# Patient Record
Sex: Female | Born: 1945
Health system: Southern US, Community
[De-identification: ages and names within clinical notes are randomized; demographics above are authoritative.]

## PROBLEM LIST (undated history)

## (undated) DIAGNOSIS — I10 Essential (primary) hypertension: Secondary | ICD-10-CM

## (undated) DIAGNOSIS — R0602 Shortness of breath: Secondary | ICD-10-CM

## (undated) DIAGNOSIS — K760 Fatty (change of) liver, not elsewhere classified: Secondary | ICD-10-CM

## (undated) DIAGNOSIS — F32A Depression, unspecified: Secondary | ICD-10-CM

## (undated) DIAGNOSIS — IMO0002 Reserved for concepts with insufficient information to code with codable children: Secondary | ICD-10-CM

## (undated) DIAGNOSIS — M797 Fibromyalgia: Secondary | ICD-10-CM

## (undated) DIAGNOSIS — N6092 Unspecified benign mammary dysplasia of left breast: Secondary | ICD-10-CM

## (undated) DIAGNOSIS — I499 Cardiac arrhythmia, unspecified: Secondary | ICD-10-CM

## (undated) DIAGNOSIS — H0520 Unspecified exophthalmos: Secondary | ICD-10-CM

## (undated) DIAGNOSIS — F419 Anxiety disorder, unspecified: Secondary | ICD-10-CM

## (undated) DIAGNOSIS — C801 Malignant (primary) neoplasm, unspecified: Secondary | ICD-10-CM

## (undated) DIAGNOSIS — L719 Rosacea, unspecified: Secondary | ICD-10-CM

## (undated) DIAGNOSIS — G629 Polyneuropathy, unspecified: Secondary | ICD-10-CM

## (undated) DIAGNOSIS — R0789 Other chest pain: Secondary | ICD-10-CM

## (undated) DIAGNOSIS — J439 Emphysema, unspecified: Secondary | ICD-10-CM

## (undated) DIAGNOSIS — T8859XA Other complications of anesthesia, initial encounter: Secondary | ICD-10-CM

## (undated) DIAGNOSIS — F329 Major depressive disorder, single episode, unspecified: Secondary | ICD-10-CM

## (undated) DIAGNOSIS — T7840XA Allergy, unspecified, initial encounter: Secondary | ICD-10-CM

## (undated) DIAGNOSIS — K589 Irritable bowel syndrome without diarrhea: Secondary | ICD-10-CM

## (undated) DIAGNOSIS — J449 Chronic obstructive pulmonary disease, unspecified: Secondary | ICD-10-CM

## (undated) DIAGNOSIS — G8929 Other chronic pain: Secondary | ICD-10-CM

## (undated) DIAGNOSIS — E785 Hyperlipidemia, unspecified: Secondary | ICD-10-CM

## (undated) DIAGNOSIS — R002 Palpitations: Secondary | ICD-10-CM

## (undated) DIAGNOSIS — C541 Malignant neoplasm of endometrium: Secondary | ICD-10-CM

## (undated) DIAGNOSIS — D0590 Unspecified type of carcinoma in situ of unspecified breast: Secondary | ICD-10-CM

## (undated) DIAGNOSIS — R0781 Pleurodynia: Secondary | ICD-10-CM

## (undated) DIAGNOSIS — M549 Dorsalgia, unspecified: Secondary | ICD-10-CM

## (undated) DIAGNOSIS — K219 Gastro-esophageal reflux disease without esophagitis: Secondary | ICD-10-CM

## (undated) DIAGNOSIS — Z1239 Encounter for other screening for malignant neoplasm of breast: Principal | ICD-10-CM

## (undated) DIAGNOSIS — E0500 Thyrotoxicosis with diffuse goiter without thyrotoxic crisis or storm: Secondary | ICD-10-CM

## (undated) DIAGNOSIS — M1990 Unspecified osteoarthritis, unspecified site: Secondary | ICD-10-CM

## (undated) DIAGNOSIS — E039 Hypothyroidism, unspecified: Secondary | ICD-10-CM

## (undated) HISTORY — DX: Rosacea, unspecified: L71.9

## (undated) HISTORY — DX: Allergy, unspecified, initial encounter: T78.40XA

## (undated) HISTORY — PX: COLECTOMY: SHX59

## (undated) HISTORY — DX: Encounter for other screening for malignant neoplasm of breast: Z12.39

## (undated) HISTORY — PX: CHOLECYSTECTOMY: SHX55

## (undated) HISTORY — PX: APPENDECTOMY: SHX54

## (undated) HISTORY — DX: Reserved for concepts with insufficient information to code with codable children: IMO0002

## (undated) HISTORY — PX: WISDOM TOOTH EXTRACTION: SHX21

## (undated) HISTORY — PX: COLON SURGERY: SHX602

## (undated) HISTORY — DX: Unspecified exophthalmos: H05.20

## (undated) HISTORY — PX: BACK SURGERY: SHX140

## (undated) HISTORY — PX: EXCISIONAL HEMORRHOIDECTOMY: SHX1541

## (undated) HISTORY — PX: JOINT REPLACEMENT: SHX530

## (undated) HISTORY — PX: HYSTEROSCOPY: SHX211

## (undated) HISTORY — DX: Emphysema, unspecified: J43.9

## (undated) HISTORY — DX: Unspecified benign mammary dysplasia of left breast: N60.92

## (undated) HISTORY — PX: POLYPECTOMY: SHX149

## (undated) HISTORY — PX: ABDOMINAL HYSTERECTOMY: SHX81

## (undated) HISTORY — DX: Fibromyalgia: M79.7

## (undated) HISTORY — PX: TONSILLECTOMY: SUR1361

## (undated) HISTORY — PX: BREAST SURGERY: SHX581

## (undated) HISTORY — PX: SPINE SURGERY: SHX786

## (undated) HISTORY — PX: DILATION AND CURETTAGE OF UTERUS: SHX78

## (undated) HISTORY — DX: Irritable bowel syndrome, unspecified: K58.9

## (undated) HISTORY — PX: EYE SURGERY: SHX253

## (undated) HISTORY — PX: LAPAROSCOPIC CHOLECYSTECTOMY: SUR755

## (undated) HISTORY — DX: Thyrotoxicosis with diffuse goiter without thyrotoxic crisis or storm: E05.00

---

## 1968-08-21 HISTORY — PX: EXCISIONAL HEMORRHOIDECTOMY: SHX1541

## 1982-08-21 HISTORY — PX: URETHROTOMY: SHX1083

## 1998-01-01 ENCOUNTER — Other Ambulatory Visit: Admission: RE | Admit: 1998-01-01 | Discharge: 1998-01-01 | Payer: Self-pay | Admitting: Obstetrics and Gynecology

## 1998-02-25 ENCOUNTER — Other Ambulatory Visit: Admission: RE | Admit: 1998-02-25 | Discharge: 1998-02-25 | Payer: Self-pay | Admitting: Obstetrics and Gynecology

## 1998-06-18 ENCOUNTER — Other Ambulatory Visit: Admission: RE | Admit: 1998-06-18 | Discharge: 1998-06-18 | Payer: Self-pay | Admitting: Obstetrics and Gynecology

## 1998-09-28 ENCOUNTER — Other Ambulatory Visit: Admission: RE | Admit: 1998-09-28 | Discharge: 1998-09-28 | Payer: Self-pay | Admitting: Obstetrics and Gynecology

## 1998-12-30 ENCOUNTER — Other Ambulatory Visit: Admission: RE | Admit: 1998-12-30 | Discharge: 1998-12-30 | Payer: Self-pay | Admitting: Obstetrics and Gynecology

## 1999-02-25 ENCOUNTER — Other Ambulatory Visit: Admission: RE | Admit: 1999-02-25 | Discharge: 1999-02-25 | Payer: Self-pay | Admitting: Obstetrics and Gynecology

## 1999-04-08 ENCOUNTER — Other Ambulatory Visit: Admission: RE | Admit: 1999-04-08 | Discharge: 1999-04-08 | Payer: Self-pay | Admitting: Obstetrics and Gynecology

## 1999-04-20 ENCOUNTER — Inpatient Hospital Stay (HOSPITAL_COMMUNITY): Admission: EM | Admit: 1999-04-20 | Discharge: 1999-04-22 | Payer: Self-pay | Admitting: Emergency Medicine

## 1999-04-21 ENCOUNTER — Encounter: Payer: Self-pay | Admitting: Cardiology

## 1999-12-30 ENCOUNTER — Ambulatory Visit (HOSPITAL_COMMUNITY): Admission: RE | Admit: 1999-12-30 | Discharge: 1999-12-30 | Payer: Self-pay | Admitting: Obstetrics and Gynecology

## 1999-12-30 ENCOUNTER — Encounter: Payer: Self-pay | Admitting: Obstetrics and Gynecology

## 1999-12-30 ENCOUNTER — Encounter (INDEPENDENT_AMBULATORY_CARE_PROVIDER_SITE_OTHER): Payer: Self-pay | Admitting: Specialist

## 2000-01-29 ENCOUNTER — Ambulatory Visit (HOSPITAL_COMMUNITY): Admission: RE | Admit: 2000-01-29 | Discharge: 2000-01-29 | Payer: Self-pay | Admitting: Endocrinology

## 2000-01-29 ENCOUNTER — Encounter: Payer: Self-pay | Admitting: Endocrinology

## 2000-03-08 ENCOUNTER — Other Ambulatory Visit: Admission: RE | Admit: 2000-03-08 | Discharge: 2000-03-08 | Payer: Self-pay | Admitting: Obstetrics and Gynecology

## 2000-03-08 ENCOUNTER — Ambulatory Visit (HOSPITAL_COMMUNITY): Admission: RE | Admit: 2000-03-08 | Discharge: 2000-03-08 | Payer: Self-pay | Admitting: Endocrinology

## 2000-03-08 ENCOUNTER — Encounter: Payer: Self-pay | Admitting: Endocrinology

## 2000-06-08 ENCOUNTER — Ambulatory Visit (HOSPITAL_COMMUNITY): Admission: RE | Admit: 2000-06-08 | Discharge: 2000-06-08 | Payer: Self-pay | Admitting: General Surgery

## 2000-06-08 ENCOUNTER — Encounter: Payer: Self-pay | Admitting: General Surgery

## 2000-06-11 ENCOUNTER — Encounter: Payer: Self-pay | Admitting: General Surgery

## 2000-06-11 ENCOUNTER — Observation Stay (HOSPITAL_COMMUNITY): Admission: RE | Admit: 2000-06-11 | Discharge: 2000-06-12 | Payer: Self-pay | Admitting: General Surgery

## 2000-06-11 ENCOUNTER — Encounter (INDEPENDENT_AMBULATORY_CARE_PROVIDER_SITE_OTHER): Payer: Self-pay

## 2001-06-18 ENCOUNTER — Other Ambulatory Visit: Admission: RE | Admit: 2001-06-18 | Discharge: 2001-06-18 | Payer: Self-pay | Admitting: Obstetrics and Gynecology

## 2001-10-25 ENCOUNTER — Encounter: Admission: RE | Admit: 2001-10-25 | Discharge: 2002-01-23 | Payer: Self-pay | Admitting: Endocrinology

## 2002-04-25 ENCOUNTER — Other Ambulatory Visit: Admission: RE | Admit: 2002-04-25 | Discharge: 2002-04-25 | Payer: Self-pay | Admitting: Obstetrics and Gynecology

## 2002-11-07 ENCOUNTER — Encounter: Payer: Self-pay | Admitting: Specialist

## 2002-11-07 ENCOUNTER — Encounter: Admission: RE | Admit: 2002-11-07 | Discharge: 2002-11-07 | Payer: Self-pay | Admitting: Specialist

## 2003-04-20 ENCOUNTER — Encounter: Payer: Self-pay | Admitting: Specialist

## 2003-04-20 ENCOUNTER — Inpatient Hospital Stay (HOSPITAL_COMMUNITY): Admission: RE | Admit: 2003-04-20 | Discharge: 2003-04-21 | Payer: Self-pay | Admitting: Specialist

## 2003-06-25 ENCOUNTER — Other Ambulatory Visit: Admission: RE | Admit: 2003-06-25 | Discharge: 2003-06-25 | Payer: Self-pay | Admitting: Obstetrics and Gynecology

## 2003-08-06 ENCOUNTER — Encounter: Admission: RE | Admit: 2003-08-06 | Discharge: 2003-08-06 | Payer: Self-pay | Admitting: Specialist

## 2004-06-27 ENCOUNTER — Other Ambulatory Visit: Admission: RE | Admit: 2004-06-27 | Discharge: 2004-06-27 | Payer: Self-pay | Admitting: Obstetrics and Gynecology

## 2005-07-03 ENCOUNTER — Other Ambulatory Visit: Admission: RE | Admit: 2005-07-03 | Discharge: 2005-07-03 | Payer: Self-pay | Admitting: Obstetrics and Gynecology

## 2005-12-20 ENCOUNTER — Encounter: Payer: Self-pay | Admitting: Specialist

## 2006-08-21 HISTORY — PX: RIGHT COLECTOMY: SHX853

## 2006-11-05 ENCOUNTER — Other Ambulatory Visit: Admission: RE | Admit: 2006-11-05 | Discharge: 2006-11-05 | Payer: Self-pay | Admitting: Obstetrics and Gynecology

## 2007-04-14 ENCOUNTER — Emergency Department (HOSPITAL_COMMUNITY): Admission: EM | Admit: 2007-04-14 | Discharge: 2007-04-14 | Payer: Self-pay | Admitting: Emergency Medicine

## 2007-11-11 ENCOUNTER — Other Ambulatory Visit: Admission: RE | Admit: 2007-11-11 | Discharge: 2007-11-11 | Payer: Self-pay | Admitting: Obstetrics and Gynecology

## 2008-02-20 ENCOUNTER — Encounter: Admission: RE | Admit: 2008-02-20 | Discharge: 2008-02-20 | Payer: Self-pay | Admitting: Radiology

## 2008-12-08 ENCOUNTER — Ambulatory Visit: Payer: Self-pay | Admitting: Occupational Medicine

## 2008-12-08 DIAGNOSIS — E039 Hypothyroidism, unspecified: Secondary | ICD-10-CM | POA: Insufficient documentation

## 2008-12-08 DIAGNOSIS — E785 Hyperlipidemia, unspecified: Secondary | ICD-10-CM | POA: Insufficient documentation

## 2008-12-08 DIAGNOSIS — I10 Essential (primary) hypertension: Secondary | ICD-10-CM | POA: Insufficient documentation

## 2010-03-03 ENCOUNTER — Encounter: Admission: RE | Admit: 2010-03-03 | Discharge: 2010-03-03 | Payer: Self-pay | Admitting: Radiology

## 2010-04-08 ENCOUNTER — Ambulatory Visit: Payer: Self-pay | Admitting: Oncology

## 2010-05-17 ENCOUNTER — Encounter: Payer: Self-pay | Admitting: Emergency Medicine

## 2010-05-17 ENCOUNTER — Ambulatory Visit: Payer: Self-pay | Admitting: Family Medicine

## 2010-05-17 DIAGNOSIS — J209 Acute bronchitis, unspecified: Secondary | ICD-10-CM | POA: Insufficient documentation

## 2010-05-17 DIAGNOSIS — J069 Acute upper respiratory infection, unspecified: Secondary | ICD-10-CM | POA: Insufficient documentation

## 2010-05-17 LAB — CONVERTED CEMR LAB: Rapid Strep: NEGATIVE

## 2010-05-19 ENCOUNTER — Ambulatory Visit: Payer: Self-pay | Admitting: Oncology

## 2010-05-20 ENCOUNTER — Telehealth (INDEPENDENT_AMBULATORY_CARE_PROVIDER_SITE_OTHER): Payer: Self-pay

## 2010-08-05 ENCOUNTER — Ambulatory Visit: Payer: Self-pay | Admitting: Oncology

## 2010-08-08 LAB — CBC WITH DIFFERENTIAL/PLATELET
BASO%: 0.2 % (ref 0.0–2.0)
EOS%: 0.1 % (ref 0.0–7.0)
HGB: 13.9 g/dL (ref 11.6–15.9)
LYMPH%: 22.9 % (ref 14.0–49.7)
MCH: 33.1 pg (ref 25.1–34.0)
MCHC: 34 g/dL (ref 31.5–36.0)
MCV: 97.6 fL (ref 79.5–101.0)
MONO#: 0.2 10*3/uL (ref 0.1–0.9)
MONO%: 3 % (ref 0.0–14.0)
NEUT#: 6 10*3/uL (ref 1.5–6.5)
Platelets: 214 10*3/uL (ref 145–400)
RBC: 4.19 10*6/uL (ref 3.70–5.45)
WBC: 8.1 10*3/uL (ref 3.9–10.3)

## 2010-08-08 LAB — COMPREHENSIVE METABOLIC PANEL
ALT: 29 U/L (ref 0–35)
Albumin: 4.6 g/dL (ref 3.5–5.2)
Alkaline Phosphatase: 135 U/L — ABNORMAL HIGH (ref 39–117)
CO2: 26 mEq/L (ref 19–32)
Creatinine, Ser: 0.97 mg/dL (ref 0.40–1.20)
Potassium: 4.5 mEq/L (ref 3.5–5.3)
Total Bilirubin: 0.4 mg/dL (ref 0.3–1.2)

## 2010-09-22 NOTE — Letter (Signed)
Summary: CONTROLLED MED PRESCRIPTION POLICY  CONTROLLED MED PRESCRIPTION POLICY   Imported By: Dannette Barbara 05/17/2010 14:40:44  _____________________________________________________________________  External Attachment:    Type:   Image     Comment:   External Document

## 2010-09-22 NOTE — Progress Notes (Signed)
  Phone Note Outgoing Call   Call placed by: Areta Haber CMA,  May 20, 2010 5:01 PM Call placed to: Patient Summary of Call: Courtesy call made to pt - Pt states she's starting to feel a little better. Pt was advise RSS was negative and no culture was requested by Physician. Pt was advised to F/U with her PCP if Sx persist per her discharge instructions. Initial call taken by: Areta Haber CMA,  May 20, 2010 5:06 PM

## 2010-09-22 NOTE — Assessment & Plan Note (Signed)
Summary: COUGH,EAR PAIN SORE THROAT/TJ 2   Vital Signs:  Patient Profile:   65 Years Old Female CC:      sore throat, wheezing w/ tightness in chest, H/A x 1 week. Height:     66 inches Weight:      196.50 pounds O2 Sat:      92 % O2 treatment:    Room Air Temp:     98.6 degrees F oral Pulse rate:   70 / minute Resp:     20 per minute BP sitting:   100 / 66  (right arm)  Pt. in pain?   yes    Location:   chest    Intensity:   4    Type:       tightness  Vitals Entered By: Lajean Saver RN (May 17, 2010 1:36 PM)                   Updated Prior Medication List: EFFEXOR 37.5 MG TABS (VENLAFAXINE HCL)  LYRICA 100 MG CAPS (PREGABALIN)  NIASPAN 1000 MG CR-TABS (NIACIN (ANTIHYPERLIPIDEMIC))  TESSALON PERLES 100 MG CAPS (BENZONATATE) one tabe three times a day as needed for cough LOVAZA 1 GM CAPS (OMEGA-3-ACID ETHYL ESTERS) 2 once daily * ESTROGEN AND PROGESTERONE PATCH 1000MG   * ARMOR THYROID (NATURAL) 120 MG. Once daily.  Current Allergies: ! CODEINE ! SULFA ! AUGMENTIN ! NSAIDSHistory of Present Illness Chief Complaint: sore throat, wheezing w/ tightness in chest, H/A x 1 week. History of Present Illness:  Subjective: Patient developed URI symptoms one week ago and generally improved until this morning when she developed wheezing and anterior chest discomfort.  She quit smoking about 4 months ago. + cough worse at night No pleuritic pain + nasal congestion + post-nasal drainage No sinus pain/pressure No itchy/red eyes No earache No hemoptysis No SOB No fever; + chills last night No nausea No vomiting No abdominal pain No diarrhea No skin rashes No fatigue No myalgias No headache Used OTC meds without relief   REVIEW OF SYSTEMS Constitutional Symptoms      Denies fever, chills, night sweats, weight loss, weight gain, and fatigue.  Eyes       Denies change in vision, eye pain, eye discharge, glasses, contact lenses, and eye  surgery. Ear/Nose/Throat/Mouth       Complains of ear pain, sore throat, and hoarseness.      Denies hearing loss/aids, change in hearing, ear discharge, dizziness, frequent runny nose, frequent nose bleeds, sinus problems, and tooth pain or bleeding.  Respiratory       Complains of productive cough and wheezing.      Denies dry cough, shortness of breath, asthma, bronchitis, and emphysema/COPD.  Cardiovascular       Denies murmurs, chest pain, and tires easily with exhertion.    Gastrointestinal       Denies stomach pain, nausea/vomiting, diarrhea, constipation, blood in bowel movements, and indigestion. Genitourniary       Denies painful urination, kidney stones, and loss of urinary control. Neurological       Denies paralysis, seizures, and fainting/blackouts. Musculoskeletal       Denies muscle pain, joint pain, joint stiffness, decreased range of motion, redness, swelling, muscle weakness, and gout.  Skin       Denies bruising, unusual mles/lumps or sores, and hair/skin or nail changes.  Psych       Denies mood changes, temper/anger issues, anxiety/stress, speech problems, depression, and sleep problems.  Past History:  Past Medical History:  Reviewed history from 12/08/2008 and no changes required. Hyperlipidemia Hypertension Hypothyroidism Chronic Fatigue Syndrome Fibromyalgia  Past Surgical History: Reviewed history from 12/08/2008 and no changes required. Cholecystectomy Colectomy Lumbar laminectomy  Family History: Reviewed history from 12/08/2008 and no changes required. Mother ARDS Father MI Brother HTN DM Sister HTN  Social History: Current Smoker Regular exercise-no lives with sister   Objective:  No acute distress  Eyes:  Pupils are equal, round, and reactive to light and accomdation.  Extraocular movement is intact.  Conjunctivae are not inflamed.  Ears:  Canals normal.  Tympanic membranes normal.   Nose:  Normal septum.  Normal turbinates, mildly  congested.   No sinus tenderness present.  Pharynx:  Normal  Neck:  Supple.  No adenopathy is present.  No thyromegaly is present  Lungs:  Diffuse bibasilar wheezes/rhonchi.   Breath sounds are equal.  Heart:  Regular rate and rhythm without murmurs, rubs, or gallops.  Abdomen:  Nontender without masses or hepatosplenomegaly.  Bowel sounds are present.  No CVA or flank tenderness.  Extremities:  No edema.  Pedal pulses are full and equal.  Chest X-ray: Comparison: Two-view chest x-ray 04/14/2007 and CT chest of 08/06/2003.   Findings: Cardiac silhouette normal in size, unchanged.  Thoracic aorta mildly tortuous, unchanged.  Hilar and mediastinal contours otherwise unremarkable.  Mildly prominent bronchovascular markings and central peribronchial thickening, more so than on the prior examination.  No localized airspace consolidation.  No pleural effusions.  Mild degenerative changes involving the thoracic spine.   IMPRESSION: Mild changes of acute bronchitis and/or asthma without localized airspace pneumonia.  Assessment New Problems: ACUTE BRONCHITIS (ICD-466.0) UPPER RESPIRATORY INFECTION (ICD-465.9)   Plan New Medications/Changes: BENZONATATE 200 MG CAPS (BENZONATATE) One by mouth hs as needed cough  #12 x 0, 05/17/2010, Donna Christen MD CEFDINIR 300 MG CAPS (CEFDINIR) 1 by mouth q12hr  #20 x 0, 05/17/2010, Donna Christen MD  New Orders: T-Chest x-ray, 2 views [71020] Est. Patient Level III [99213] Planning Comments:   Begin Omnicef, expectorant, cough suppressant at bedtime. Follow-up with PCP if not improving.   The patient and/or caregiver has been counseled thoroughly with regard to medications prescribed including dosage, schedule, interactions, rationale for use, and possible side effects and they verbalize understanding.  Diagnoses and expected course of recovery discussed and will return if not improved as expected or if the condition worsens. Patient and/or  caregiver verbalized understanding.  Prescriptions: BENZONATATE 200 MG CAPS (BENZONATATE) One by mouth hs as needed cough  #12 x 0   Entered and Authorized by:   Donna Christen MD   Signed by:   Donna Christen MD on 05/17/2010   Method used:   Print then Give to Patient   RxID:   [id number] CEFDINIR 300 MG CAPS (CEFDINIR) 1 by mouth q12hr  #20 x 0   Entered and Authorized by:   Donna Christen MD   Signed by:   Donna Christen MD on 05/17/2010   Method used:   Print then Give to Patient   RxID:   [id number]   Patient Instructions: 1)  Take plain Mucinex (guaifenesin) twice daily with plenty of fluids for cough. 2)  Follow-up with family doctor if not improving 7 to 10 days.  Orders Added: 1)  T-Chest x-ray, 2 views [71020] 2)  Est. Patient Level III [99213]  Laboratory Results  Date/Time Received: May 17, 2010 2:06 PM  Date/Time Reported: May 17, 2010 2:06 PM   Other Tests  Rapid Strep: negative  Appended Document: COUGH,EAR PAIN SORE THROAT/TJ 2 Rapid strep test negative

## 2011-01-06 NOTE — Op Note (Signed)
NAMEAALEIGHA, BOZZA NO.:  [id number]   MEDICAL RECORD NO.:  [id number]                   PATIENT TYPE:  INP   LOCATION:  NA                                   FACILITY:  [facility]   PHYSICIAN:  Kerrin Champagne, M.D.                DATE OF BIRTH:  May 07, 1946   DATE OF PROCEDURE:  04/20/2003  DATE OF DISCHARGE:                                 OPERATIVE REPORT   PREOPERATIVE DIAGNOSIS:  Bilateral lateral recess stenosis, L4-5.   POSTOPERATIVE DIAGNOSIS:  Bilateral lateral recess stenosis, L4-5.   OPERATION PERFORMED:  Bilateral lateral recess decompression, L4-5.   SURGEON:  Kerrin Champagne, M.D.   ANESTHESIA:  General orotracheal anesthesia.   ANESTHESIOLOGIST:  Maren Beach, M.D.   ESTIMATED BLOOD LOSS:  25mL.   DRAINS:  None.   INDICATIONS FOR PROCEDURE:  The patient is a 65 year old female who has had  a long history of back pain.  She has developed progressive worsening of leg  pain, worse with standing and walking.  She underwent attempts at  conservative management including steroid injections without relief of pain.  Her studies involving MRI primarily have demonstrated bilateral subarticular  lateral recess stenosis in the L4-5 level.  Her pain pattern is that of L5.  After failing conservative management, she was brought to the operating room  to undergo bilateral lateral recess decompression preserving posterior  elements.   INTRAOPERATIVE FINDINGS:  Bilateral hypertrophic changes involving the  ligamentum flavum and the medial aspects of the L4-5 facet.  No sign of  herniated nucleus pulposus or significant instability.   DESCRIPTION OF PROCEDURE:  After adequate general anesthesia, the patient in  knee chest position, Andrews frame, standard preop antibiotics, standard  prep with Hibiclens solution, all pressure points well padded.  Note that  DuraPrep was not used, Hibiclens was used due to the patient's iodine  allergy.   Standard drape was used with a clear Vi-drape.  Incision  approximately 2-1/2 inches in length extending from the L5 level to L3.  Through the skin and subcutaneous layers using a 10 blade scalpel after  infiltration with Marcaine 0.5% with 1:200,000 epinephrine, incision carried  sharply down to the lumbodorsal fascia.  Bleeders controlled using  electrocautery.  Lumbodorsal fascia incised on both sides of the expected L4-  L5 spinous process.  Clamp placed on the L4 spinous process.  An  intraoperative radiograph demonstrated this to be on the fourth spinous  process.   Cobbs then used to elevate the paralumbar muscles bilaterally exposing the  posterior aspect of the interlaminar space at the L4-5 level.  McCullough  retractor was inserted.  A small portion of the inferior aspect of the  lamina at L4 was resected bilaterally and ligamentum flavum then carefully  freed off the undersurface of the lamina of L4 bilaterally and off the  medial aspect of  the facet at the L4-5 level.  An FS2 microcuret used to  carefully free the attachment of the ligamentum flavum off the superior  aspect of the L5 lamina.  A 3 and 4 mm Kerrison then used to debride the  ligamentous attachment at the superior aspect of L5 bilaterally and then  resecting the ligamentum flavum off the medial aspect of the facet at the L4-  5 level at both sides.  Intraoperative draping of the microscope performed  sterilely.  It was brought into the field under direct observation, then  continued lateral recess decompression was carried out with resection.  Approximately 10% of the medial aspect of the facet bilaterally and  debriding the ligamentum flavum in its entirety.  Foraminotomy performed  over both L5 nerve roots.  There was indentation of the L5 nerve root  secondary to the hypertrophic joint changes and ligamentum flavum changes  noted.  With this then, a hockey stick nerve probe was passed out the L4, L5  neural  foramina bilaterally demonstrating wide patency here.  However, the  posterior aspect of the disk at the L4-5 level demonstrated no sign of disk  protrusion or significant mass effect.  Irrigation performed, bleeding  controlled using bipolar electrocautery.  Thrombin-soaked Gelfoam.  Bone wax  applied to the bleeding cancellous bone surfaces.  Excess bone wax removed.  Excess Thrombin soaked Gelfoam was removed.  A small portion of Gelfoam  placed over the posterior aspect of the laminotomy site at the L4-5 level  bilaterally.  Soft tissue was allowed to back into space.  There was no  evidence of active bleeding evident.  Lumbodorsal fascia approximated with  interrupted #1 Vicryl sutures, deep subcu layers with interrupted 0 Vicryl  sutures, more superficial layers with interrupted 2-0 Vicryl sutures and the  skin closed with a running subcu stitch of 4-0 Vicryl.  Steri-Strips were  applied, 4 x 4s were fixed to the skin with paper tape.  The patient was  then reactivated and returned to the recovery room in satisfactory  condition.  Sponge and instrument counts were correct.                                                Kerrin Champagne, M.D.    Myra Rude  D:  04/20/2003  T:  04/20/2003  Job:  [id number]

## 2011-03-30 ENCOUNTER — Other Ambulatory Visit: Payer: Self-pay | Admitting: Specialist

## 2011-03-30 ENCOUNTER — Ambulatory Visit
Admission: RE | Admit: 2011-03-30 | Discharge: 2011-03-30 | Disposition: A | Discharge status: Home / Self Care | Payer: Medicare Other | Source: Ambulatory Visit | Attending: Specialist | Admitting: Specialist

## 2011-03-30 DIAGNOSIS — R05 Cough: Secondary | ICD-10-CM

## 2011-03-30 DIAGNOSIS — R059 Cough, unspecified: Secondary | ICD-10-CM

## 2011-03-30 DIAGNOSIS — R52 Pain, unspecified: Secondary | ICD-10-CM

## 2011-04-27 NOTE — Progress Notes (Signed)
Pt states she is going to have to reschedule surgery due to insurance changes in carrier.  Will need pre-op appt when surgery is rescheduled.

## 2011-05-10 MED ORDER — FENTANYL CITRATE 0.05 MG/ML IJ SOLN
INTRAMUSCULAR | Status: AC
  Filled 2011-05-10: qty 2

## 2011-05-24 NOTE — Patient Instructions (Addendum)
   Your procedure is scheduled on: 05/29/2011 Monday  Enter through the Main Entrance of [facility] at: 8am Pick up the phone at the desk and dial [phone] and inform us of your arrival  Please call this number if you have any problems the morning of surgery: [phone]  Remember: Do not eat food after midnight  Sunday, Oct. 7th Do not drink clear liquids after:midnight  Oct. 7th Take these medicines the morning of surgery with a SIP OF WATER:  Per anesthesia instructions  Do not wear jewelry, make-up, or FINGER nail polish Do not wear lotions, powders, or perfumes.  Do not shave for 48 hours prior to surgery Do not bring valuables to the hospital. Patients discharged on the day of surgery will not be allowed to drive home.   Name and phone number of your driver:  sister in law  Johnny Bridge  [phone]   Remember to use your hibiclens as instructed.Please shower with 1/2 bottle the evening before your surgery and the other 1/2 bottle the morning of surgery.

## 2011-05-26 ENCOUNTER — Encounter (HOSPITAL_COMMUNITY)
Admission: RE | Admit: 2011-05-26 | Discharge: 2011-05-26 | Disposition: A | Discharge status: Home / Self Care | Payer: Medicare Other | Source: Ambulatory Visit | Attending: Obstetrics and Gynecology | Admitting: Obstetrics and Gynecology

## 2011-05-26 ENCOUNTER — Encounter (HOSPITAL_COMMUNITY): Payer: Self-pay

## 2011-05-26 ENCOUNTER — Other Ambulatory Visit: Payer: Self-pay

## 2011-05-26 HISTORY — DX: Malignant (primary) neoplasm, unspecified: C80.1

## 2011-05-26 HISTORY — DX: Dorsalgia, unspecified: M54.9

## 2011-05-26 HISTORY — DX: Hypothyroidism, unspecified: E03.9

## 2011-05-26 HISTORY — DX: Depression, unspecified: F32.A

## 2011-05-26 HISTORY — DX: Other chronic pain: G89.29

## 2011-05-26 HISTORY — DX: Polyneuropathy, unspecified: G62.9

## 2011-05-26 HISTORY — DX: Shortness of breath: R06.02

## 2011-05-26 HISTORY — DX: Anxiety disorder, unspecified: F41.9

## 2011-05-26 HISTORY — DX: Gastro-esophageal reflux disease without esophagitis: K21.9

## 2011-05-26 HISTORY — DX: Hyperlipidemia, unspecified: E78.5

## 2011-05-26 HISTORY — DX: Palpitations: R00.2

## 2011-05-26 HISTORY — DX: Unspecified osteoarthritis, unspecified site: M19.90

## 2011-05-26 HISTORY — DX: Major depressive disorder, single episode, unspecified: F32.9

## 2011-05-26 HISTORY — DX: Chronic obstructive pulmonary disease, unspecified: J44.9

## 2011-05-26 LAB — CBC
HCT: 42 % (ref 36.0–46.0)
Platelets: 226 10*3/uL (ref 150–400)
RBC: 4.19 MIL/uL (ref 3.87–5.11)

## 2011-05-26 NOTE — Anesthesia Preprocedure Evaluation (Addendum)
Anesthesia Evaluation  Name, MR# and DOB Patient awake  General Assessment Comment  Reviewed: Allergy & Precautions, H&P , Patient's Chart, lab work & pertinent test results, reviewed documented beta blocker date and time   History of Anesthesia Complications Negative for: history of anesthetic complications  Airway Mallampati: II TM Distance: >3 FB Neck ROM: full    Dental No notable dental hx.    Pulmonary (+) shortness of breath and With exertion COPDCurrent Smoker  clear to auscultation  Pulmonary exam normal       Cardiovascular Exercise Tolerance: Good METS: 5 - 7 Mets hypertensionregular Normal    Neuro/Psych PSYCHIATRIC DISORDERS Anxiety Depression  Neuromuscular disease (peripheral neuropathy - legs below knee) Negative Neurological ROS  Negative Psych ROS   GI/Hepatic negative GI ROS Neg liver ROS  (+) Cirrhosis - (former heavy drinker - 20 years ago)       ,   Endo/Other  Negative Endocrine ROSHypothyroidism   Renal/GU negative Renal ROS     Musculoskeletal  (+) Arthritis - (right knee), Osteoarthritis,  Fibromyalgia -  Abdominal   Peds  Hematology negative hematology ROS (+)   Anesthesia Other Findings   Reproductive/Obstetrics negative OB ROS                         Anesthesia Physical Anesthesia Plan  ASA: III  Anesthesia Plan: General   Post-op Pain Management:    Induction: Intravenous  Airway Management Planned: LMA  Additional Equipment:   Intra-op Plan:   Post-operative Plan:   Informed Consent: I have reviewed the patients History and Physical, chart, labs and discussed the procedure including the risks, benefits and alternatives for the proposed anesthesia with the patient or authorized representative who has indicated his/her understanding and acceptance.   Dental Advisory Given  Plan Discussed with: CRNA and Surgeon  Anesthesia Plan Comments: (Patient  wants to be positioned awake. Check BMP on day of surgery)       Anesthesia Quick Evaluation

## 2011-05-26 NOTE — Pre-Procedure Instructions (Signed)
Dr Brayton Caves to see patient after pre-op visit.  EKG on chart.  Stat BMP DOS per verbal order Dr Brayton Caves.  BMP in computer.

## 2011-05-29 ENCOUNTER — Encounter (HOSPITAL_COMMUNITY)
Admission: RE | Disposition: A | Discharge status: Home / Self Care | Payer: Self-pay | Source: Ambulatory Visit | Attending: Obstetrics and Gynecology

## 2011-05-29 ENCOUNTER — Encounter (HOSPITAL_COMMUNITY): Payer: Self-pay | Admitting: *Deleted

## 2011-05-29 ENCOUNTER — Ambulatory Visit (HOSPITAL_COMMUNITY): Payer: Medicare Other | Admitting: Anesthesiology

## 2011-05-29 ENCOUNTER — Other Ambulatory Visit: Payer: Self-pay | Admitting: Obstetrics and Gynecology

## 2011-05-29 ENCOUNTER — Encounter (HOSPITAL_COMMUNITY): Payer: Self-pay | Admitting: Anesthesiology

## 2011-05-29 ENCOUNTER — Ambulatory Visit (HOSPITAL_COMMUNITY)
Admission: RE | Admit: 2011-05-29 | Discharge: 2011-05-29 | Disposition: A | Discharge status: Home / Self Care | Payer: Medicare Other | Source: Ambulatory Visit | Attending: Obstetrics and Gynecology | Admitting: Obstetrics and Gynecology

## 2011-05-29 DIAGNOSIS — Z01818 Encounter for other preprocedural examination: Secondary | ICD-10-CM | POA: Insufficient documentation

## 2011-05-29 DIAGNOSIS — Z01812 Encounter for preprocedural laboratory examination: Secondary | ICD-10-CM | POA: Insufficient documentation

## 2011-05-29 DIAGNOSIS — N950 Postmenopausal bleeding: Secondary | ICD-10-CM | POA: Insufficient documentation

## 2011-05-29 HISTORY — PX: HYSTEROSCOPY WITH D & C: SHX1775

## 2011-05-29 LAB — BASIC METABOLIC PANEL
Chloride: 103 mEq/L (ref 96–112)
Creatinine, Ser: 0.61 mg/dL (ref 0.50–1.10)
GFR calc Af Amer: >90 mL/min (ref >90)
Potassium: 4.1 mEq/L (ref 3.5–5.1)

## 2011-05-29 SURGERY — DILATATION AND CURETTAGE /HYSTEROSCOPY
Anesthesia: General | Site: Uterus | Wound class: Clean Contaminated

## 2011-05-29 MED ORDER — DEXTROSE IN LACTATED RINGERS 5 % IV SOLN: INTRAVENOUS | Status: DC

## 2011-05-29 MED ORDER — FENTANYL CITRATE 0.05 MG/ML IJ SOLN
25.0000 ug | INTRAMUSCULAR | Status: DC | PRN
  Administered 2011-05-29 (×2): 50 ug via INTRAVENOUS

## 2011-05-29 MED ORDER — GLYCINE 1.5 % IR SOLN
Status: DC | PRN
  Administered 2011-05-29: 3000 mL

## 2011-05-29 MED ORDER — FENTANYL CITRATE 0.05 MG/ML IJ SOLN
INTRAMUSCULAR | Status: AC
  Filled 2011-05-29: qty 2

## 2011-05-29 MED ORDER — LIDOCAINE HCL (CARDIAC) 20 MG/ML IV SOLN
INTRAVENOUS | Status: DC | PRN
  Administered 2011-05-29: 50 mg via INTRAVENOUS

## 2011-05-29 MED ORDER — MIDAZOLAM HCL 5 MG/5ML IJ SOLN
INTRAMUSCULAR | Status: DC | PRN
  Administered 2011-05-29: 1 mg via INTRAVENOUS

## 2011-05-29 MED ORDER — OXYCODONE-ACETAMINOPHEN 5-325 MG PO TABS: 1.0000 | ORAL_TABLET | ORAL | Status: DC | PRN

## 2011-05-29 MED ORDER — EPHEDRINE 5 MG/ML INJ
INTRAVENOUS | Status: AC
  Filled 2011-05-29: qty 10

## 2011-05-29 MED ORDER — LACTATED RINGERS IV SOLN
INTRAVENOUS | Status: DC
  Administered 2011-05-29 (×2): via INTRAVENOUS

## 2011-05-29 MED ORDER — ONDANSETRON HCL 4 MG/2ML IJ SOLN
INTRAMUSCULAR | Status: DC | PRN
  Administered 2011-05-29: 4 mg via INTRAVENOUS

## 2011-05-29 MED ORDER — PROMETHAZINE HCL 25 MG/ML IJ SOLN: 12.5000 mg | INTRAMUSCULAR | Status: DC | PRN

## 2011-05-29 MED ORDER — ONDANSETRON HCL 4 MG/2ML IJ SOLN
INTRAMUSCULAR | Status: AC
  Filled 2011-05-29: qty 2

## 2011-05-29 MED ORDER — EPHEDRINE SULFATE 50 MG/ML IJ SOLN
INTRAMUSCULAR | Status: DC | PRN
  Administered 2011-05-29: 10 mg via INTRAVENOUS

## 2011-05-29 MED ORDER — PROPOFOL 10 MG/ML IV EMUL
INTRAVENOUS | Status: AC
  Filled 2011-05-29: qty 20

## 2011-05-29 MED ORDER — PROPOFOL 10 MG/ML IV EMUL
INTRAVENOUS | Status: DC | PRN
  Administered 2011-05-29: 150 mg via INTRAVENOUS

## 2011-05-29 MED ORDER — FENTANYL CITRATE 0.05 MG/ML IJ SOLN
INTRAMUSCULAR | Status: DC | PRN
  Administered 2011-05-29 (×2): 50 ug via INTRAVENOUS

## 2011-05-29 MED ORDER — MEPERIDINE HCL 25 MG/ML IJ SOLN
INTRAMUSCULAR | Status: AC
  Administered 2011-05-29: 25 mg
  Filled 2011-05-29: qty 1

## 2011-05-29 MED ORDER — MIDAZOLAM HCL 2 MG/2ML IJ SOLN
INTRAMUSCULAR | Status: AC
  Filled 2011-05-29: qty 2

## 2011-05-29 MED ORDER — LIDOCAINE HCL (CARDIAC) 20 MG/ML IV SOLN
INTRAVENOUS | Status: AC
  Filled 2011-05-29: qty 5

## 2011-05-29 MED ORDER — LIDOCAINE HCL 1 % IJ SOLN
INTRAMUSCULAR | Status: DC | PRN
  Administered 2011-05-29: 10 mL

## 2011-05-29 SURGICAL SUPPLY — 18 items
CANISTER SUCTION 2500CC (MISCELLANEOUS) ×2 IMPLANT
CATH ROBINSON RED A/P 16FR (CATHETERS) ×2 IMPLANT
CONTAINER PREFILL 10% NBF 60ML (FORM) ×4 IMPLANT
CORD ACTIVE DISPOSABLE (ELECTRODE)
CORD ELECTRO ACTIVE DISP (ELECTRODE) IMPLANT
ELECT LOOP GYNE PRO 24FR (CUTTING LOOP)
ELECT REM PT RETURN 9FT ADLT (ELECTROSURGICAL)
ELECT VAPORTRODE GRVD BAR (ELECTRODE) IMPLANT
ELECTRODE LOOP GYNE PRO 24FR (CUTTING LOOP) IMPLANT
ELECTRODE REM PT RTRN 9FT ADLT (ELECTROSURGICAL) IMPLANT
GLOVE BIOGEL PI IND STRL 7.0 (GLOVE) ×2 IMPLANT
GLOVE BIOGEL PI INDICATOR 7.0 (GLOVE) ×2
GLOVE ECLIPSE 6.5 STRL STRAW (GLOVE) ×2 IMPLANT
GOWN PREVENTION PLUS LG XLONG (DISPOSABLE) ×4 IMPLANT
LOOP ANGLED CUTTING 22FR (CUTTING LOOP) IMPLANT
PACK HYSTEROSCOPY LF (CUSTOM PROCEDURE TRAY) ×2 IMPLANT
TOWEL OR 17X24 6PK STRL BLUE (TOWEL DISPOSABLE) ×4 IMPLANT
WATER STERILE IRR 1000ML POUR (IV SOLUTION) ×2 IMPLANT

## 2011-05-29 NOTE — H&P (Signed)
Shelly Mosley is an 65 y.o. female who had an endometrial biopsy for post meno bleeding which revealed glandular crowding and necrosis.  Having a D & C to obtain a more complete sampling of the cavity.    No LMP recorded.    Past Medical History  Diagnosis Date  . Cancer     colectomy for precancer cell  . Chronic kidney disease     hx cirrhosis of liver  . Depression   . Hypothyroidism   . COPD (chronic obstructive pulmonary disease)   . Neuropathy, peripheral   . Current smoker   . Palpitations   . Hyperlipidemia   . Shortness of breath     with exercise  . GERD (gastroesophageal reflux disease)     occ. tums  . Anxiety   . Arthritis     right knee  . Chronic back pain greater than 3 months duration     Past Surgical History  Procedure Date  . Dilation and curettage of uterus   . Cholecystectomy   . Back surgery     L4-L5, 03/2003  . Tonsillectomy   . Wisdom tooth extraction   . Right colectomy   . Colonscopy     History reviewed. No pertinent family history.  Social History:  reports that she has been smoking Cigarettes.  She has a 21 pack-year smoking history. She has never used smokeless tobacco. She reports that she does not drink alcohol or use illicit drugs.  Allergies:  Allergies  Allergen Reactions  . Codeine Swelling    Swollen lips.  Pt has taken vicoden w/o problems  . HYQ:MVHQIONGEXB+MWUXLKGMW+NUUVOZDGUY Acid+Aspartame Diarrhea    Pt had bad diarrhea.  . Sulfonamide Derivatives   . Nsaids Rash    Rash and itching.    Prescriptions prior to admission  Medication Sig Dispense Refill  . CALCIUM CITRATE PO Take 1 tablet by mouth 2 (two) times daily.        . cholecalciferol (VITAMIN D) 1000 UNITS tablet Take 1,000 Units by mouth 2 (two) times daily.        Marland Kitchen estradiol (CLIMARA - DOSED IN MG/24 HR) 0.05 mg/24hr Place 1 patch onto the skin once a week. Changes patch every Wednesday.       . hydroxypropyl cellulose (LACRISERT) 5 MG INST Place 5 mg  into both eyes at bedtime.        . lidocaine (LIDODERM) 5 % Place 1 patch onto the skin daily. Remove & Discard patch within 12 hours or as directed by MD To left rib cage.       Marland Kitchen LORazepam (ATIVAN) 0.5 MG tablet Take 0.5 mg by mouth as needed. For anxiety         . Multiple Vitamin (MULTIVITAMIN) tablet Take 1 tablet by mouth daily.        . niacin (NIASPAN) 1000 MG CR tablet Take 2,000 mg by mouth at bedtime.        Marland Kitchen omega-3 acid ethyl esters (LOVAZA) 1 G capsule Take 2 g by mouth 2 (two) times daily.        . pregabalin (LYRICA) 100 MG capsule Take 100 mg by mouth 3 (three) times daily.        . progesterone (PROMETRIUM) 100 MG capsule Take 100 mg by mouth at bedtime.        Marland Kitchen thyroid (ARMOUR) 60 MG tablet Take 120 mg by mouth daily.        . traMADol (ULTRAM) 50 MG tablet  Take 50 mg by mouth every 8 (eight) hours.        Marland Kitchen venlafaxine (EFFEXOR) 25 MG tablet Take 62.5 mg by mouth daily.          Review of Systems  All other systems reviewed and are negative.    Blood pressure 116/70, pulse 78, temperature 97.9 F (36.6 C), temperature source Oral, resp. rate 20, SpO2 100.00%. Physical Exam  Constitutional: She is oriented to person, place, and time. She appears well-developed and well-nourished.  HENT:  Head: Normocephalic and atraumatic.  Eyes: Pupils are equal, round, and reactive to light.  Neck: Normal range of motion.  Cardiovascular: Normal rate and regular rhythm.   Respiratory: Effort normal.  GI: Soft. Bowel sounds are normal.  Genitourinary: Vagina normal and uterus normal.  Musculoskeletal: Normal range of motion.  Neurological: She is alert and oriented to person, place, and time.  Skin: Skin is warm and dry.  Psychiatric: She has a normal mood and affect.    No results found for this or any previous visit (from the past 24 hour(s)).  No results found.  Assessment/Plan: PMB, for D & C  Lucinda Spells P 05/29/2011, 9:23 AM

## 2011-05-29 NOTE — Op Note (Signed)
Preoperative diagnosis: Postmenopausal bleeding Postoperative diagnosis: Same path pending Procedure: Hysteroscopy D&C Anesthesia: Gen. by LMA Estimated blood loss: Minimal Glycine deficit: Essentially 0 Complications: None Procedure: The patient was taken to the operating room and after induction of adequate general anesthesia by LMA was placed in the low dorsolithotomy position and prepped and draped in the usual fashion.  A red rubber catheter was used to drain the bladder.  A Graves speculum was inserted into the vagina and the cervix was visualized.  A tenaculum was placed on the anterior lip.  A paracervical block was instituted by injecting 10 cc of 1% lidocaine at each of 3 and 9:00.  The uterus then sounded to 8 cm.  The cervix was dilated to a 21 Pratt.  Diagnostic hysteroscope was then inserted into the fundus using glycine as the distention medium with the pressure pump set at 80 mm of mercury.  The uterus was known to have a slight arcuate pattern and this was very mild on hysteroscopy.  The cavity didn't appear clean.  Photographic documentation was taken.  The scope was withdrawn and a #1 curet was used to affect a gentle curettage throughout the cavity.  Very scant specimen was obtained.  The specimen was sent to pathology.  Hysteroscopy was then repeated and again the cavity appeared clean without lesion.  The scope was withdrawn and the procedure was terminated.  The patient was taken to the recovery room in satisfactory condition.

## 2011-05-29 NOTE — Transfer of Care (Signed)
Immediate Anesthesia Transfer of Care Note  Patient: Shelly Mosley  Procedure(s) Performed:  DILATATION AND CURETTAGE (D&C) /HYSTEROSCOPY  Patient Location: PACU  Anesthesia Type: General  Level of Consciousness: awake, alert  and oriented  Airway & Oxygen Therapy: Patient Spontanous Breathing and Patient connected to nasal cannula oxygen  Post-op Assessment: Report given to PACU RN and Post -op Vital signs reviewed and stable  Post vital signs: Reviewed and stable  Complications: No apparent anesthesia complications

## 2011-05-29 NOTE — Anesthesia Postprocedure Evaluation (Signed)
Anesthesia Post Note  Patient: Shelly Mosley  Procedure(s) Performed:  DILATATION AND CURETTAGE (D&C) /HYSTEROSCOPY  Anesthesia type: General  Patient location: PACU  Post pain: Pain level controlled  Post assessment: Post-op Vital signs reviewed  Last Vitals:  Filed Vitals:   05/29/11 1045  BP: 103/58  Pulse: 58  Temp: 97.8 F (36.6 C)  Resp: 20    Post vital signs: Reviewed  Level of consciousness: sedated  Complications: No apparent anesthesia complications

## 2011-05-30 ENCOUNTER — Encounter (HOSPITAL_COMMUNITY): Payer: Self-pay | Admitting: Obstetrics and Gynecology

## 2011-06-02 LAB — DIFFERENTIAL
Basophils Absolute: 0
Basophils Relative: 0
Eosinophils Absolute: 0
Eosinophils Relative: 0
Lymphocytes Relative: 25
Monocytes Absolute: 0.3

## 2011-06-02 LAB — CBC
HCT: 45
Hemoglobin: 15.2 — ABNORMAL HIGH
MCHC: 33.8
MCV: 94.6
Platelets: 323
RDW: 13.1

## 2011-06-02 LAB — I-STAT 8, (EC8 V) (CONVERTED LAB)
BUN: 9
Bicarbonate: 26.1 — ABNORMAL HIGH
HCT: 48 — ABNORMAL HIGH
Hemoglobin: 16.3 — ABNORMAL HIGH
Operator id: 161631
Potassium: 3.6
Sodium: 141

## 2011-06-02 LAB — POCT CARDIAC MARKERS: Troponin i, poc: <0.05

## 2011-08-10 ENCOUNTER — Telehealth: Payer: Self-pay | Admitting: Oncology

## 2011-08-10 ENCOUNTER — Encounter: Payer: Self-pay | Admitting: Oncology

## 2011-08-10 ENCOUNTER — Ambulatory Visit (HOSPITAL_BASED_OUTPATIENT_CLINIC_OR_DEPARTMENT_OTHER): Payer: Medicare Other | Admitting: Oncology

## 2011-08-10 VITALS — BP 120/76 | HR 70 | Temp 98.1°F | Wt 217.3 lb

## 2011-08-10 DIAGNOSIS — Z1239 Encounter for other screening for malignant neoplasm of breast: Secondary | ICD-10-CM

## 2011-08-10 DIAGNOSIS — F17200 Nicotine dependence, unspecified, uncomplicated: Secondary | ICD-10-CM

## 2011-08-10 DIAGNOSIS — Z803 Family history of malignant neoplasm of breast: Secondary | ICD-10-CM

## 2011-08-10 DIAGNOSIS — Z1231 Encounter for screening mammogram for malignant neoplasm of breast: Secondary | ICD-10-CM

## 2011-08-10 HISTORY — DX: Encounter for other screening for malignant neoplasm of breast: Z12.39

## 2011-08-10 NOTE — Telephone Encounter (Signed)
Gv pt appt for [id number] °

## 2011-08-16 NOTE — Progress Notes (Signed)
OFFICE PROGRESS NOTE  CC Dr. Adela Lank Dr. Meredeth Ide   DIAGNOSIS: 65 year old female with strong family history of breast cancer patient was originally seen in the high-risk clinic regarding risk reduction for breast cancer. However she declined any type of chemoprevention.   CURRENT THERAPY:observation patient is being seen on a yearly basis.  INTERVAL HISTORY: Shelly Mosley 65 y.o. female returns for followup visit today in the high-risk clinic. Overall she has done well without any significant complaints. She is currently not receiving any type of and time estrogen therapy as a chemopreventive for development of breast cancer. However she is continuing to do self breast examinations. Unfortunately she is also continuing to smoke as well. She is trying to exercise as much as you possibly can remainder of the review of systems is negative.  MEDICAL HISTORY: Past Medical History  Diagnosis Date  . Cancer     colectomy for precancer cell  . Chronic kidney disease     hx cirrhosis of liver  . Depression   . Hypothyroidism   . COPD (chronic obstructive pulmonary disease)   . Neuropathy, peripheral   . Current smoker   . Palpitations   . Hyperlipidemia   . Shortness of breath     with exercise  . GERD (gastroesophageal reflux disease)     occ. tums  . Anxiety   . Arthritis     right knee  . Chronic back pain greater than 3 months duration   . Breast cancer screening, high risk patient 08/10/2011    ALLERGIES:  is allergic to codeine; ZOX:WRUEAVWUJWJ+XBJYNWGNF+AOZHYQMVHQ acid+aspartame; sulfonamide derivatives; and nsaids.  MEDICATIONS:  Current Outpatient Prescriptions  Medication Sig Dispense Refill  . CALCIUM CITRATE PO Take 1 tablet by mouth 2 (two) times daily.        . cholecalciferol (VITAMIN D) 1000 UNITS tablet Take 1,000 Units by mouth 2 (two) times daily.        Marland Kitchen estradiol (CLIMARA - DOSED IN MG/24 HR) 0.05 mg/24hr Place 1 patch onto the skin once a  week. Changes patch every Wednesday.       . hydroxypropyl cellulose (LACRISERT) 5 MG INST Place 5 mg into both eyes at bedtime.        Marland Kitchen LORazepam (ATIVAN) 0.5 MG tablet Take 0.5 mg by mouth as needed. For anxiety         . Multiple Vitamin (MULTIVITAMIN) tablet Take 1 tablet by mouth daily.        . niacin (NIASPAN) 1000 MG CR tablet Take 2,000 mg by mouth at bedtime.        Marland Kitchen omega-3 acid ethyl esters (LOVAZA) 1 G capsule Take 2 g by mouth 2 (two) times daily.        . pregabalin (LYRICA) 100 MG capsule Take 100 mg by mouth 3 (three) times daily.        . progesterone (PROMETRIUM) 100 MG capsule Take 100 mg by mouth at bedtime.        Marland Kitchen thyroid (ARMOUR) 60 MG tablet Take 120 mg by mouth daily.        . traMADol (ULTRAM) 50 MG tablet Take 50 mg by mouth every 8 (eight) hours.        Marland Kitchen venlafaxine (EFFEXOR) 25 MG tablet Take 62.5 mg by mouth daily.         SURGICAL HISTORY:  Past Surgical History  Procedure Date  . Dilation and curettage of uterus   . Cholecystectomy   . Back surgery  L4-L5, 03/2003  . Tonsillectomy   . Wisdom tooth extraction   . Right colectomy   . Colonscopy   . Hysteroscopy w/d&c 05/29/2011    Procedure: DILATATION AND CURETTAGE (D&C) /HYSTEROSCOPY;  Surgeon: Edwena Felty Romine;  Location: [facility];  Service: Gynecology;  Laterality: N/A;    REVIEW OF SYSTEMS:  Pertinent items are noted in HPI.   PHYSICAL EXAMINATION: General appearance: alert, cooperative, appears older than stated age, fatigued and pale Head: Normocephalic, without obvious abnormality, atraumatic Neck: no adenopathy, no carotid bruit, no JVD, supple, symmetrical, trachea midline and thyroid not enlarged, symmetric, no tenderness/mass/nodules Lymph nodes: Cervical, supraclavicular, and axillary nodes normal. Resp: clear to auscultation bilaterally and normal percussion bilaterally Back: symmetric, no curvature. ROM normal. No CVA tenderness. Cardio: regular rate and rhythm, S1, S2 normal, no  murmur, click, rub or gallop and normal apical impulse GI: soft, non-tender; bowel sounds normal; no masses,  no organomegaly Extremities: extremities normal, atraumatic, no cyanosis or edema Neurologic: Alert and oriented X 3, normal strength and tone. Normal symmetric reflexes. Normal coordination and gait Bilateral breasts are examined there no masses nipple discharge or skin changes. ECOG PERFORMANCE STATUS: 1 - Symptomatic but completely ambulatory  Blood pressure 120/76, pulse 70, temperature 98.1 F (36.7 C), temperature source Oral, weight 217 lb 4.8 oz (98.567 kg).  LABORATORY DATA: Lab Results  Component Value Date   WBC 8.7 05/26/2011   HGB 13.7 05/26/2011   HCT 42.0 05/26/2011   MCV 100.2* 05/26/2011   PLT 226 05/26/2011      Chemistry      Component Value Date/Time   NA 139 05/29/2011 0812   K 4.1 05/29/2011 0812   CL 103 05/29/2011 0812   CO2 30 05/29/2011 0812   BUN 11 05/29/2011 0812   CREATININE 0.61 05/29/2011 0812      Component Value Date/Time   CALCIUM 9.8 05/29/2011 0812   ALKPHOS 135* 08/08/2010 0850   AST 31 08/08/2010 0850   ALT 29 08/08/2010 0850   BILITOT 0.4 08/08/2010 0850       RADIOGRAPHIC STUDIES:  No results found.  ASSESSMENT: 65 year old female seen in the high-risk clinic regarding risk reduction for breast cancer with a strong family history. However in the past we have tried to use tamoxifen as well as Evista but patient does not want to be on anything as the risks of treatment for her are worse than the benefits that she may get from this. She has seems to have a lot of stress in her life. She continues to smoke. She only once observation.   PLAN: we will continue to see the patient on a yearly basis. She knows to call with any problems she will continue to get her mammograms.   All questions were answered. The patient knows to call the clinic with any problems, questions or concerns. We can certainly see the patient much sooner if  necessary.  I spent 25 minutes counseling the patient face to face. The total time spent in the appointment was 30 minutes.    Drue Second, MD Medical/Oncology [facility] [phone] (beeper) [phone] (Office)  08/16/2011, 9:52 AM

## 2011-08-25 DIAGNOSIS — K219 Gastro-esophageal reflux disease without esophagitis: Secondary | ICD-10-CM | POA: Diagnosis not present

## 2011-08-25 DIAGNOSIS — Z86010 Personal history of colonic polyps: Secondary | ICD-10-CM | POA: Diagnosis not present

## 2011-08-25 DIAGNOSIS — R1310 Dysphagia, unspecified: Secondary | ICD-10-CM | POA: Diagnosis not present

## 2011-09-06 DIAGNOSIS — R0602 Shortness of breath: Secondary | ICD-10-CM | POA: Diagnosis not present

## 2011-10-05 DIAGNOSIS — K5730 Diverticulosis of large intestine without perforation or abscess without bleeding: Secondary | ICD-10-CM | POA: Diagnosis not present

## 2011-10-05 DIAGNOSIS — K2970 Gastritis, unspecified, without bleeding: Secondary | ICD-10-CM | POA: Diagnosis not present

## 2011-10-05 DIAGNOSIS — R12 Heartburn: Secondary | ICD-10-CM | POA: Diagnosis not present

## 2011-10-05 DIAGNOSIS — R1310 Dysphagia, unspecified: Secondary | ICD-10-CM | POA: Diagnosis not present

## 2011-10-05 DIAGNOSIS — K209 Esophagitis, unspecified without bleeding: Secondary | ICD-10-CM | POA: Diagnosis not present

## 2011-10-05 DIAGNOSIS — K2940 Chronic atrophic gastritis without bleeding: Secondary | ICD-10-CM | POA: Diagnosis not present

## 2011-10-05 DIAGNOSIS — K208 Other esophagitis without bleeding: Secondary | ICD-10-CM | POA: Diagnosis not present

## 2011-10-05 DIAGNOSIS — Z86010 Personal history of colonic polyps: Secondary | ICD-10-CM | POA: Diagnosis not present

## 2011-10-05 DIAGNOSIS — K2990 Gastroduodenitis, unspecified, without bleeding: Secondary | ICD-10-CM | POA: Diagnosis not present

## 2011-10-21 DIAGNOSIS — N390 Urinary tract infection, site not specified: Secondary | ICD-10-CM | POA: Diagnosis not present

## 2011-11-01 ENCOUNTER — Emergency Department (INDEPENDENT_AMBULATORY_CARE_PROVIDER_SITE_OTHER)
Admission: EM | Admit: 2011-11-01 | Discharge: 2011-11-01 | Disposition: A | Discharge status: Home Health | Payer: Medicare Other | Source: Home / Self Care | Attending: Emergency Medicine | Admitting: Emergency Medicine

## 2011-11-01 ENCOUNTER — Encounter: Payer: Self-pay | Admitting: *Deleted

## 2011-11-01 DIAGNOSIS — R05 Cough: Secondary | ICD-10-CM | POA: Diagnosis not present

## 2011-11-01 DIAGNOSIS — J069 Acute upper respiratory infection, unspecified: Secondary | ICD-10-CM | POA: Diagnosis not present

## 2011-11-01 DIAGNOSIS — R059 Cough, unspecified: Secondary | ICD-10-CM

## 2011-11-01 MED ORDER — AMOXICILLIN 875 MG PO TABS: 875.0000 mg | ORAL_TABLET | Freq: Two times a day (BID) | ORAL | Status: AC

## 2011-11-01 MED ORDER — HYDROCOD POLST-CHLORPHEN POLST 10-8 MG/5ML PO LQCR: 5.0000 mL | Freq: Two times a day (BID) | ORAL | Status: DC

## 2011-11-01 NOTE — ED Provider Notes (Signed)
History     CSN: [id number]  Arrival date & time 11/01/11  4098   First MD Initiated Contact with Patient 11/01/11 1007      Chief Complaint  Patient presents with  . Cough  . Nasal Congestion    (Consider location/radiation/quality/duration/timing/severity/associated sxs/prior treatment) HPI Shelly Mosley is a 66 y.o. female who complains of onset of cold symptoms for 7 days. +smoker +sore throat + cough No pleuritic pain No wheezing + nasal congestion + post-nasal drainage + sinus pain/pressure No chest congestion No itchy/red eyes + earache No hemoptysis No SOB No chills/sweats + fever No nausea No vomiting No abdominal pain No diarrhea No skin rashes No fatigue No myalgias No headache    Past Medical History  Diagnosis Date  . Cancer     colectomy for precancer cell  . Chronic kidney disease     hx cirrhosis of liver  . Depression   . Hypothyroidism   . COPD (chronic obstructive pulmonary disease)   . Neuropathy, peripheral   . Current smoker   . Palpitations   . Hyperlipidemia   . Shortness of breath     with exercise  . GERD (gastroesophageal reflux disease)     occ. tums  . Anxiety   . Arthritis     right knee  . Chronic back pain greater than 3 months duration   . Breast cancer screening, high risk patient 08/10/2011    Past Surgical History  Procedure Date  . Dilation and curettage of uterus   . Cholecystectomy   . Back surgery     L4-L5, 03/2003  . Tonsillectomy   . Wisdom tooth extraction   . Right colectomy   . Colonscopy   . Hysteroscopy w/d&c 05/29/2011    Procedure: DILATATION AND CURETTAGE (D&C) /HYSTEROSCOPY;  Surgeon: Edwena Felty Romine;  Location: [facility];  Service: Gynecology;  Laterality: N/A;    Family History  Problem Relation Age of Onset  . Diabetes Mother   . Heart failure Father   . Hypertension Sister   . Diabetes Brother   . Hypertension Brother     History  Substance Use Topics  . Smoking status: Current  Everyday Smoker -- 0.5 packs/day for 42 years    Types: Cigarettes  . Smokeless tobacco: Never Used  . Alcohol Use: No     hx of ETOH when pt was in her 40's.    OB History    Grav Para Term Preterm Abortions TAB SAB Ect Mult Living                  Review of Systems  All other systems reviewed and are negative.    Allergies  Codeine; JXB:JYNWGNFAOZH+YQMVHQION+GEXBMWUXLK acid+aspartame; Sulfonamide derivatives; and Nsaids  Home Medications   Current Outpatient Rx  Name Route Sig Dispense Refill  . AMOXICILLIN 875 MG PO TABS Oral Take 1 tablet (875 mg total) by mouth 2 (two) times daily. 16 tablet 0  . CALCIUM CITRATE PO Oral Take 1 tablet by mouth 2 (two) times daily.      Marland Kitchen HYDROCOD POLST-CPM POLST ER 10-8 MG/5ML PO LQCR Oral Take 5 mLs by mouth every 12 (twelve) hours. 80 mL 0  . VITAMIN D 1000 UNITS PO TABS Oral Take 1,000 Units by mouth 2 (two) times daily.      Marland Kitchen ESTRADIOL 0.05 MG/24HR TD PTWK Transdermal Place 1 patch onto the skin once a week. Changes patch every Wednesday.     Marland Kitchen LACRISERT 5 MG OP INST  Both Eyes Place 5 mg into both eyes at bedtime.      Marland Kitchen LORAZEPAM 0.5 MG PO TABS Oral Take 0.5 mg by mouth as needed. For anxiety       . ONE-DAILY MULTI VITAMINS PO TABS Oral Take 1 tablet by mouth daily.      Marland Kitchen NIACIN ER (ANTIHYPERLIPIDEMIC) 1000 MG PO TBCR Oral Take 2,000 mg by mouth at bedtime.      . OMEGA-3-ACID ETHYL ESTERS 1 G PO CAPS Oral Take 2 g by mouth 2 (two) times daily.      Marland Kitchen PREGABALIN 100 MG PO CAPS Oral Take 100 mg by mouth 3 (three) times daily.      Marland Kitchen PROGESTERONE MICRONIZED 100 MG PO CAPS Oral Take 100 mg by mouth at bedtime.      . THYROID 60 MG PO TABS Oral Take 120 mg by mouth daily.      . TRAMADOL HCL 50 MG PO TABS Oral Take 50 mg by mouth every 8 (eight) hours.      . VENLAFAXINE HCL 25 MG PO TABS Oral Take 62.5 mg by mouth daily.       BP 111/72  Pulse 82  Temp(Src) 98.3 F (36.8 C) (Oral)  Resp 20  Ht 5\' 7"  (1.702 m)  Wt 225 lb  (102.059 kg)  BMI 35.24 kg/m2  SpO2 95%  Physical Exam  Nursing note and vitals reviewed. Constitutional: She is oriented to person, place, and time. She appears well-developed and well-nourished.  HENT:  Head: Normocephalic and atraumatic.  Right Ear: Tympanic membrane, external ear and ear canal normal.  Left Ear: Tympanic membrane, external ear and ear canal normal.  Nose: Mucosal edema and rhinorrhea present.  Mouth/Throat: Posterior oropharyngeal erythema present. No oropharyngeal exudate or posterior oropharyngeal edema.  Eyes: No scleral icterus.  Neck: Neck supple.  Cardiovascular: Regular rhythm and normal heart sounds.   Pulmonary/Chest: Effort normal and breath sounds normal. No respiratory distress.  Neurological: She is alert and oriented to person, place, and time.  Skin: Skin is warm and dry.  Psychiatric: She has a normal mood and affect. Her speech is normal.    ED Course  Procedures (including critical care time)  Labs Reviewed - No data to display No results found.   1. Cough   2. Acute upper respiratory infections of unspecified site       MDM  1)  Take the prescribed antibiotic as instructed. 2)  Use nasal saline solution (over the counter) at least 3 times a day. 3)  Use over the counter decongestants like Zyrtec-D every 12 hours as needed to help with congestion.  If you have hypertension, do not take medicines with sudafed.  4)  Can take tylenol every 6 hours or motrin every 8 hours for pain or fever. 5)  Follow up with your primary doctor if no improvement in 5-7 days, sooner if increasing pain, fever, or new symptoms.        Marlaine Hind, MD 11/01/11 [phone]

## 2011-11-01 NOTE — ED Notes (Signed)
Pt c/o nasal congestion, productive cough, sore throat, fever and ear ache x 1 wk.  She has taken ASA and increased fluids.

## 2011-11-15 DIAGNOSIS — R059 Cough, unspecified: Secondary | ICD-10-CM | POA: Diagnosis not present

## 2011-11-15 DIAGNOSIS — R0609 Other forms of dyspnea: Secondary | ICD-10-CM | POA: Diagnosis not present

## 2011-11-15 DIAGNOSIS — R05 Cough: Secondary | ICD-10-CM | POA: Diagnosis not present

## 2011-11-15 DIAGNOSIS — R319 Hematuria, unspecified: Secondary | ICD-10-CM | POA: Diagnosis not present

## 2011-11-15 DIAGNOSIS — N390 Urinary tract infection, site not specified: Secondary | ICD-10-CM | POA: Diagnosis not present

## 2011-11-15 DIAGNOSIS — E0789 Other specified disorders of thyroid: Secondary | ICD-10-CM | POA: Diagnosis not present

## 2011-11-30 DIAGNOSIS — F339 Major depressive disorder, recurrent, unspecified: Secondary | ICD-10-CM | POA: Diagnosis not present

## 2011-11-30 DIAGNOSIS — F341 Dysthymic disorder: Secondary | ICD-10-CM | POA: Diagnosis not present

## 2011-11-30 DIAGNOSIS — F451 Undifferentiated somatoform disorder: Secondary | ICD-10-CM | POA: Diagnosis not present

## 2011-12-09 HISTORY — PX: OTHER SURGICAL HISTORY: SHX169

## 2012-01-03 DIAGNOSIS — N950 Postmenopausal bleeding: Secondary | ICD-10-CM | POA: Diagnosis not present

## 2012-01-03 DIAGNOSIS — Q504 Embryonic cyst of fallopian tube: Secondary | ICD-10-CM | POA: Diagnosis not present

## 2012-01-03 DIAGNOSIS — Q505 Embryonic cyst of broad ligament: Secondary | ICD-10-CM | POA: Diagnosis not present

## 2012-01-03 DIAGNOSIS — N859 Noninflammatory disorder of uterus, unspecified: Secondary | ICD-10-CM | POA: Diagnosis not present

## 2012-01-16 DIAGNOSIS — N951 Menopausal and female climacteric states: Secondary | ICD-10-CM | POA: Diagnosis not present

## 2012-01-16 DIAGNOSIS — Z124 Encounter for screening for malignant neoplasm of cervix: Secondary | ICD-10-CM | POA: Diagnosis not present

## 2012-01-16 DIAGNOSIS — E559 Vitamin D deficiency, unspecified: Secondary | ICD-10-CM | POA: Diagnosis not present

## 2012-01-16 DIAGNOSIS — Z0000 Encounter for general adult medical examination without abnormal findings: Secondary | ICD-10-CM | POA: Diagnosis not present

## 2012-01-16 DIAGNOSIS — Z01419 Encounter for gynecological examination (general) (routine) without abnormal findings: Secondary | ICD-10-CM | POA: Diagnosis not present

## 2012-02-08 DIAGNOSIS — E789 Disorder of lipoprotein metabolism, unspecified: Secondary | ICD-10-CM | POA: Diagnosis not present

## 2012-02-15 DIAGNOSIS — E789 Disorder of lipoprotein metabolism, unspecified: Secondary | ICD-10-CM | POA: Diagnosis not present

## 2012-02-15 DIAGNOSIS — R0609 Other forms of dyspnea: Secondary | ICD-10-CM | POA: Diagnosis not present

## 2012-02-15 DIAGNOSIS — E0789 Other specified disorders of thyroid: Secondary | ICD-10-CM | POA: Diagnosis not present

## 2012-02-15 DIAGNOSIS — G589 Mononeuropathy, unspecified: Secondary | ICD-10-CM | POA: Diagnosis not present

## 2012-02-23 DIAGNOSIS — Z1231 Encounter for screening mammogram for malignant neoplasm of breast: Secondary | ICD-10-CM | POA: Diagnosis not present

## 2012-03-15 DIAGNOSIS — M545 Low back pain, unspecified: Secondary | ICD-10-CM | POA: Diagnosis not present

## 2012-03-15 DIAGNOSIS — M546 Pain in thoracic spine: Secondary | ICD-10-CM | POA: Diagnosis not present

## 2012-03-18 DIAGNOSIS — I10 Essential (primary) hypertension: Secondary | ICD-10-CM | POA: Diagnosis not present

## 2012-03-19 ENCOUNTER — Other Ambulatory Visit: Payer: Self-pay | Admitting: Specialist

## 2012-03-19 DIAGNOSIS — M549 Dorsalgia, unspecified: Secondary | ICD-10-CM

## 2012-04-01 ENCOUNTER — Ambulatory Visit
Admission: RE | Admit: 2012-04-01 | Discharge: 2012-04-01 | Disposition: A | Discharge status: Home / Self Care | Payer: Medicare Other | Source: Ambulatory Visit | Attending: Specialist | Admitting: Specialist

## 2012-04-01 DIAGNOSIS — M549 Dorsalgia, unspecified: Secondary | ICD-10-CM

## 2012-04-01 DIAGNOSIS — M5124 Other intervertebral disc displacement, thoracic region: Secondary | ICD-10-CM | POA: Diagnosis not present

## 2012-04-01 MED ORDER — GADOBENATE DIMEGLUMINE 529 MG/ML IV SOLN
19.0000 mL | Freq: Once | INTRAVENOUS | Status: AC | PRN
  Administered 2012-04-01: 19 mL via INTRAVENOUS

## 2012-04-10 DIAGNOSIS — E0789 Other specified disorders of thyroid: Secondary | ICD-10-CM | POA: Diagnosis not present

## 2012-04-17 DIAGNOSIS — I10 Essential (primary) hypertension: Secondary | ICD-10-CM | POA: Diagnosis not present

## 2012-04-17 DIAGNOSIS — E0789 Other specified disorders of thyroid: Secondary | ICD-10-CM | POA: Diagnosis not present

## 2012-04-17 DIAGNOSIS — R109 Unspecified abdominal pain: Secondary | ICD-10-CM | POA: Diagnosis not present

## 2012-04-17 DIAGNOSIS — IMO0002 Reserved for concepts with insufficient information to code with codable children: Secondary | ICD-10-CM | POA: Diagnosis not present

## 2012-04-18 DIAGNOSIS — R079 Chest pain, unspecified: Secondary | ICD-10-CM | POA: Diagnosis not present

## 2012-04-18 DIAGNOSIS — M545 Low back pain, unspecified: Secondary | ICD-10-CM | POA: Diagnosis not present

## 2012-04-18 DIAGNOSIS — I10 Essential (primary) hypertension: Secondary | ICD-10-CM | POA: Diagnosis not present

## 2012-04-18 DIAGNOSIS — I359 Nonrheumatic aortic valve disorder, unspecified: Secondary | ICD-10-CM | POA: Diagnosis not present

## 2012-04-24 ENCOUNTER — Other Ambulatory Visit: Payer: Self-pay | Admitting: Endocrinology

## 2012-04-24 DIAGNOSIS — M549 Dorsalgia, unspecified: Secondary | ICD-10-CM

## 2012-04-24 DIAGNOSIS — R109 Unspecified abdominal pain: Secondary | ICD-10-CM

## 2012-04-25 DIAGNOSIS — E559 Vitamin D deficiency, unspecified: Secondary | ICD-10-CM | POA: Diagnosis not present

## 2012-05-01 ENCOUNTER — Ambulatory Visit
Admission: RE | Admit: 2012-05-01 | Discharge: 2012-05-01 | Disposition: A | Discharge status: Home / Self Care | Payer: Medicare Other | Source: Ambulatory Visit | Attending: Endocrinology | Admitting: Endocrinology

## 2012-05-01 DIAGNOSIS — M549 Dorsalgia, unspecified: Secondary | ICD-10-CM

## 2012-05-01 DIAGNOSIS — R109 Unspecified abdominal pain: Secondary | ICD-10-CM

## 2012-05-01 DIAGNOSIS — Z23 Encounter for immunization: Secondary | ICD-10-CM | POA: Diagnosis not present

## 2012-05-23 DIAGNOSIS — F339 Major depressive disorder, recurrent, unspecified: Secondary | ICD-10-CM | POA: Diagnosis not present

## 2012-05-23 DIAGNOSIS — F341 Dysthymic disorder: Secondary | ICD-10-CM | POA: Diagnosis not present

## 2012-05-30 DIAGNOSIS — M25579 Pain in unspecified ankle and joints of unspecified foot: Secondary | ICD-10-CM | POA: Diagnosis not present

## 2012-05-30 DIAGNOSIS — M2550 Pain in unspecified joint: Secondary | ICD-10-CM | POA: Diagnosis not present

## 2012-06-10 ENCOUNTER — Ambulatory Visit: Payer: Medicare Other | Attending: Specialist | Admitting: Physical Therapy

## 2012-06-10 DIAGNOSIS — M2560 Stiffness of unspecified joint, not elsewhere classified: Secondary | ICD-10-CM | POA: Insufficient documentation

## 2012-06-10 DIAGNOSIS — M2550 Pain in unspecified joint: Secondary | ICD-10-CM | POA: Diagnosis not present

## 2012-06-10 DIAGNOSIS — M6281 Muscle weakness (generalized): Secondary | ICD-10-CM | POA: Insufficient documentation

## 2012-06-10 DIAGNOSIS — IMO0001 Reserved for inherently not codable concepts without codable children: Secondary | ICD-10-CM | POA: Diagnosis not present

## 2012-06-13 ENCOUNTER — Ambulatory Visit: Payer: Medicare Other | Admitting: Physical Therapy

## 2012-06-18 ENCOUNTER — Ambulatory Visit: Payer: Medicare Other | Admitting: Physical Therapy

## 2012-06-20 ENCOUNTER — Ambulatory Visit: Payer: Medicare Other | Admitting: Physical Therapy

## 2012-06-25 ENCOUNTER — Ambulatory Visit: Payer: Medicare Other | Attending: Specialist | Admitting: Physical Therapy

## 2012-06-25 DIAGNOSIS — IMO0001 Reserved for inherently not codable concepts without codable children: Secondary | ICD-10-CM | POA: Diagnosis not present

## 2012-06-25 DIAGNOSIS — M2550 Pain in unspecified joint: Secondary | ICD-10-CM | POA: Insufficient documentation

## 2012-06-25 DIAGNOSIS — M2560 Stiffness of unspecified joint, not elsewhere classified: Secondary | ICD-10-CM | POA: Diagnosis not present

## 2012-06-25 DIAGNOSIS — M6281 Muscle weakness (generalized): Secondary | ICD-10-CM | POA: Insufficient documentation

## 2012-06-28 ENCOUNTER — Ambulatory Visit: Payer: Medicare Other | Admitting: Physical Therapy

## 2012-07-02 ENCOUNTER — Encounter: Payer: Medicare Other | Admitting: Physical Therapy

## 2012-07-05 ENCOUNTER — Ambulatory Visit: Payer: Medicare Other | Admitting: Physical Therapy

## 2012-07-08 ENCOUNTER — Ambulatory Visit: Payer: Medicare Other | Admitting: Physical Therapy

## 2012-07-10 ENCOUNTER — Ambulatory Visit: Payer: Medicare Other | Admitting: Physical Therapy

## 2012-07-11 DIAGNOSIS — M546 Pain in thoracic spine: Secondary | ICD-10-CM | POA: Diagnosis not present

## 2012-07-11 DIAGNOSIS — R079 Chest pain, unspecified: Secondary | ICD-10-CM | POA: Diagnosis not present

## 2012-07-16 ENCOUNTER — Ambulatory Visit: Payer: Medicare Other | Admitting: Physical Therapy

## 2012-07-23 ENCOUNTER — Ambulatory Visit: Payer: Medicare Other | Attending: Specialist | Admitting: Physical Therapy

## 2012-07-23 DIAGNOSIS — M2560 Stiffness of unspecified joint, not elsewhere classified: Secondary | ICD-10-CM | POA: Insufficient documentation

## 2012-07-23 DIAGNOSIS — M2550 Pain in unspecified joint: Secondary | ICD-10-CM | POA: Diagnosis not present

## 2012-07-23 DIAGNOSIS — IMO0001 Reserved for inherently not codable concepts without codable children: Secondary | ICD-10-CM | POA: Diagnosis not present

## 2012-07-23 DIAGNOSIS — M6281 Muscle weakness (generalized): Secondary | ICD-10-CM | POA: Diagnosis not present

## 2012-07-29 DIAGNOSIS — H04129 Dry eye syndrome of unspecified lacrimal gland: Secondary | ICD-10-CM | POA: Diagnosis not present

## 2012-08-05 DIAGNOSIS — E0789 Other specified disorders of thyroid: Secondary | ICD-10-CM | POA: Diagnosis not present

## 2012-08-05 DIAGNOSIS — E789 Disorder of lipoprotein metabolism, unspecified: Secondary | ICD-10-CM | POA: Diagnosis not present

## 2012-08-05 DIAGNOSIS — Z79899 Other long term (current) drug therapy: Secondary | ICD-10-CM | POA: Diagnosis not present

## 2012-08-07 ENCOUNTER — Ambulatory Visit (HOSPITAL_BASED_OUTPATIENT_CLINIC_OR_DEPARTMENT_OTHER): Payer: Medicare Other | Admitting: Oncology

## 2012-08-07 ENCOUNTER — Telehealth: Payer: Self-pay | Admitting: *Deleted

## 2012-08-07 ENCOUNTER — Encounter: Payer: Self-pay | Admitting: Oncology

## 2012-08-07 VITALS — BP 128/72 | HR 80 | Temp 97.8°F | Resp 20 | Ht 67.0 in | Wt 240.1 lb

## 2012-08-07 DIAGNOSIS — Z1239 Encounter for other screening for malignant neoplasm of breast: Secondary | ICD-10-CM | POA: Diagnosis not present

## 2012-08-07 DIAGNOSIS — Z803 Family history of malignant neoplasm of breast: Secondary | ICD-10-CM

## 2012-08-07 DIAGNOSIS — F17200 Nicotine dependence, unspecified, uncomplicated: Secondary | ICD-10-CM

## 2012-08-07 NOTE — Progress Notes (Signed)
OFFICE PROGRESS NOTE  CC Dr. Adela Lank Dr. Meredeth Ide   DIAGNOSIS: 66 year old female with strong family history of breast cancer patient was originally seen in the high-risk clinic regarding risk reduction for breast cancer. However she declined any type of chemoprevention.   CURRENT THERAPY:observation patient is being seen on a yearly basis.  INTERVAL HISTORY: Shelly Mosley 66 y.o. female returns for followup visit today in the high-risk clinic. Overall she has done well without any significant complaints. She is currently not receiving any type of and time estrogen therapy as a chemopreventive for development of breast cancer. However she is continuing to do self breast examinations. Unfortunately she is also continuing to smoke as well. She is trying to exercise as much as you possibly can remainder of the review of systems is negative.  MEDICAL HISTORY: Past Medical History  Diagnosis Date  . Cancer     colectomy for precancer cell  . Chronic kidney disease     hx cirrhosis of liver  . Depression   . Hypothyroidism   . COPD (chronic obstructive pulmonary disease)   . Neuropathy, peripheral   . Current smoker   . Palpitations   . Hyperlipidemia   . Shortness of breath     with exercise  . GERD (gastroesophageal reflux disease)     occ. tums  . Anxiety   . Arthritis     right knee  . Chronic back pain greater than 3 months duration   . Breast cancer screening, high risk patient 08/10/2011    ALLERGIES:  is allergic to amoxicillin-pot clavulanate; codeine; sulfonamide derivatives; and nsaids.  MEDICATIONS:  Current Outpatient Prescriptions  Medication Sig Dispense Refill  . CALCIUM CITRATE PO Take 1 tablet by mouth 2 (two) times daily.        . chlorpheniramine-HYDROcodone (TUSSIONEX PENNKINETIC ER) 10-8 MG/5ML LQCR Take 5 mLs by mouth every 12 (twelve) hours.  80 mL  0  . cholecalciferol (VITAMIN D) 1000 UNITS tablet Take 1,000 Units by mouth 2 (two) times  daily.        Marland Kitchen estradiol (CLIMARA - DOSED IN MG/24 HR) 0.05 mg/24hr Place 1 patch onto the skin once a week. Changes patch every Wednesday.       . hydroxypropyl cellulose (LACRISERT) 5 MG INST Place 5 mg into both eyes at bedtime.        Marland Kitchen LORazepam (ATIVAN) 0.5 MG tablet Take 0.5 mg by mouth as needed. For anxiety         . Multiple Vitamin (MULTIVITAMIN) tablet Take 1 tablet by mouth daily.        . niacin (NIASPAN) 1000 MG CR tablet Take 2,000 mg by mouth at bedtime.        Marland Kitchen omega-3 acid ethyl esters (LOVAZA) 1 G capsule Take 2 g by mouth 2 (two) times daily.        . pregabalin (LYRICA) 100 MG capsule Take 100 mg by mouth 3 (three) times daily.        . progesterone (PROMETRIUM) 100 MG capsule Take 100 mg by mouth at bedtime.        Marland Kitchen thyroid (ARMOUR) 60 MG tablet Take 120 mg by mouth daily.        . traMADol (ULTRAM) 50 MG tablet Take 50 mg by mouth every 8 (eight) hours.        Marland Kitchen venlafaxine (EFFEXOR) 25 MG tablet Take 62.5 mg by mouth daily.         SURGICAL HISTORY:  Past Surgical History  Procedure Date  . Dilation and curettage of uterus   . Cholecystectomy   . Back surgery     L4-L5, 03/2003  . Tonsillectomy   . Wisdom tooth extraction   . Right colectomy   . Colonscopy   . Hysteroscopy w/d&c 05/29/2011    Procedure: DILATATION AND CURETTAGE (D&C) /HYSTEROSCOPY;  Surgeon: Edwena Felty Romine;  Location: [facility];  Service: Gynecology;  Laterality: N/A;    REVIEW OF SYSTEMS:  Pertinent items are noted in HPI.   PHYSICAL EXAMINATION: General appearance: alert, cooperative, appears older than stated age, fatigued and pale Head: Normocephalic, without obvious abnormality, atraumatic Neck: no adenopathy, no carotid bruit, no JVD, supple, symmetrical, trachea midline and thyroid not enlarged, symmetric, no tenderness/mass/nodules Lymph nodes: Cervical, supraclavicular, and axillary nodes normal. Resp: clear to auscultation bilaterally and normal percussion bilaterally Back:  symmetric, no curvature. ROM normal. No CVA tenderness. Cardio: regular rate and rhythm, S1, S2 normal, no murmur, click, rub or gallop and normal apical impulse GI: soft, non-tender; bowel sounds normal; no masses,  no organomegaly Extremities: extremities normal, atraumatic, no cyanosis or edema Neurologic: Alert and oriented X 3, normal strength and tone. Normal symmetric reflexes. Normal coordination and gait Bilateral breasts are examined there no masses nipple discharge or skin changes. ECOG PERFORMANCE STATUS: 1 - Symptomatic but completely ambulatory  Blood pressure 128/72, pulse 80, temperature 97.8 F (36.6 C), resp. rate 20, height 5\' 7"  (1.702 m), weight 240 lb 1.6 oz (108.909 kg).  LABORATORY DATA: Lab Results  Component Value Date   WBC 8.7 05/26/2011   HGB 13.7 05/26/2011   HCT 42.0 05/26/2011   MCV 100.2* 05/26/2011   PLT 226 05/26/2011      Chemistry      Component Value Date/Time   NA 139 05/29/2011 0812   K 4.1 05/29/2011 0812   CL 103 05/29/2011 0812   CO2 30 05/29/2011 0812   BUN 11 05/29/2011 0812   CREATININE 0.61 05/29/2011 0812      Component Value Date/Time   CALCIUM 9.8 05/29/2011 0812   ALKPHOS 135* 08/08/2010 0850   AST 31 08/08/2010 0850   ALT 29 08/08/2010 0850   BILITOT 0.4 08/08/2010 0850       RADIOGRAPHIC STUDIES:  No results found.  ASSESSMENT: 66 year old female seen in the high-risk clinic regarding risk reduction for breast cancer with a strong family history. However in the past we have tried to use tamoxifen as well as Evista but patient does not want to be on anything as the risks of treatment for her are worse than the benefits that she may get from this. She has seems to have a lot of stress in her life. She continues to smoke.   PLAN: At this time recommendation is for patient to followup with her gynecologist in her PCP. I also certainly be available to her if any of arises in the future.  All questions were answered. The patient  knows to call the clinic with any problems, questions or concerns. We can certainly see the patient much sooner if necessary.  I spent 15 minutes counseling the patient face to face. The total time spent in the appointment was 30 minutes.    Drue Second, MD Medical/Oncology [facility] [phone] (beeper) [phone] (Office)  08/07/2012, 1:11 PM

## 2012-08-07 NOTE — Patient Instructions (Addendum)
Follow up with Dr. Tresa Res  We will see you back as needed

## 2012-08-07 NOTE — Telephone Encounter (Signed)
as needed

## 2012-08-20 DIAGNOSIS — E0789 Other specified disorders of thyroid: Secondary | ICD-10-CM | POA: Diagnosis not present

## 2012-08-20 DIAGNOSIS — L909 Atrophic disorder of skin, unspecified: Secondary | ICD-10-CM | POA: Diagnosis not present

## 2012-08-20 DIAGNOSIS — I10 Essential (primary) hypertension: Secondary | ICD-10-CM | POA: Diagnosis not present

## 2012-08-20 DIAGNOSIS — R0989 Other specified symptoms and signs involving the circulatory and respiratory systems: Secondary | ICD-10-CM | POA: Diagnosis not present

## 2012-08-20 DIAGNOSIS — L919 Hypertrophic disorder of the skin, unspecified: Secondary | ICD-10-CM | POA: Diagnosis not present

## 2012-08-20 DIAGNOSIS — R0609 Other forms of dyspnea: Secondary | ICD-10-CM | POA: Diagnosis not present

## 2012-09-03 DIAGNOSIS — N950 Postmenopausal bleeding: Secondary | ICD-10-CM | POA: Diagnosis not present

## 2012-09-10 DIAGNOSIS — Z0000 Encounter for general adult medical examination without abnormal findings: Secondary | ICD-10-CM | POA: Diagnosis not present

## 2012-09-10 DIAGNOSIS — E2839 Other primary ovarian failure: Secondary | ICD-10-CM | POA: Diagnosis not present

## 2012-09-10 DIAGNOSIS — F17200 Nicotine dependence, unspecified, uncomplicated: Secondary | ICD-10-CM | POA: Diagnosis not present

## 2012-09-11 DIAGNOSIS — N950 Postmenopausal bleeding: Secondary | ICD-10-CM | POA: Diagnosis not present

## 2012-11-14 DIAGNOSIS — F341 Dysthymic disorder: Secondary | ICD-10-CM | POA: Diagnosis not present

## 2012-11-14 DIAGNOSIS — F339 Major depressive disorder, recurrent, unspecified: Secondary | ICD-10-CM | POA: Diagnosis not present

## 2012-11-14 DIAGNOSIS — F451 Undifferentiated somatoform disorder: Secondary | ICD-10-CM | POA: Diagnosis not present

## 2012-12-11 DIAGNOSIS — E789 Disorder of lipoprotein metabolism, unspecified: Secondary | ICD-10-CM | POA: Diagnosis not present

## 2012-12-18 ENCOUNTER — Telehealth: Payer: Self-pay | Admitting: *Deleted

## 2012-12-18 DIAGNOSIS — E0789 Other specified disorders of thyroid: Secondary | ICD-10-CM | POA: Diagnosis not present

## 2012-12-18 DIAGNOSIS — I10 Essential (primary) hypertension: Secondary | ICD-10-CM | POA: Diagnosis not present

## 2012-12-18 DIAGNOSIS — IMO0002 Reserved for concepts with insufficient information to code with codable children: Secondary | ICD-10-CM | POA: Diagnosis not present

## 2012-12-18 DIAGNOSIS — E789 Disorder of lipoprotein metabolism, unspecified: Secondary | ICD-10-CM | POA: Diagnosis not present

## 2012-12-18 MED ORDER — ESTRADIOL 0.05 MG/24HR TD PTTW: 1.0000 | MEDICATED_PATCH | TRANSDERMAL | Status: DC

## 2012-12-18 NOTE — Telephone Encounter (Signed)
[facility] notified of Prior Authorization approved for [place] patch.

## 2013-01-16 ENCOUNTER — Encounter: Payer: Self-pay | Admitting: *Deleted

## 2013-01-16 DIAGNOSIS — E789 Disorder of lipoprotein metabolism, unspecified: Secondary | ICD-10-CM | POA: Diagnosis not present

## 2013-01-20 ENCOUNTER — Ambulatory Visit (INDEPENDENT_AMBULATORY_CARE_PROVIDER_SITE_OTHER): Payer: Medicare Other | Admitting: Nurse Practitioner

## 2013-01-20 ENCOUNTER — Encounter: Payer: Self-pay | Admitting: Nurse Practitioner

## 2013-01-20 VITALS — BP 128/66 | HR 74 | Ht 66.5 in | Wt 240.4 lb

## 2013-01-20 DIAGNOSIS — N950 Postmenopausal bleeding: Secondary | ICD-10-CM

## 2013-01-20 DIAGNOSIS — Z01419 Encounter for gynecological examination (general) (routine) without abnormal findings: Secondary | ICD-10-CM | POA: Diagnosis not present

## 2013-01-20 DIAGNOSIS — Z124 Encounter for screening for malignant neoplasm of cervix: Secondary | ICD-10-CM

## 2013-01-20 MED ORDER — ESTRADIOL 0.05 MG/24HR TD PTTW: 1.0000 | MEDICATED_PATCH | TRANSDERMAL | Status: DC

## 2013-01-20 MED ORDER — PROGESTERONE MICRONIZED 100 MG PO CAPS: 100.0000 mg | ORAL_CAPSULE | Freq: Every day | ORAL | Status: DC

## 2013-01-20 MED ORDER — TESTOSTERONE 10 MG/ACT (2%) TD GEL: 0.2500 | TRANSDERMAL | Status: DC

## 2013-01-20 MED ORDER — TESTOSTERONE 10 MG/ACT (2%) TD GEL: 0.5000 | TRANSDERMAL | Status: DC
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
## 2013-01-20 NOTE — Progress Notes (Signed)
Shelly Mosley,  She needs to have a D & C and hysteroscopy done.  She and I talked about that the last time I saw her, that if she continued to bleed, I wanted her to have a D & C done.  So, Kennon Rounds please call pt and schedule it, and also schedule her to see me a week or two before her surgery.

## 2013-01-20 NOTE — Progress Notes (Signed)
Kennon Rounds did you get note about Shelly Mosley? Dr. Tresa Res wants her to have surgery. Thanks PG

## 2013-01-20 NOTE — Patient Instructions (Signed)

## 2013-01-24 ENCOUNTER — Telehealth: Payer: Self-pay | Admitting: *Deleted

## 2013-01-24 NOTE — Telephone Encounter (Signed)
Call to patient to discuss surgery date preferences.  Patient is aware Dr Romine's first available is end of June or early July and patient is agreeable to this. Prefers not to have on 02-24-13 but anything else is fine.  Will precert, schedule and call her back.

## 2013-01-25 DIAGNOSIS — F451 Undifferentiated somatoform disorder: Secondary | ICD-10-CM | POA: Insufficient documentation

## 2013-01-25 DIAGNOSIS — I4510 Unspecified right bundle-branch block: Secondary | ICD-10-CM | POA: Insufficient documentation

## 2013-01-27 NOTE — Progress Notes (Signed)
Just want to double check about this patient.  Is this D&C planned?

## 2013-01-27 NOTE — Progress Notes (Signed)
Reviewed personally.  M. Suzanne Vi Biddinger, MD.  

## 2013-02-03 NOTE — Telephone Encounter (Signed)
I don't think she needs a pre-op PUS.  If she is going to see Dr. Precious Bard a week or two before surgery, then if Dr. Tresa Res wants that, there will still be time.  Thanks.

## 2013-02-03 NOTE — Telephone Encounter (Signed)
Case request for 02-17-13 sent to [facility] but patient needs surgery consult with Dr Tresa Res first so will need to resched this.  Patient updated and agreeable. Does patient need PUS at preop?

## 2013-02-03 NOTE — Telephone Encounter (Signed)
Patient calling to check on status of surgery date. °

## 2013-02-04 NOTE — Telephone Encounter (Signed)
Surgery scheduled for 03-03-13, instructions reviewed and mailed with pre/post op appts scheduled.

## 2013-02-10 NOTE — Progress Notes (Signed)
See next phone note for surgery info.  

## 2013-02-17 ENCOUNTER — Encounter: Payer: Self-pay | Admitting: Obstetrics and Gynecology

## 2013-02-17 ENCOUNTER — Ambulatory Visit (INDEPENDENT_AMBULATORY_CARE_PROVIDER_SITE_OTHER): Payer: Medicare Other | Admitting: Obstetrics and Gynecology

## 2013-02-17 ENCOUNTER — Encounter (HOSPITAL_COMMUNITY): Payer: Self-pay

## 2013-02-17 VITALS — BP 126/70 | HR 76 | Resp 14 | Ht 67.0 in | Wt 241.2 lb

## 2013-02-17 DIAGNOSIS — N950 Postmenopausal bleeding: Secondary | ICD-10-CM

## 2013-02-17 NOTE — Progress Notes (Signed)
67 yo DWF G0P0 with recurrent PMB, started again in May 2014 and she has had 2-4 days of spotting each month, one day was heavy.  Here for pre op D & C.  Pt is on HRT but feels she cannot stop it because it helps her dry eye so much and she fears she will damage her corneas without the benefits on her eyes that she experiences with the HRT.    Pt also c/o urinary leakage almost daily, and is wondering if she is a surgical candidate. We discussed conservative measures to deal with leakage incl voiding schedule, Kegel's, wt loss.  Pt wants to use conservative measures for now and consider surgery later if not improved.  Discussed D & C to r/o endometrial pathology.  She has had one before, and states she understands the procedure.  She watched the informed consent video and her questions were invited and answered.  She states she understands the risks and possible complications, accepts them, and wishes to proceed.

## 2013-02-17 NOTE — Patient Instructions (Signed)
We will see you next at the hospital for your D & C.  Please call if you have any questions before then.

## 2013-02-19 ENCOUNTER — Ambulatory Visit: Payer: Self-pay | Admitting: Obstetrics and Gynecology

## 2013-02-24 DIAGNOSIS — Z1231 Encounter for screening mammogram for malignant neoplasm of breast: Secondary | ICD-10-CM | POA: Diagnosis not present

## 2013-02-25 ENCOUNTER — Other Ambulatory Visit: Payer: Self-pay

## 2013-02-25 ENCOUNTER — Encounter (HOSPITAL_COMMUNITY)
Admission: RE | Admit: 2013-02-25 | Discharge: 2013-02-25 | Disposition: A | Discharge status: Home / Self Care | Payer: Medicare Other | Source: Ambulatory Visit | Attending: Obstetrics and Gynecology | Admitting: Obstetrics and Gynecology

## 2013-02-25 ENCOUNTER — Encounter (HOSPITAL_COMMUNITY): Payer: Self-pay

## 2013-02-25 DIAGNOSIS — K589 Irritable bowel syndrome without diarrhea: Secondary | ICD-10-CM | POA: Diagnosis not present

## 2013-02-25 DIAGNOSIS — E7800 Pure hypercholesterolemia, unspecified: Secondary | ICD-10-CM | POA: Diagnosis not present

## 2013-02-25 DIAGNOSIS — Z79890 Hormone replacement therapy (postmenopausal): Secondary | ICD-10-CM | POA: Diagnosis not present

## 2013-02-25 DIAGNOSIS — K219 Gastro-esophageal reflux disease without esophagitis: Secondary | ICD-10-CM | POA: Diagnosis not present

## 2013-02-25 DIAGNOSIS — N950 Postmenopausal bleeding: Secondary | ICD-10-CM | POA: Diagnosis not present

## 2013-02-25 DIAGNOSIS — IMO0001 Reserved for inherently not codable concepts without codable children: Secondary | ICD-10-CM | POA: Diagnosis not present

## 2013-02-25 LAB — BASIC METABOLIC PANEL
Calcium: 9.9 mg/dL (ref 8.4–10.5)
GFR calc non Af Amer: >90 mL/min (ref >90)
Glucose, Bld: 96 mg/dL (ref 70–99)
Sodium: 138 mEq/L (ref 135–145)

## 2013-02-25 LAB — CBC
Hemoglobin: 12.9 g/dL (ref 12.0–15.0)
MCH: 31.9 pg (ref 26.0–34.0)
MCHC: 34.3 g/dL (ref 30.0–36.0)

## 2013-02-25 NOTE — Patient Instructions (Addendum)
   Your procedure is scheduled on: Monday July 14th  Enter through the Main Entrance of [facility] at: 8 am  Pick up the phone at the desk and dial [phone] and inform us of your arrival.  Please call this number if you have any problems the morning of surgery: [phone]  Remember: Do not eat any liquids or eat any solid foods after midnight on: Monday  Please take these medications ( with sips of water) morning of surgery: Lavalo, Cozaar, Lyrica, Armour, Ultram, and Effexor  Do not wear jewelry, make-up, or FINGER nail polish No metal in your hair or on your body. Do not wear lotions, powders, perfumes. You may wear deodorant.  Please use your CHG wash as directed prior to surgery.  Do not shave anywhere for at least 12 hours prior to first CHG shower.  Do not bring valuables to the hospital. Contacts, Dentures and Partial Plates may not be worn to OR   For patients being discharged-you must have a ride home.

## 2013-02-25 NOTE — Pre-Procedure Instructions (Addendum)
Pt. History, current EKG done today, and 2012 EKG reviewed with Jasmine December, MD.  No orders received. No further work up requested.

## 2013-03-03 ENCOUNTER — Ambulatory Visit (HOSPITAL_COMMUNITY)
Admission: RE | Admit: 2013-03-03 | Discharge: 2013-03-03 | Disposition: A | Discharge status: Home / Self Care | Payer: Medicare Other | Source: Ambulatory Visit | Attending: Obstetrics and Gynecology | Admitting: Obstetrics and Gynecology

## 2013-03-03 ENCOUNTER — Encounter (HOSPITAL_COMMUNITY): Payer: Self-pay | Admitting: *Deleted

## 2013-03-03 ENCOUNTER — Encounter (HOSPITAL_COMMUNITY)
Admission: RE | Disposition: A | Discharge status: Home / Self Care | Payer: Self-pay | Source: Ambulatory Visit | Attending: Obstetrics and Gynecology

## 2013-03-03 ENCOUNTER — Ambulatory Visit (HOSPITAL_COMMUNITY): Payer: Medicare Other | Admitting: Anesthesiology

## 2013-03-03 ENCOUNTER — Encounter (HOSPITAL_COMMUNITY): Payer: Self-pay | Admitting: Anesthesiology

## 2013-03-03 DIAGNOSIS — J449 Chronic obstructive pulmonary disease, unspecified: Secondary | ICD-10-CM | POA: Diagnosis not present

## 2013-03-03 DIAGNOSIS — E7800 Pure hypercholesterolemia, unspecified: Secondary | ICD-10-CM | POA: Insufficient documentation

## 2013-03-03 DIAGNOSIS — IMO0001 Reserved for inherently not codable concepts without codable children: Secondary | ICD-10-CM | POA: Insufficient documentation

## 2013-03-03 DIAGNOSIS — K219 Gastro-esophageal reflux disease without esophagitis: Secondary | ICD-10-CM | POA: Insufficient documentation

## 2013-03-03 DIAGNOSIS — K589 Irritable bowel syndrome without diarrhea: Secondary | ICD-10-CM | POA: Diagnosis not present

## 2013-03-03 DIAGNOSIS — Z79890 Hormone replacement therapy (postmenopausal): Secondary | ICD-10-CM | POA: Insufficient documentation

## 2013-03-03 DIAGNOSIS — N950 Postmenopausal bleeding: Secondary | ICD-10-CM | POA: Insufficient documentation

## 2013-03-03 DIAGNOSIS — C549 Malignant neoplasm of corpus uteri, unspecified: Secondary | ICD-10-CM | POA: Diagnosis not present

## 2013-03-03 HISTORY — PX: DILATATION & CURRETTAGE/HYSTEROSCOPY WITH RESECTOCOPE: SHX5572

## 2013-03-03 SURGERY — DILATATION & CURETTAGE/HYSTEROSCOPY WITH RESECTOCOPE
Anesthesia: General | Site: Uterus | Wound class: Clean Contaminated

## 2013-03-03 MED ORDER — GLYCINE 1.5 % IR SOLN
Status: DC | PRN
  Administered 2013-03-03: 3000 mL

## 2013-03-03 MED ORDER — MIDAZOLAM HCL 2 MG/2ML IJ SOLN
INTRAMUSCULAR | Status: AC
  Filled 2013-03-03: qty 2

## 2013-03-03 MED ORDER — PROPOFOL 10 MG/ML IV EMUL
INTRAVENOUS | Status: AC
  Filled 2013-03-03: qty 20

## 2013-03-03 MED ORDER — FENTANYL CITRATE 0.05 MG/ML IJ SOLN
INTRAMUSCULAR | Status: AC
  Filled 2013-03-03: qty 2

## 2013-03-03 MED ORDER — FENTANYL CITRATE 0.05 MG/ML IJ SOLN: 12.5000 ug | INTRAMUSCULAR | Status: DC | PRN

## 2013-03-03 MED ORDER — FENTANYL CITRATE 0.05 MG/ML IJ SOLN
INTRAMUSCULAR | Status: AC
  Administered 2013-03-03: 50 ug via INTRAVENOUS
  Filled 2013-03-03: qty 2

## 2013-03-03 MED ORDER — LIDOCAINE HCL 1 % IJ SOLN
INTRAMUSCULAR | Status: DC | PRN
  Administered 2013-03-03: 20 mL

## 2013-03-03 MED ORDER — ONDANSETRON HCL 4 MG/2ML IJ SOLN
INTRAMUSCULAR | Status: AC
  Filled 2013-03-03: qty 2

## 2013-03-03 MED ORDER — LIDOCAINE HCL (CARDIAC) 20 MG/ML IV SOLN
INTRAVENOUS | Status: AC
  Filled 2013-03-03: qty 5

## 2013-03-03 MED ORDER — EPHEDRINE 5 MG/ML INJ
5.0000 mg | INTRAVENOUS | Status: DC | PRN
  Administered 2013-03-03 (×6): 5 mg via INTRAVENOUS
  Filled 2013-03-03: qty 1

## 2013-03-03 MED ORDER — LACTATED RINGERS IV SOLN
INTRAVENOUS | Status: DC
  Administered 2013-03-03 (×2): via INTRAVENOUS
  Administered 2013-03-03: 125 mL/h via INTRAVENOUS

## 2013-03-03 MED ORDER — ACETAMINOPHEN 10 MG/ML IV SOLN
INTRAVENOUS | Status: AC
  Administered 2013-03-03: 1000 mg via INTRAVENOUS
  Filled 2013-03-03: qty 100

## 2013-03-03 MED ORDER — ONDANSETRON HCL 4 MG/2ML IJ SOLN: 4.0000 mg | Freq: Four times a day (QID) | INTRAMUSCULAR | Status: DC | PRN

## 2013-03-03 MED ORDER — LIDOCAINE HCL (CARDIAC) 20 MG/ML IV SOLN
INTRAVENOUS | Status: DC | PRN
  Administered 2013-03-03: 130 mg via INTRAVENOUS
  Administered 2013-03-03: 50 mg via INTRAVENOUS

## 2013-03-03 MED ORDER — MIDAZOLAM HCL 5 MG/5ML IJ SOLN
INTRAMUSCULAR | Status: DC | PRN
  Administered 2013-03-03: 2 mg via INTRAVENOUS

## 2013-03-03 MED ORDER — ACETAMINOPHEN 325 MG PO TABS: 650.0000 mg | ORAL_TABLET | ORAL | Status: DC | PRN

## 2013-03-03 MED ORDER — SODIUM CHLORIDE 0.9 % IJ SOLN: 3.0000 mL | INTRAMUSCULAR | Status: DC | PRN

## 2013-03-03 MED ORDER — FENTANYL CITRATE 0.05 MG/ML IJ SOLN
INTRAMUSCULAR | Status: DC | PRN
  Administered 2013-03-03: 100 ug via INTRAVENOUS

## 2013-03-03 MED ORDER — SODIUM CHLORIDE 0.9 % IJ SOLN: 3.0000 mL | Freq: Two times a day (BID) | INTRAMUSCULAR | Status: DC

## 2013-03-03 MED ORDER — DEXAMETHASONE SODIUM PHOSPHATE 10 MG/ML IJ SOLN
INTRAMUSCULAR | Status: AC
  Filled 2013-03-03: qty 1

## 2013-03-03 MED ORDER — EPHEDRINE SULFATE 50 MG/ML IJ SOLN
INTRAMUSCULAR | Status: DC | PRN
  Administered 2013-03-03 (×5): 10 mg via INTRAVENOUS

## 2013-03-03 MED ORDER — EPHEDRINE 5 MG/ML INJ
INTRAVENOUS | Status: AC
  Filled 2013-03-03: qty 10

## 2013-03-03 MED ORDER — SODIUM CHLORIDE 0.9 % IV SOLN: 250.0000 mL | INTRAVENOUS | Status: DC | PRN

## 2013-03-03 MED ORDER — ONDANSETRON HCL 4 MG/2ML IJ SOLN: 4.0000 mg | Freq: Once | INTRAMUSCULAR | Status: DC | PRN

## 2013-03-03 MED ORDER — FENTANYL CITRATE 0.05 MG/ML IJ SOLN
25.0000 ug | INTRAMUSCULAR | Status: DC | PRN
  Administered 2013-03-03 (×2): 50 ug via INTRAVENOUS

## 2013-03-03 MED ORDER — DIPHENHYDRAMINE HCL 50 MG/ML IJ SOLN
INTRAMUSCULAR | Status: AC
  Filled 2013-03-03: qty 1

## 2013-03-03 MED ORDER — DIPHENHYDRAMINE HCL 50 MG/ML IJ SOLN
INTRAMUSCULAR | Status: DC | PRN
  Administered 2013-03-03: 25 mg via INTRAVENOUS

## 2013-03-03 MED ORDER — ACETAMINOPHEN 650 MG RE SUPP
650.0000 mg | RECTAL | Status: DC | PRN
  Filled 2013-03-03: qty 1

## 2013-03-03 MED ORDER — HYDROMORPHONE HCL PF 1 MG/ML IJ SOLN: 0.5000 mg | INTRAMUSCULAR | Status: DC | PRN

## 2013-03-03 MED ORDER — ACETAMINOPHEN 10 MG/ML IV SOLN: 1000.0000 mg | Freq: Once | INTRAVENOUS | Status: AC | PRN

## 2013-03-03 MED ORDER — HYDROMORPHONE HCL PF 1 MG/ML IJ SOLN
INTRAMUSCULAR | Status: AC
  Filled 2013-03-03: qty 1

## 2013-03-03 MED ORDER — MEPERIDINE HCL 25 MG/ML IJ SOLN: 6.2500 mg | INTRAMUSCULAR | Status: DC | PRN

## 2013-03-03 MED ORDER — EPHEDRINE SULFATE 50 MG/ML IJ SOLN
INTRAMUSCULAR | Status: AC
  Administered 2013-03-03: 5 mg
  Filled 2013-03-03: qty 1

## 2013-03-03 SURGICAL SUPPLY — 19 items
CANISTER SUCTION 2500CC (MISCELLANEOUS) ×2 IMPLANT
CATH ROBINSON RED A/P 16FR (CATHETERS) ×2 IMPLANT
CONTAINER PREFILL 10% NBF 60ML (FORM) ×4 IMPLANT
CORD ACTIVE DISPOSABLE (ELECTRODE) ×1
CORD ELECTRO ACTIVE DISP (ELECTRODE) ×1 IMPLANT
DRESSING TELFA 8X3 (GAUZE/BANDAGES/DRESSINGS) ×2 IMPLANT
ELECT LOOP GYNE PRO 24FR (CUTTING LOOP)
ELECT REM PT RETURN 9FT ADLT (ELECTROSURGICAL) ×2
ELECT VAPORTRODE GRVD BAR (ELECTRODE) IMPLANT
ELECTRODE LOOP GYNE PRO 24FR (CUTTING LOOP) IMPLANT
ELECTRODE REM PT RTRN 9FT ADLT (ELECTROSURGICAL) ×1 IMPLANT
GLOVE BIOGEL PI IND STRL 7.0 (GLOVE) ×1 IMPLANT
GLOVE BIOGEL PI INDICATOR 7.0 (GLOVE) ×1
GLOVE ECLIPSE 6.5 STRL STRAW (GLOVE) ×2 IMPLANT
GOWN STRL REIN XL XLG (GOWN DISPOSABLE) ×4 IMPLANT
PACK HYSTEROSCOPY LF (CUSTOM PROCEDURE TRAY) ×2 IMPLANT
PAD OB MATERNITY 4.3X12.25 (PERSONAL CARE ITEMS) ×2 IMPLANT
TOWEL OR 17X24 6PK STRL BLUE (TOWEL DISPOSABLE) ×4 IMPLANT
WATER STERILE IRR 1000ML POUR (IV SOLUTION) ×2 IMPLANT

## 2013-03-03 NOTE — Interval H&P Note (Signed)
History and Physical Interval Note:  03/03/2013 9:20 AM  Shelly Mosley  has presented today for surgery, with the diagnosis of PMB  The various methods of treatment have been discussed with the patient and family. After consideration of risks, benefits and other options for treatment, the patient has consented to  Procedure(s): DILATATION & CURETTAGE/HYSTEROSCOPY WITH RESECTOCOPE (N/A) as a surgical intervention .  The patient's history has been reviewed, patient examined, no change in status, stable for surgery.  I have reviewed the patient's chart and labs.  Questions were answered to the patient's satisfaction.     Brittain Hosie P
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
## 2013-03-03 NOTE — Transfer of Care (Signed)
Immediate Anesthesia Transfer of Care Note  Patient: Shelly Mosley  Procedure(s) Performed: Procedure(s): DILATATION & CURETTAGE/HYSTEROSCOPY WITH RESECTOCOPE (N/A)  Patient Location: PACU  Anesthesia Type:General  Level of Consciousness: awake, alert  and oriented  Airway & Oxygen Therapy: Patient Spontanous Breathing and Patient connected to nasal cannula oxygen  Post-op Assessment: Report given to PACU RN and Post -op Vital signs reviewed and stable  Post vital signs: Reviewed and stable  Complications: No apparent anesthesia complications

## 2013-03-03 NOTE — Anesthesia Postprocedure Evaluation (Signed)
  Anesthesia Post Note  Patient: Shelly Mosley  Procedure(s) Performed: Procedure(s) (LRB): DILATATION & CURETTAGE/HYSTEROSCOPY WITH RESECTOCOPE (N/A)  Anesthesia type: GA  Patient location: PACU  Post pain: Pain level controlled  Post assessment: Post-op Vital signs reviewed  Last Vitals:  Filed Vitals:   03/03/13 1100  BP: 94/49  Pulse: 75  Temp: 36.9 C  Resp: 18    Post vital signs: Reviewed  Level of consciousness: sedated  Complications: No apparent anesthesia complications

## 2013-03-03 NOTE — Op Note (Signed)
Preoperative diagnosis: Recurrent postmenopausal bleeding Postoperative diagnosis: Same, path pending Procedure: Diagnostic hysteroscopy, D. and C. Surgeon: Dr. Aram Beecham Romine Anesthesia: Gen. by LMA Estimated blood loss: Minimal Complications: None Lysing deficit: 0 Procedure: The patient was taken to the operating room and was placed in the low dorsolithotomy position while she was still awake because she had previous back surgery and we wanted to ensure her position was comfortable.  After induction of general anesthesia by LMA, she was prepped and draped in usual fashion.  A red rubber catheter was used to drain the bladder.  A posterior weighted retractor and anterior sims retractor were placed and the cervix was grasped on its anterior and posterior lips with a single-tooth tenaculum.  The uterus sounded to 9 cm.  The cervix was dilated to a #25 Shawnie Pons.  A 5 mm hysteroscope was introduced into the cavity and glycine was used as the distention medium.  The cavity appeared quite atrophic.  The tubal ostia could be clearly seen.  There was a small septum in the fundus.  Photographic documentation was taken.  Endocervix was also normal.  No lesions were seen in the cavity.  This was an essentially normal hysteroscopy.  The scope was withdrawn.  Gentle sharp curettage was done, with scant specimen sent to pathology.  Instrument removed from the vagina, and the procedure was terminated.  At the end of the procedure it was noted that the patient's vulva was erythematous, consistent with an allergic reaction to the prep scrub that had been used.  Since she was allergic to Betadine we used a prep solution called PCMX which contains 3.3% chloroxylenol.  Her vulva was thoroughly rinsed with sterile water on the completion of the procedure and she was given 25 mg of Benadryl IV.  Otherwise she tolerated the procedure well, and went in satisfactory condition to post anesthesia recovery.

## 2013-03-03 NOTE — Anesthesia Preprocedure Evaluation (Signed)
Anesthesia Evaluation  Patient identified by MRN, date of birth, ID band Patient awake    Reviewed: Allergy & Precautions, H&P , NPO status , Patient's Chart, lab work & pertinent test results  Airway Mallampati: I TM Distance: >3 FB Neck ROM: full    Dental no notable dental hx. (+) Teeth Intact   Pulmonary Current Smoker,    Pulmonary exam normal       Cardiovascular hypertension, Pt. on medications     Neuro/Psych PSYCHIATRIC DISORDERS Anxiety Depression    GI/Hepatic Neg liver ROS, GERD-  Medicated and Controlled,  Endo/Other  Morbid obesity  Renal/GU negative Renal ROS  negative genitourinary   Musculoskeletal   Abdominal Normal abdominal exam  (+)   Peds negative pediatric ROS (+)  Hematology negative hematology ROS (+)   Anesthesia Other Findings   Reproductive/Obstetrics negative OB ROS                           Anesthesia Physical Anesthesia Plan  ASA: III  Anesthesia Plan: General   Post-op Pain Management:    Induction: Intravenous  Airway Management Planned: LMA  Additional Equipment:   Intra-op Plan:   Post-operative Plan:   Informed Consent: I have reviewed the patients History and Physical, chart, labs and discussed the procedure including the risks, benefits and alternatives for the proposed anesthesia with the patient or authorized representative who has indicated his/her understanding and acceptance.   History available from chart only  Plan Discussed with: CRNA and Surgeon  Anesthesia Plan Comments:         Anesthesia Quick Evaluation

## 2013-03-04 ENCOUNTER — Telehealth: Payer: Self-pay | Admitting: *Deleted

## 2013-03-04 ENCOUNTER — Encounter (HOSPITAL_COMMUNITY): Payer: Self-pay | Admitting: Obstetrics and Gynecology

## 2013-03-04 NOTE — Telephone Encounter (Signed)
Message copied by Alisa Graff on Tue Mar 04, 2013  2:44 PM ------      Message from: Alison Murray      Created: Tue Mar 04, 2013  2:28 PM       Please call gyn onc and get pt appointment to see them.  I haven't told her yet, so don't let them tell her yet either.  I'd like to tell her on Friday, so on Thursday or Friday, please call her and ask her to come in.  Thanks. ------

## 2013-03-04 NOTE — Telephone Encounter (Signed)
Call to [facility] and scheduled appointment for patient to see Dr Loree Fee on Friday 03-14-13 at 145pm.  Needs to register at 115pm.  She will NOT enter appointment into computer until Monday so we can call patient. Appt here is not entered either to prevent My Chart notification and/or reminder call. Will call patient Friday AM.

## 2013-03-05 DIAGNOSIS — N63 Unspecified lump in unspecified breast: Secondary | ICD-10-CM | POA: Diagnosis not present

## 2013-03-07 ENCOUNTER — Encounter: Payer: Self-pay | Admitting: Obstetrics and Gynecology

## 2013-03-07 ENCOUNTER — Ambulatory Visit (INDEPENDENT_AMBULATORY_CARE_PROVIDER_SITE_OTHER): Payer: Medicare Other | Admitting: Obstetrics and Gynecology

## 2013-03-07 ENCOUNTER — Telehealth: Payer: Self-pay | Admitting: *Deleted

## 2013-03-07 VITALS — BP 116/64 | HR 76 | Resp 16 | Ht 66.5 in | Wt 243.0 lb

## 2013-03-07 DIAGNOSIS — C549 Malignant neoplasm of corpus uteri, unspecified: Secondary | ICD-10-CM

## 2013-03-07 NOTE — Progress Notes (Signed)
67 yo DWF G0P0 4 days post D & C for PMB on HRT.  Pt has had several previous episodes of PMB I did endometrial biopsies on her in 2009 and 2010 that were benign.  In 2012, I biopsied her again, and the biopsy showed glandular crowding and necrosis, so I took her to full D & C which was done Oct 2012 and showed benign endometrium with a polyp.  As follow up, I biopsied her again in May 2013, and it was also benign, with metaplasia.  I recommended she stop her HRT, but this pt has a fairly significant issue with dry eye, and her HRT helps her dry eye, and she fears that without it, her corneas would be damaged, she wouldn't be a candidate for corneal transplant, and she would go blind!  So I have kept her on the HRT.  When she bled again, I rec: another D & C, which was done 4 days ago. At that time, the hysteroscopy showed a very atrophic appearing cavity, with adhesions over the tubal ostia and a very thin appearing white endometrium. The D & C produced a scant specimen.  The pathology came back showing adenocarcinoma.  I called the pathologist and asked for a review since the cavity appeared so atrophic, and he confirmed that this was the correct specimen from the correct patient with the correct diagnosis.  She is here today to discuss results. The photographs I took at hysteroscopy have been scanned into EPIC.    I talked with Grainne about her diagnosis and my recommendation for referral to gyn onc.  We talked about treatment and the patient asked if the hysterecomy could be done laparoscopically.  That decision will be at the discretion of the surgeon.  We also talked about HRT after surgery, since she is almost MORE worried that she'll go blind without HRT than she is that this cancer will kill her.  She is hoping to be able to continue the HRT after surgery.  Appt has been made for her to see Dr. Asa Saunas in one week, and pt instructed.

## 2013-03-07 NOTE — Telephone Encounter (Signed)
Call to patient and scheduled consult visit to day with Dr Tresa Res.

## 2013-03-14 ENCOUNTER — Encounter: Payer: Self-pay | Admitting: Gynecology

## 2013-03-14 ENCOUNTER — Ambulatory Visit: Payer: Medicare Other | Attending: Gynecology | Admitting: Gynecology

## 2013-03-14 ENCOUNTER — Ambulatory Visit: Payer: Self-pay | Admitting: Obstetrics and Gynecology

## 2013-03-14 VITALS — BP 134/64 | HR 80 | Temp 98.7°F | Resp 20 | Ht 67.0 in | Wt 249.0 lb

## 2013-03-14 DIAGNOSIS — Z79899 Other long term (current) drug therapy: Secondary | ICD-10-CM | POA: Insufficient documentation

## 2013-03-14 DIAGNOSIS — K219 Gastro-esophageal reflux disease without esophagitis: Secondary | ICD-10-CM | POA: Insufficient documentation

## 2013-03-14 DIAGNOSIS — J449 Chronic obstructive pulmonary disease, unspecified: Secondary | ICD-10-CM | POA: Insufficient documentation

## 2013-03-14 DIAGNOSIS — Z79890 Hormone replacement therapy (postmenopausal): Secondary | ICD-10-CM | POA: Insufficient documentation

## 2013-03-14 DIAGNOSIS — E785 Hyperlipidemia, unspecified: Secondary | ICD-10-CM | POA: Diagnosis not present

## 2013-03-14 DIAGNOSIS — C549 Malignant neoplasm of corpus uteri, unspecified: Secondary | ICD-10-CM | POA: Insufficient documentation

## 2013-03-14 DIAGNOSIS — E039 Hypothyroidism, unspecified: Secondary | ICD-10-CM | POA: Insufficient documentation

## 2013-03-14 DIAGNOSIS — Z7982 Long term (current) use of aspirin: Secondary | ICD-10-CM | POA: Insufficient documentation

## 2013-03-14 DIAGNOSIS — F17200 Nicotine dependence, unspecified, uncomplicated: Secondary | ICD-10-CM | POA: Diagnosis not present

## 2013-03-14 DIAGNOSIS — J4489 Other specified chronic obstructive pulmonary disease: Secondary | ICD-10-CM | POA: Insufficient documentation

## 2013-03-14 DIAGNOSIS — C541 Malignant neoplasm of endometrium: Secondary | ICD-10-CM

## 2013-03-14 DIAGNOSIS — Z803 Family history of malignant neoplasm of breast: Secondary | ICD-10-CM | POA: Insufficient documentation
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
## 2013-03-14 NOTE — Patient Instructions (Signed)
Surgery scheduled for August 12. Dr. Laurette Schimke will be year surgeon. He will be contacted to arrange a preoperative visit.

## 2013-03-21 ENCOUNTER — Encounter (HOSPITAL_COMMUNITY): Payer: Self-pay | Admitting: Pharmacy Technician

## 2013-03-21 NOTE — Progress Notes (Signed)
Need orders in EPIC.  Surgery scheduled for 04/01/13.  Thank You.  

## 2013-03-26 ENCOUNTER — Other Ambulatory Visit: Payer: Self-pay

## 2013-03-27 NOTE — Patient Instructions (Signed)
Shelly Mosley  03/27/2013   Your procedure is scheduled on:  04/01/13               Surgery 1100am-200pm  Report to [facility] at     0830 AM.  Call this number if you have problems the morning of surgery: [phone]   Remember:             Clear liquid diet beginning 24 hours prior to surgery.    Do not eat food or drink liquids after midnight.   Take these medicines the morning of surgery with A SIP OF WATER:    Do not wear jewelry, make-up or nail polish.  Do not wear lotions, powders, or perfumes.   Do not shave 48 hours prior to surgery.   Do not bring valuables to the hospital.  Contacts, dentures or bridgework may not be worn into surgery.  Leave suitcase in the car. After surgery it may be brought to your room.  For patients admitted to the hospital, checkout time is 11:00 AM the day of  discharge.      SEE CHG INSTRUCTION SHEET    Please read over the following fact sheets that you were given: , coughing and deep breathing exercises, leg exercises, Blood Transfusion Fact Sheet, Incentive SPirometry Fact Sheet                Failure to comply with these instructions may result in cancellation of your surgery.                Patient Signature ____________________________              Nurse Signature _____________________________

## 2013-03-28 ENCOUNTER — Encounter (HOSPITAL_COMMUNITY)
Admission: RE | Admit: 2013-03-28 | Discharge: 2013-03-28 | Disposition: A | Discharge status: Home / Self Care | Payer: Medicare Other | Source: Ambulatory Visit | Attending: Gynecologic Oncology | Admitting: Gynecologic Oncology

## 2013-03-28 ENCOUNTER — Ambulatory Visit (HOSPITAL_COMMUNITY)
Admission: RE | Admit: 2013-03-28 | Discharge: 2013-03-28 | Disposition: A | Discharge status: Home / Self Care | Payer: Medicare Other | Source: Ambulatory Visit | Attending: Gynecologic Oncology | Admitting: Gynecologic Oncology

## 2013-03-28 ENCOUNTER — Encounter (HOSPITAL_COMMUNITY): Payer: Self-pay

## 2013-03-28 DIAGNOSIS — Z01818 Encounter for other preprocedural examination: Secondary | ICD-10-CM | POA: Diagnosis not present

## 2013-03-28 DIAGNOSIS — R0602 Shortness of breath: Secondary | ICD-10-CM | POA: Diagnosis not present

## 2013-03-28 DIAGNOSIS — C549 Malignant neoplasm of corpus uteri, unspecified: Secondary | ICD-10-CM | POA: Diagnosis not present

## 2013-03-28 DIAGNOSIS — Z0183 Encounter for blood typing: Secondary | ICD-10-CM | POA: Diagnosis not present

## 2013-03-28 DIAGNOSIS — Z01812 Encounter for preprocedural laboratory examination: Secondary | ICD-10-CM | POA: Insufficient documentation

## 2013-03-28 HISTORY — DX: Cardiac arrhythmia, unspecified: I49.9

## 2013-03-28 HISTORY — DX: Essential (primary) hypertension: I10

## 2013-03-28 HISTORY — DX: Reserved for concepts with insufficient information to code with codable children: IMO0002

## 2013-03-28 LAB — CBC WITH DIFFERENTIAL/PLATELET
Basophils Relative: 0 % (ref 0–1)
Eosinophils Absolute: 0.1 10*3/uL (ref 0.0–0.7)
Eosinophils Relative: 1 % (ref 0–5)
MCH: 30.7 pg (ref 26.0–34.0)
MCHC: 32.7 g/dL (ref 30.0–36.0)
Neutrophils Relative %: 57 % (ref 43–77)
Platelets: 274 10*3/uL (ref 150–400)
RDW: 12.5 % (ref 11.5–15.5)

## 2013-03-28 LAB — ABO/RH: ABO/RH(D): O POS

## 2013-03-28 LAB — COMPREHENSIVE METABOLIC PANEL
ALT: 35 U/L (ref 0–35)
Albumin: 4 g/dL (ref 3.5–5.2)
Alkaline Phosphatase: 154 U/L — ABNORMAL HIGH (ref 39–117)
Calcium: 10.1 mg/dL (ref 8.4–10.5)
Potassium: 4.8 mEq/L (ref 3.5–5.1)
Sodium: 136 mEq/L (ref 135–145)
Total Protein: 7.6 g/dL (ref 6.0–8.3)

## 2013-03-28 NOTE — Progress Notes (Signed)
Patient allergic to CHG.  Instructed patient to use Dial Soap instead of CHG.

## 2013-03-28 NOTE — Progress Notes (Signed)
Patient has bulging discs cervical, thoracic, and lumbar.  Also has detached ribs on left.

## 2013-03-31 ENCOUNTER — Telehealth: Payer: Self-pay | Admitting: Gynecologic Oncology

## 2013-03-31 NOTE — Telephone Encounter (Signed)
Telephone call to check on pre-operative status.  Patient complaint with pre-operative instructions.  Reinforced NPO after midnight.  No questions or concerns voiced.  Instructed to call for any needs. 

## 2013-04-01 ENCOUNTER — Inpatient Hospital Stay (HOSPITAL_COMMUNITY): Payer: Medicare Other | Admitting: Anesthesiology

## 2013-04-01 ENCOUNTER — Inpatient Hospital Stay (HOSPITAL_COMMUNITY)
Admission: RE | Admit: 2013-04-01 | Discharge: 2013-04-02 | DRG: 741 | Disposition: A | Discharge status: Home / Self Care | Payer: Medicare Other | Source: Ambulatory Visit | Attending: Obstetrics & Gynecology | Admitting: Obstetrics & Gynecology

## 2013-04-01 ENCOUNTER — Encounter (HOSPITAL_COMMUNITY)
Admission: RE | Disposition: A | Discharge status: Home / Self Care | Payer: Self-pay | Source: Ambulatory Visit | Attending: Obstetrics & Gynecology

## 2013-04-01 ENCOUNTER — Encounter (HOSPITAL_COMMUNITY): Payer: Self-pay | Admitting: *Deleted

## 2013-04-01 ENCOUNTER — Encounter (HOSPITAL_COMMUNITY): Payer: Self-pay | Admitting: Anesthesiology

## 2013-04-01 DIAGNOSIS — G8929 Other chronic pain: Secondary | ICD-10-CM | POA: Diagnosis present

## 2013-04-01 DIAGNOSIS — M549 Dorsalgia, unspecified: Secondary | ICD-10-CM | POA: Diagnosis present

## 2013-04-01 DIAGNOSIS — Z79899 Other long term (current) drug therapy: Secondary | ICD-10-CM

## 2013-04-01 DIAGNOSIS — K589 Irritable bowel syndrome without diarrhea: Secondary | ICD-10-CM | POA: Diagnosis present

## 2013-04-01 DIAGNOSIS — J4489 Other specified chronic obstructive pulmonary disease: Secondary | ICD-10-CM | POA: Diagnosis not present

## 2013-04-01 DIAGNOSIS — G609 Hereditary and idiopathic neuropathy, unspecified: Secondary | ICD-10-CM | POA: Diagnosis present

## 2013-04-01 DIAGNOSIS — F411 Generalized anxiety disorder: Secondary | ICD-10-CM | POA: Diagnosis present

## 2013-04-01 DIAGNOSIS — E785 Hyperlipidemia, unspecified: Secondary | ICD-10-CM | POA: Diagnosis present

## 2013-04-01 DIAGNOSIS — K219 Gastro-esophageal reflux disease without esophagitis: Secondary | ICD-10-CM | POA: Diagnosis present

## 2013-04-01 DIAGNOSIS — E039 Hypothyroidism, unspecified: Secondary | ICD-10-CM | POA: Diagnosis not present

## 2013-04-01 DIAGNOSIS — F3289 Other specified depressive episodes: Secondary | ICD-10-CM | POA: Diagnosis present

## 2013-04-01 DIAGNOSIS — F329 Major depressive disorder, single episode, unspecified: Secondary | ICD-10-CM | POA: Diagnosis present

## 2013-04-01 DIAGNOSIS — C549 Malignant neoplasm of corpus uteri, unspecified: Principal | ICD-10-CM | POA: Diagnosis present

## 2013-04-01 DIAGNOSIS — N8502 Endometrial intraepithelial neoplasia [EIN]: Secondary | ICD-10-CM | POA: Diagnosis not present

## 2013-04-01 DIAGNOSIS — F17200 Nicotine dependence, unspecified, uncomplicated: Secondary | ICD-10-CM | POA: Diagnosis present

## 2013-04-01 DIAGNOSIS — M1710 Unilateral primary osteoarthritis, unspecified knee: Secondary | ICD-10-CM | POA: Diagnosis present

## 2013-04-01 DIAGNOSIS — C541 Malignant neoplasm of endometrium: Secondary | ICD-10-CM | POA: Diagnosis present

## 2013-04-01 DIAGNOSIS — IMO0001 Reserved for inherently not codable concepts without codable children: Secondary | ICD-10-CM | POA: Diagnosis present

## 2013-04-01 DIAGNOSIS — J449 Chronic obstructive pulmonary disease, unspecified: Secondary | ICD-10-CM | POA: Diagnosis present

## 2013-04-01 HISTORY — PX: ROBOTIC ASSISTED TOTAL HYSTERECTOMY WITH BILATERAL SALPINGO OOPHERECTOMY: SHX6086

## 2013-04-01 LAB — TYPE AND SCREEN: ABO/RH(D): O POS

## 2013-04-01 SURGERY — ROBOTIC ASSISTED TOTAL HYSTERECTOMY WITH BILATERAL SALPINGO OOPHORECTOMY
Anesthesia: General | Laterality: Bilateral | Wound class: Clean Contaminated

## 2013-04-01 MED ORDER — DIPHENHYDRAMINE HCL 50 MG/ML IJ SOLN
INTRAMUSCULAR | Status: DC | PRN
  Administered 2013-04-01: 25 mg via INTRAVENOUS

## 2013-04-01 MED ORDER — ATROPINE SULFATE 0.4 MG/ML IJ SOLN
INTRAMUSCULAR | Status: DC | PRN
  Administered 2013-04-01: 0.4 mg via INTRAVENOUS

## 2013-04-01 MED ORDER — LIDOCAINE HCL (CARDIAC) 20 MG/ML IV SOLN
INTRAVENOUS | Status: DC | PRN
  Administered 2013-04-01: 50 mg via INTRAVENOUS

## 2013-04-01 MED ORDER — ROCURONIUM BROMIDE 100 MG/10ML IV SOLN
INTRAVENOUS | Status: DC | PRN
  Administered 2013-04-01: 10 mg via INTRAVENOUS
  Administered 2013-04-01: 50 mg via INTRAVENOUS

## 2013-04-01 MED ORDER — THYROID 120 MG PO TABS
120.0000 mg | ORAL_TABLET | Freq: Every morning | ORAL | Status: DC
  Administered 2013-04-02: 120 mg via ORAL
  Filled 2013-04-01: qty 1

## 2013-04-01 MED ORDER — VENLAFAXINE HCL 37.5 MG PO TABS
62.5000 mg | ORAL_TABLET | Freq: Every morning | ORAL | Status: DC
  Administered 2013-04-02: 62.5 mg via ORAL
  Filled 2013-04-01: qty 1

## 2013-04-01 MED ORDER — PROPOFOL 10 MG/ML IV BOLUS
INTRAVENOUS | Status: DC | PRN
  Administered 2013-04-01: 150 mg via INTRAVENOUS

## 2013-04-01 MED ORDER — ZOLPIDEM TARTRATE 5 MG PO TABS: 5.0000 mg | ORAL_TABLET | Freq: Every evening | ORAL | Status: DC | PRN

## 2013-04-01 MED ORDER — LACTATED RINGERS IV SOLN
INTRAVENOUS | Status: DC
  Administered 2013-04-01 (×2): via INTRAVENOUS
  Administered 2013-04-01: 1000 mL via INTRAVENOUS

## 2013-04-01 MED ORDER — HYDROMORPHONE HCL PF 1 MG/ML IJ SOLN
INTRAMUSCULAR | Status: DC | PRN
  Administered 2013-04-01: 1 mg via INTRAVENOUS

## 2013-04-01 MED ORDER — GLYCOPYRROLATE 0.2 MG/ML IJ SOLN
INTRAMUSCULAR | Status: DC | PRN
  Administered 2013-04-01: 0.2 mg via INTRAVENOUS
  Administered 2013-04-01: 0.4 mg via INTRAVENOUS

## 2013-04-01 MED ORDER — CEFAZOLIN SODIUM-DEXTROSE 2-3 GM-% IV SOLR
2.0000 g | INTRAVENOUS | Status: AC
  Administered 2013-04-01: 2 g via INTRAVENOUS

## 2013-04-01 MED ORDER — ACETAMINOPHEN 500 MG PO TABS
1000.0000 mg | ORAL_TABLET | Freq: Four times a day (QID) | ORAL | Status: DC
  Administered 2013-04-01 – 2013-04-02 (×2): 1000 mg via ORAL
  Filled 2013-04-01 (×6): qty 2

## 2013-04-01 MED ORDER — LOSARTAN POTASSIUM 50 MG PO TABS
50.0000 mg | ORAL_TABLET | Freq: Two times a day (BID) | ORAL | Status: DC
  Administered 2013-04-01: 50 mg via ORAL
  Filled 2013-04-01 (×3): qty 1

## 2013-04-01 MED ORDER — FENTANYL CITRATE 0.05 MG/ML IJ SOLN
50.0000 ug | INTRAMUSCULAR | Status: DC | PRN
  Administered 2013-04-01: 100 ug via INTRAVENOUS

## 2013-04-01 MED ORDER — BUDESONIDE-FORMOTEROL FUMARATE 80-4.5 MCG/ACT IN AERO
1.0000 | INHALATION_SPRAY | Freq: Every day | RESPIRATORY_TRACT | Status: DC | PRN
  Filled 2013-04-01: qty 6.9

## 2013-04-01 MED ORDER — HYDROMORPHONE HCL PF 1 MG/ML IJ SOLN: 0.2500 mg | INTRAMUSCULAR | Status: DC | PRN

## 2013-04-01 MED ORDER — PANTOPRAZOLE SODIUM 40 MG PO TBEC
40.0000 mg | DELAYED_RELEASE_TABLET | Freq: Every day | ORAL | Status: DC
  Administered 2013-04-01 – 2013-04-02 (×2): 40 mg via ORAL
  Filled 2013-04-01 (×2): qty 1

## 2013-04-01 MED ORDER — LORAZEPAM 0.5 MG PO TABS
0.5000 mg | ORAL_TABLET | Freq: Three times a day (TID) | ORAL | Status: DC | PRN
  Administered 2013-04-01: 0.5 mg via ORAL
  Filled 2013-04-01: qty 1

## 2013-04-01 MED ORDER — ONDANSETRON HCL 4 MG/2ML IJ SOLN
4.0000 mg | Freq: Four times a day (QID) | INTRAMUSCULAR | Status: DC | PRN
Start: 2013-04-01 — End: 2013-04-02

## 2013-04-01 MED ORDER — FENTANYL CITRATE 0.05 MG/ML IJ SOLN
INTRAMUSCULAR | Status: DC | PRN
  Administered 2013-04-01 (×4): 50 ug via INTRAVENOUS

## 2013-04-01 MED ORDER — MIDAZOLAM HCL 5 MG/5ML IJ SOLN
INTRAMUSCULAR | Status: DC | PRN
  Administered 2013-04-01: 2 mg via INTRAVENOUS

## 2013-04-01 MED ORDER — ONDANSETRON HCL 4 MG PO TABS: 4.0000 mg | ORAL_TABLET | Freq: Four times a day (QID) | ORAL | Status: DC | PRN

## 2013-04-01 MED ORDER — PREGABALIN 50 MG PO CAPS
100.0000 mg | ORAL_CAPSULE | Freq: Three times a day (TID) | ORAL | Status: DC
  Administered 2013-04-01 – 2013-04-02 (×3): 100 mg via ORAL
  Filled 2013-04-01 (×3): qty 2

## 2013-04-01 MED ORDER — HYDROMORPHONE HCL PF 1 MG/ML IJ SOLN
0.5000 mg | INTRAMUSCULAR | Status: DC | PRN
  Administered 2013-04-01 – 2013-04-02 (×2): 0.5 mg via INTRAVENOUS
  Filled 2013-04-01 (×2): qty 1

## 2013-04-01 MED ORDER — LACTATED RINGERS IR SOLN
Status: DC | PRN
  Administered 2013-04-01: 1000 mL

## 2013-04-01 MED ORDER — ENOXAPARIN SODIUM 40 MG/0.4ML ~~LOC~~ SOLN
40.0000 mg | SUBCUTANEOUS | Status: AC
  Administered 2013-04-01: 40 mg via SUBCUTANEOUS
  Filled 2013-04-01: qty 0.4

## 2013-04-01 MED ORDER — TRAMADOL HCL 50 MG PO TABS
100.0000 mg | ORAL_TABLET | Freq: Four times a day (QID) | ORAL | Status: DC | PRN
  Administered 2013-04-02 (×2): 100 mg via ORAL
  Filled 2013-04-01 (×2): qty 2

## 2013-04-01 MED ORDER — STERILE WATER FOR IRRIGATION IR SOLN
Status: DC | PRN
  Administered 2013-04-01: 3000 mL

## 2013-04-01 MED ORDER — ONDANSETRON HCL 4 MG/2ML IJ SOLN
INTRAMUSCULAR | Status: DC | PRN
  Administered 2013-04-01: 4 mg via INTRAVENOUS

## 2013-04-01 MED ORDER — NEOSTIGMINE METHYLSULFATE 1 MG/ML IJ SOLN
INTRAMUSCULAR | Status: DC | PRN
  Administered 2013-04-01: 3 mg via INTRAVENOUS

## 2013-04-01 MED ORDER — KCL IN DEXTROSE-NACL 20-5-0.45 MEQ/L-%-% IV SOLN
INTRAVENOUS | Status: DC
  Administered 2013-04-01: 18:00:00 via INTRAVENOUS
  Filled 2013-04-01 (×2): qty 1000

## 2013-04-01 MED ORDER — ENOXAPARIN SODIUM 40 MG/0.4ML ~~LOC~~ SOLN
40.0000 mg | SUBCUTANEOUS | Status: DC
  Administered 2013-04-02: 40 mg via SUBCUTANEOUS
  Filled 2013-04-01: qty 0.4

## 2013-04-01 SURGICAL SUPPLY — 53 items
APL SKNCLS STERI-STRIP NONHPOA (GAUZE/BANDAGES/DRESSINGS) ×1
BAG SPEC RTRVL LRG 6X4 10 (ENDOMECHANICALS) ×1
BENZOIN TINCTURE PRP APPL 2/3 (GAUZE/BANDAGES/DRESSINGS) ×2 IMPLANT
CHLORAPREP W/TINT 26ML (MISCELLANEOUS) ×1 IMPLANT
CLOTH BEACON ORANGE TIMEOUT ST (SAFETY) ×2 IMPLANT
CORD HIGH FREQUENCY UNIPOLAR (ELECTROSURGICAL) ×1 IMPLANT
CORDS BIPOLAR (ELECTRODE) ×2 IMPLANT
COVER MAYO STAND STRL (DRAPES) ×2 IMPLANT
COVER SURGICAL LIGHT HANDLE (MISCELLANEOUS) ×2 IMPLANT
COVER TIP SHEARS 8 DVNC (MISCELLANEOUS) ×1 IMPLANT
COVER TIP SHEARS 8MM DA VINCI (MISCELLANEOUS) ×1
DECANTER SPIKE VIAL GLASS SM (MISCELLANEOUS) ×1 IMPLANT
DRAPE LG THREE QUARTER DISP (DRAPES) ×5 IMPLANT
DRAPE SURG IRRIG POUCH 19X23 (DRAPES) ×2 IMPLANT
DRAPE TABLE BACK 44X90 PK DISP (DRAPES) ×4 IMPLANT
DRAPE UTILITY XL STRL (DRAPES) ×2 IMPLANT
DRAPE WARM FLUID 44X44 (DRAPE) ×2 IMPLANT
DRSG TEGADERM 6X8 (GAUZE/BANDAGES/DRESSINGS) ×5 IMPLANT
ELECT REM PT RETURN 9FT ADLT (ELECTROSURGICAL) ×2
ELECTRODE REM PT RTRN 9FT ADLT (ELECTROSURGICAL) ×1 IMPLANT
FILTER SMOKE EVAC LAPAROSHD (FILTER) ×1 IMPLANT
GAUZE VASELINE 3X9 (GAUZE/BANDAGES/DRESSINGS) IMPLANT
GLOVE BIO SURGEON STRL SZ 6.5 (GLOVE) ×6 IMPLANT
GLOVE BIO SURGEON STRL SZ7.5 (GLOVE) ×4 IMPLANT
GLOVE INDICATOR 8.0 STRL GRN (GLOVE) ×4 IMPLANT
GOWN PREVENTION PLUS XLARGE (GOWN DISPOSABLE) ×5 IMPLANT
GOWN STRL REIN XL XLG (GOWN DISPOSABLE) ×4 IMPLANT
HOLDER FOLEY CATH W/STRAP (MISCELLANEOUS) ×1 IMPLANT
KIT ACCESSORY DA VINCI DISP (KITS) ×1
KIT ACCESSORY DVNC DISP (KITS) ×1 IMPLANT
MANIPULATOR UTERINE 4.5 ZUMI (MISCELLANEOUS) ×2 IMPLANT
OCCLUDER COLPOPNEUMO (BALLOONS) ×2 IMPLANT
PACK LAPAROSCOPY W LONG (CUSTOM PROCEDURE TRAY) ×2 IMPLANT
POUCH SPECIMEN RETRIEVAL 10MM (ENDOMECHANICALS) ×3 IMPLANT
SET TUBE IRRIG SUCTION NO TIP (IRRIGATION / IRRIGATOR) ×2 IMPLANT
SHEET LAVH (DRAPES) ×2 IMPLANT
SOLUTION ANTI FOG 6CC (MISCELLANEOUS) ×1 IMPLANT
SOLUTION ELECTROLUBE (MISCELLANEOUS) ×2 IMPLANT
SPONGE LAP 18X18 X RAY DECT (DISPOSABLE) IMPLANT
STRIP CLOSURE SKIN 1/2X4 (GAUZE/BANDAGES/DRESSINGS) ×2 IMPLANT
SUT VIC AB 0 CT1 27 (SUTURE) ×2
SUT VIC AB 0 CT1 27XBRD ANTBC (SUTURE) ×1 IMPLANT
SUT VIC AB 4-0 PS2 27 (SUTURE) ×4 IMPLANT
SUT VICRYL 0 UR6 27IN ABS (SUTURE) ×2 IMPLANT
SYR 50ML LL SCALE MARK (SYRINGE) ×2 IMPLANT
SYR BULB IRRIGATION 50ML (SYRINGE) IMPLANT
TOWEL OR 17X26 10 PK STRL BLUE (TOWEL DISPOSABLE) ×4 IMPLANT
TRAP SPECIMEN MUCOUS 40CC (MISCELLANEOUS) IMPLANT
TRAY FOLEY CATH 14FRSI W/METER (CATHETERS) ×2 IMPLANT
TROCAR 12M 150ML BLUNT (TROCAR) ×2 IMPLANT
TROCAR BLADELESS OPT 5 75 (ENDOMECHANICALS) ×2 IMPLANT
TROCAR XCEL 12X100 BLDLESS (ENDOMECHANICALS) ×2 IMPLANT
WATER STERILE IRR 1500ML POUR (IV SOLUTION) ×2 IMPLANT

## 2013-04-01 NOTE — Interval H&P Note (Signed)
History and Physical Interval Note:  04/01/2013 11:23 AM  Shelly Mosley  has presented today for surgery, with the diagnosis of Endometrial cancer  The various methods of treatment have been discussed with the patient and family. After consideration of risks, benefits and other options for treatment, the patient has consented to  Procedure(s): ROBOTIC ASSISTED TOTAL HYSTERECTOMY WITH BILATERAL SALPINGO OOPHORECTOMY/POSSIBLE LYMPHADENECTOMY (Bilateral) as a surgical intervention .  The patient's history has been reviewed, patient examined, no change in status, stable for surgery.  I have reviewed the patient's chart and labs.  Questions were answered to the patient's satisfaction.     [place], [facility]

## 2013-04-01 NOTE — Op Note (Signed)
Preoperative Diagnosis: Grade 1 endometrial cancer  Postoperative Diagnosis: Stage IA grade 1 endometrial cancer  Procedure(s) Performed: Robotic total laparoscopic hysterectomy, Bilateral salpingo oophorectomy,  bilateral pelvic lymph node dissection  Anesthesia: Gen. endotracheal  Surgeon: Maryclare Labrador.  Nelly Rout, M.D. PhD  Assistant Surgeon: Antionette Char MD.   Specimens: Uterus cervix, ovaries tubes bilateral pelvic lymph node  Estimated Blood Loss: 150 mL.    Complications: None  Indication for Procedure: This is a 67 y.o. -old with uterine curettings demonstrated grade 1 endometrial cancer.  Operative Findings:  9 cm uterus. No adnexa. No masses.   Frozen pathology was consistent with no evidence of residual disease  Procedure: Patient was taken to the operating room and placed under general endotracheal anesthesia without any difficulty. She is placed in the dorsal lithotomy position and secured to the operative table over the chest with tape.   The patient was prepped and draped and the uterine manipulator placed within the endometrial cavity. The appropriately sized Koh ring was circumferentially around the cervix. The balloon was placed within the vagina. An OG tube was present and functional. At an area on the left in line with the nipple approximately 2 cm below the ribs the area was infiltrated with 1% lidocaine and a 5 mm Optiview inserted under direct visualization. The abdomen was insufflated to 15 mm of mercury and the pressure never deviated above that throughout the remainder of the procedure. Maximum Trendelenburg positioning was obtained. At approximately 22 cm proximal to the symphysis pubis an incision was made just superior to the umbilicus. This area was infiltrated with lidocaine as well as the location 10 cm lateral to this incision and 2 cm superior to the left anterior superior iliac spine. Incisions were made. 10 mm trocar was inserted in the superior umbilicus  incision. Millimeter robotic ports were placed in the other 3 incisions. The left upper quadrant port site was replaced with a 10 mm port. This was all completed under direct visualization. The small and large bowel were reflected as much as possible into the upper abdomen.  Adhesions of the small bowel to the anterior abdominal wall were noted in the right upper abdomen. These adhesions were not resected The robot was docked and instruments placed.  The right round ligament was transected and the ureter was identified. The right infundibulopelvic ligament was cauterized and transected The retroperitoneal space was entered on the right and the peritoneum incised to the level of the vesicouterine ligament anteriorly. The bladder flap was created using Bovie cautery. The peritoneal dissection was continued inferiorly and across the inferior most aspect of the cervix. In this manner the urethra was deflected inferiorly. The bladder flap was further developed. The uterine vessels on the right were skeletonized ligated and transected.  Left pelvic sidewall adhesions to the rectosigmoid were identified and transected. The left ureter was identified. The left gonadal vessels were cauterized and transected. The broad ligament was skeletonized posteriorly to the level of the cervix and the peritoneum dissected free from the cervix and in this fashion the ureter was deflected inferiorly. The anterior peritoneum was further dissected and the bladder flap appropriately developed. The uterine vessels were skeletonized cauterized and transected. The balloon and the vagina was then maximally insufflated. A colpotomy incision was made circumferentially and the uterus cervix ovaries and tubes were ivered from the vagina. The Koh ring was removed and the balloon was replaced.  Right pelvic lymph node dissection was then initiated. The superior vesicle artery was identified and  the vesicouterine space developed. The obturator  nerve was identified. Nodal tissue was removed within the boundaries of the right genitofemoral nerve the right circumflex vein, the ureter and the superior vesicle artery. Bleeding vessel was noted just inferior to the right obturator nerve. This was cauterized and hemostasis achieved. The nodal tissue was placed in an Endo Catch bag. The left pelvic lymph node dissection was then initiated. The superior vesicle artery was identified and the vesicouterine space developed. The obturator nerve was identified. Nodal tissue was removed within the boundaries of the left  genitofemoral nerve the left circumflex vein, the ureter and the superior vesicle artery. The nodal tissue was placed in an Endo Catch bag.   At that time the frozen section evaluation returned and it was significant for the absence of residual tumor   The specimens were removed through the vagina. The vaginal balloon was reinserted.  The pelvis was copiously irrigated and drained and hemostasis was assured. The vaginal cuff was closed with a running 0 Vicryl suture ligature. The needle was removed under direct visualization. The operative site is once again visualized and hemostasis was assured. The instruments were removed from the abdomen and pelvis and the port sites irrigated. The umbilical fascia was closed with an interrupted 0 Vicryl  suture. The subcutaneous tissue of the umbilical left upper quadrant and right lower quadrant subcutaneous tissues were approximated with a single suture. Skin incisions were closed with a subcuticular suture.  The vaginal vault was cleared with a moist sponge stick.  Sponge, lap and needle counts were correct x 3.    The patient had sequential compression devices and preoperative Lovenox for VTE prophylaxis         Disposition: PACU - hemodynamically stable.         Condition:stable Foley draining clear urine.
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
## 2013-04-01 NOTE — Transfer of Care (Signed)
Immediate Anesthesia Transfer of Care Note  Patient: Shelly Mosley  Procedure(s) Performed: Procedure(s): ROBOTIC ASSISTED TOTAL HYSTERECTOMY WITH BILATERAL SALPINGO OOPHORECTOMY/LYMPHADENECTOMY (Bilateral)  Patient Location: PACU  Anesthesia Type:General  Level of Consciousness: awake and alert   Airway & Oxygen Therapy: Patient Spontanous Breathing and Patient connected to face mask oxygen  Post-op Assessment: Report given to PACU RN and Post -op Vital signs reviewed and stable  Post vital signs: Reviewed and stable  Complications: No apparent anesthesia complications

## 2013-04-02 ENCOUNTER — Encounter (HOSPITAL_COMMUNITY): Payer: Self-pay | Admitting: Gynecologic Oncology

## 2013-04-02 LAB — BASIC METABOLIC PANEL
Calcium: 9.1 mg/dL (ref 8.4–10.5)
GFR calc Af Amer: >90 mL/min (ref >90)
GFR calc non Af Amer: 89 mL/min — ABNORMAL LOW (ref >90)
Potassium: 4.1 mEq/L (ref 3.5–5.1)
Sodium: 138 mEq/L (ref 135–145)

## 2013-04-02 LAB — CBC
Hemoglobin: 10.9 g/dL — ABNORMAL LOW (ref 12.0–15.0)
MCHC: 32.7 g/dL (ref 30.0–36.0)
Platelets: 226 10*3/uL (ref 150–400)
RBC: 3.53 MIL/uL — ABNORMAL LOW (ref 3.87–5.11)

## 2013-04-02 MED ORDER — TRAMADOL HCL 50 MG PO TABS: 50.0000 mg | ORAL_TABLET | Freq: Four times a day (QID) | ORAL | Status: DC | PRN

## 2013-04-02 NOTE — Anesthesia Postprocedure Evaluation (Signed)
  Anesthesia Post-op Note  Patient: Shelly Mosley  Procedure(s) Performed: Procedure(s) (LRB): ROBOTIC ASSISTED TOTAL HYSTERECTOMY WITH BILATERAL SALPINGO OOPHORECTOMY/LYMPHADENECTOMY (Bilateral)  Patient Location: PACU  Anesthesia Type: General  Level of Consciousness: awake and alert   Airway and Oxygen Therapy: Patient Spontanous Breathing  Post-op Pain: mild  Post-op Assessment: Post-op Vital signs reviewed, Patient's Cardiovascular Status Stable, Respiratory Function Stable, Patent Airway and No signs of Nausea or vomiting  Last Vitals:  Filed Vitals:   04/02/13 0521  BP: 97/58  Pulse: 70  Temp: 36.9 C  Resp: 18    Post-op Vital Signs: stable   Complications: No apparent anesthesia complications

## 2013-04-02 NOTE — Discharge Summary (Signed)
Physician Discharge Summary  Patient ID: Shelly Mosley MRN: [id number] DOB/AGE: 12/29/1945 67 y.o.  Admit date: 04/01/2013 Discharge date: 04/02/2013  Admission Diagnoses: Endometrial cancer  Discharge Diagnoses:  Principal Problem:   Endometrial cancer  Discharged Condition:  The patient is in good condition and stable for discharge.    Hospital Course: On 04/01/2013, the patient underwent the following: Procedure(s):  ROBOTIC ASSISTED TOTAL HYSTERECTOMY WITH BILATERAL SALPINGO OOPHORECTOMY/LYMPHADENECTOMY.  The postoperative course was uneventful.  She was discharged to home on postoperative day 1 tolerating a regular diet.  Consults: None  Significant Diagnostic Studies: None  Treatments: surgery: see above  Discharge Exam: Blood pressure 97/58, pulse 70, temperature 98.5 F (36.9 C), temperature source Oral, resp. rate 18, height 5\' 7"  (1.702 m), weight 222 lb 8.9 oz (100.95 kg), SpO2 94.00%. General appearance: alert, cooperative and no distress Resp: clear to auscultation bilaterally Cardio: regular rate and rhythm, S1, S2 normal, no murmur, click, rub or gallop GI: soft, non-tender; bowel sounds normal; no masses,  no organomegaly Extremities: extremities normal, atraumatic, no cyanosis or edema Incision/Wound: Lap sites to abdomen clean, dry, and intact with steri strips  Disposition: 01-Home or Self Care  Discharge Orders   Future Appointments Provider Department Dept Phone   05/22/2013 11:45 AM Laurette Schimke, MD [facility] [phone]   01/26/2014 9:00 AM Lauro Franklin, FNP [facility] [phone]   Future Orders Complete By Expires   Call MD for:  difficulty breathing, headache or visual disturbances  As directed    Call MD for:  extreme fatigue  As directed    Call MD for:  hives  As directed    Call MD for:  persistant dizziness or light-headedness  As directed    Call MD for:  persistant nausea and  vomiting  As directed    Call MD for:  redness, tenderness, or signs of infection (pain, swelling, redness, odor or green/yellow discharge around incision site)  As directed    Call MD for:  severe uncontrolled pain  As directed    Call MD for:  temperature >100.4  As directed    Diet - low sodium heart healthy  As directed    Driving Restrictions  As directed    Comments:     No driving for 1 week.  Do not take narcotics and drive.   Increase activity slowly  As directed    Lifting restrictions  As directed    Comments:     No lifting greater than 10 lbs.   Sexual Activity Restrictions  As directed    Comments:     No sexual activity, nothing in the vagina, for 8 weeks.       Medication List    STOP taking these medications       estradiol 0.05 MG/24HR patch  Commonly known as:  VIVELLE-DOT     progesterone 100 MG capsule  Commonly known as:  PROMETRIUM     Testosterone 10 MG/ACT (2%) Gel      TAKE these medications       aspirin 81 MG tablet  Take 81 mg by mouth every morning.     budesonide-formoterol 80-4.5 MCG/ACT inhaler  Commonly known as:  SYMBICORT  Inhale 1 puff into the lungs daily as needed (wheezing).     CALCIUM CITRATE + D PO  Take 1 tablet by mouth 2 (two) times daily.     cholecalciferol 1000 UNITS tablet  Commonly known as:  VITAMIN  D  Take 1,000 Units by mouth 2 (two) times daily.     furosemide 40 MG tablet  Commonly known as:  LASIX  Take 40 mg by mouth daily as needed for fluid or edema.     GENTEAL 0.25-0.3 % Gel  Generic drug:  Carboxymethylcell-Hypromellose  Apply 1 application to eye 2 (two) times daily as needed (dry eyes).     hydroxypropyl cellulose 5 MG Inst  Place 5 mg into both eyes at bedtime.     LIVALO 2 MG Tabs  Generic drug:  Pitavastatin Calcium  Take 2 mg by mouth every other day.     LORazepam 0.5 MG tablet  Commonly known as:  ATIVAN  Take 0.5 mg by mouth every 8 (eight) hours as needed for anxiety.      losartan 50 MG tablet  Commonly known as:  COZAAR  Take 50 mg by mouth 2 (two) times daily.     multivitamin tablet  Take 1 tablet by mouth daily.     niacin 1000 MG CR tablet  Commonly known as:  NIASPAN  Take 1,000 mg by mouth at bedtime.     omega-3 acid ethyl esters 1 G capsule  Commonly known as:  LOVAZA  Take 2 g by mouth 2 (two) times daily.     omeprazole 10 MG capsule  Commonly known as:  PRILOSEC  Take 10 mg by mouth daily as needed (heart burn).     pregabalin 100 MG capsule  Commonly known as:  LYRICA  Take 100 mg by mouth 3 (three) times daily.     thyroid 60 MG tablet  Commonly known as:  ARMOUR  Take 120 mg by mouth every morning.     traMADol 50 MG tablet  Commonly known as:  ULTRAM  Take 1-2 tablets (50-100 mg total) by mouth every 6 (six) hours as needed (moderate to severe pain).     venlafaxine 25 MG tablet  Commonly known as:  EFFEXOR  Take 62.5 mg by mouth every morning.           Follow-up Information   Follow up with Laurette Schimke, MD On 05/22/2013. (at 11:45am at the [facility])    Specialty:  Obstetrics and Gynecology   Contact information:   [address] [place] [address] [phone]       Greater than thirty minutes were spend for face to face discharge instructions and discharge orders/summary in EPIC.   Signed: Curby Carswell DEAL 04/02/2013, 9:04 AM

## 2013-04-16 ENCOUNTER — Other Ambulatory Visit: Payer: Self-pay | Admitting: Endocrinology

## 2013-04-16 DIAGNOSIS — R609 Edema, unspecified: Secondary | ICD-10-CM | POA: Diagnosis not present

## 2013-04-17 ENCOUNTER — Ambulatory Visit
Admission: RE | Admit: 2013-04-17 | Discharge: 2013-04-17 | Disposition: A | Discharge status: Home / Self Care | Payer: Medicare Other | Source: Ambulatory Visit | Attending: Endocrinology | Admitting: Endocrinology

## 2013-04-17 DIAGNOSIS — M79609 Pain in unspecified limb: Secondary | ICD-10-CM | POA: Diagnosis not present

## 2013-04-17 DIAGNOSIS — R609 Edema, unspecified: Secondary | ICD-10-CM

## 2013-04-23 DIAGNOSIS — Z8542 Personal history of malignant neoplasm of other parts of uterus: Secondary | ICD-10-CM | POA: Diagnosis not present

## 2013-04-23 DIAGNOSIS — I890 Lymphedema, not elsewhere classified: Secondary | ICD-10-CM | POA: Diagnosis not present

## 2013-04-29 ENCOUNTER — Ambulatory Visit: Payer: Medicare Other | Attending: Gynecologic Oncology | Admitting: Gynecologic Oncology

## 2013-04-29 ENCOUNTER — Encounter: Payer: Self-pay | Admitting: Gynecologic Oncology

## 2013-04-29 VITALS — BP 126/88 | HR 79 | Temp 97.9°F | Resp 20 | Ht 67.0 in | Wt 258.0 lb

## 2013-04-29 DIAGNOSIS — R600 Localized edema: Secondary | ICD-10-CM
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
## 2013-05-02 ENCOUNTER — Ambulatory Visit: Payer: Medicare Other | Attending: Gynecologic Oncology | Admitting: Physical Therapy

## 2013-05-02 DIAGNOSIS — IMO0001 Reserved for inherently not codable concepts without codable children: Secondary | ICD-10-CM | POA: Insufficient documentation

## 2013-05-02 DIAGNOSIS — Z90710 Acquired absence of both cervix and uterus: Secondary | ICD-10-CM | POA: Diagnosis not present

## 2013-05-02 DIAGNOSIS — M79609 Pain in unspecified limb: Secondary | ICD-10-CM | POA: Diagnosis not present

## 2013-05-02 DIAGNOSIS — I890 Lymphedema, not elsewhere classified: Secondary | ICD-10-CM | POA: Insufficient documentation

## 2013-05-08 DIAGNOSIS — Z90710 Acquired absence of both cervix and uterus: Secondary | ICD-10-CM | POA: Diagnosis not present

## 2013-05-08 DIAGNOSIS — F339 Major depressive disorder, recurrent, unspecified: Secondary | ICD-10-CM | POA: Diagnosis not present

## 2013-05-08 DIAGNOSIS — Z789 Other specified health status: Secondary | ICD-10-CM | POA: Diagnosis not present

## 2013-05-09 DIAGNOSIS — Z23 Encounter for immunization: Secondary | ICD-10-CM | POA: Diagnosis not present

## 2013-05-19 ENCOUNTER — Ambulatory Visit: Payer: Medicare Other | Admitting: Physical Therapy

## 2013-05-20 ENCOUNTER — Ambulatory Visit: Payer: Medicare Other

## 2013-05-20 DIAGNOSIS — I890 Lymphedema, not elsewhere classified: Secondary | ICD-10-CM | POA: Diagnosis not present

## 2013-05-20 DIAGNOSIS — IMO0001 Reserved for inherently not codable concepts without codable children: Secondary | ICD-10-CM | POA: Diagnosis not present

## 2013-05-20 DIAGNOSIS — M79609 Pain in unspecified limb: Secondary | ICD-10-CM | POA: Diagnosis not present

## 2013-05-20 DIAGNOSIS — Z90710 Acquired absence of both cervix and uterus: Secondary | ICD-10-CM | POA: Diagnosis not present

## 2013-05-21 ENCOUNTER — Ambulatory Visit: Payer: Medicare Other | Admitting: Obstetrics & Gynecology

## 2013-05-22 ENCOUNTER — Encounter: Payer: Self-pay | Admitting: Gynecologic Oncology

## 2013-05-22 ENCOUNTER — Ambulatory Visit: Payer: Medicare Other | Attending: Gynecologic Oncology | Admitting: Gynecologic Oncology

## 2013-05-22 VITALS — BP 111/66 | HR 78 | Temp 98.2°F | Resp 16 | Ht 67.0 in | Wt 251.6 lb

## 2013-05-22 DIAGNOSIS — E039 Hypothyroidism, unspecified: Secondary | ICD-10-CM | POA: Diagnosis not present

## 2013-05-22 DIAGNOSIS — I890 Lymphedema, not elsewhere classified: Secondary | ICD-10-CM | POA: Insufficient documentation

## 2013-05-22 DIAGNOSIS — J449 Chronic obstructive pulmonary disease, unspecified: Secondary | ICD-10-CM | POA: Diagnosis not present

## 2013-05-22 DIAGNOSIS — Z79890 Hormone replacement therapy (postmenopausal): Secondary | ICD-10-CM | POA: Insufficient documentation

## 2013-05-22 DIAGNOSIS — Z90710 Acquired absence of both cervix and uterus: Secondary | ICD-10-CM | POA: Diagnosis not present

## 2013-05-22 DIAGNOSIS — C549 Malignant neoplasm of corpus uteri, unspecified: Secondary | ICD-10-CM | POA: Diagnosis not present

## 2013-05-22 DIAGNOSIS — C541 Malignant neoplasm of endometrium: Secondary | ICD-10-CM

## 2013-05-22 DIAGNOSIS — Z803 Family history of malignant neoplasm of breast: Secondary | ICD-10-CM | POA: Insufficient documentation

## 2013-05-22 DIAGNOSIS — I10 Essential (primary) hypertension: Secondary | ICD-10-CM | POA: Insufficient documentation

## 2013-05-22 DIAGNOSIS — Z9079 Acquired absence of other genital organ(s): Secondary | ICD-10-CM | POA: Insufficient documentation

## 2013-05-22 DIAGNOSIS — J4489 Other specified chronic obstructive pulmonary disease: Secondary | ICD-10-CM | POA: Insufficient documentation
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
## 2013-05-22 NOTE — Progress Notes (Signed)
Pt need follow up exam six months.  Hx of endometrial cancer.  will you please call and schedule and make sure she knows Dr Tresa Res retired. Thanks.

## 2013-05-23 ENCOUNTER — Ambulatory Visit: Payer: Medicare Other | Attending: Gynecologic Oncology

## 2013-05-23 DIAGNOSIS — Z90710 Acquired absence of both cervix and uterus: Secondary | ICD-10-CM | POA: Insufficient documentation

## 2013-05-23 DIAGNOSIS — I890 Lymphedema, not elsewhere classified: Secondary | ICD-10-CM | POA: Insufficient documentation

## 2013-05-23 DIAGNOSIS — M79609 Pain in unspecified limb: Secondary | ICD-10-CM | POA: Insufficient documentation

## 2013-05-23 DIAGNOSIS — IMO0001 Reserved for inherently not codable concepts without codable children: Secondary | ICD-10-CM | POA: Diagnosis not present

## 2013-05-26 ENCOUNTER — Ambulatory Visit: Payer: Medicare Other

## 2013-05-28 ENCOUNTER — Ambulatory Visit: Payer: Medicare Other | Admitting: Physical Therapy

## 2013-05-30 ENCOUNTER — Ambulatory Visit: Payer: Medicare Other

## 2013-06-02 ENCOUNTER — Ambulatory Visit: Payer: Medicare Other | Admitting: Physical Therapy

## 2013-06-04 ENCOUNTER — Ambulatory Visit: Payer: Medicare Other

## 2013-06-06 ENCOUNTER — Ambulatory Visit: Payer: Medicare Other

## 2013-06-09 ENCOUNTER — Ambulatory Visit: Payer: Medicare Other

## 2013-06-11 ENCOUNTER — Ambulatory Visit: Payer: Medicare Other | Admitting: Physical Therapy

## 2013-06-13 ENCOUNTER — Ambulatory Visit: Payer: Medicare Other | Admitting: Physical Therapy

## 2013-06-16 ENCOUNTER — Ambulatory Visit: Payer: Medicare Other

## 2013-06-18 ENCOUNTER — Ambulatory Visit: Payer: Medicare Other

## 2013-06-20 ENCOUNTER — Ambulatory Visit: Payer: Medicare Other | Admitting: Physical Therapy

## 2013-06-23 ENCOUNTER — Ambulatory Visit: Payer: Medicare Other | Attending: Gynecologic Oncology

## 2013-06-23 DIAGNOSIS — I890 Lymphedema, not elsewhere classified: Secondary | ICD-10-CM | POA: Insufficient documentation

## 2013-06-23 DIAGNOSIS — M79609 Pain in unspecified limb: Secondary | ICD-10-CM | POA: Insufficient documentation

## 2013-06-23 DIAGNOSIS — IMO0001 Reserved for inherently not codable concepts without codable children: Secondary | ICD-10-CM | POA: Insufficient documentation

## 2013-06-23 DIAGNOSIS — Z90710 Acquired absence of both cervix and uterus: Secondary | ICD-10-CM | POA: Diagnosis not present

## 2013-06-25 ENCOUNTER — Ambulatory Visit: Payer: Medicare Other

## 2013-06-25 DIAGNOSIS — Z90710 Acquired absence of both cervix and uterus: Secondary | ICD-10-CM | POA: Diagnosis not present

## 2013-06-25 DIAGNOSIS — M79609 Pain in unspecified limb: Secondary | ICD-10-CM | POA: Diagnosis not present

## 2013-06-25 DIAGNOSIS — I890 Lymphedema, not elsewhere classified: Secondary | ICD-10-CM | POA: Diagnosis not present

## 2013-06-25 DIAGNOSIS — IMO0001 Reserved for inherently not codable concepts without codable children: Secondary | ICD-10-CM | POA: Diagnosis not present

## 2013-06-26 ENCOUNTER — Other Ambulatory Visit: Payer: Self-pay

## 2013-06-27 ENCOUNTER — Ambulatory Visit: Payer: Medicare Other | Admitting: Physical Therapy

## 2013-06-27 DIAGNOSIS — M79609 Pain in unspecified limb: Secondary | ICD-10-CM | POA: Diagnosis not present

## 2013-06-27 DIAGNOSIS — I890 Lymphedema, not elsewhere classified: Secondary | ICD-10-CM | POA: Diagnosis not present

## 2013-06-27 DIAGNOSIS — IMO0001 Reserved for inherently not codable concepts without codable children: Secondary | ICD-10-CM | POA: Diagnosis not present

## 2013-06-27 DIAGNOSIS — Z90710 Acquired absence of both cervix and uterus: Secondary | ICD-10-CM | POA: Diagnosis not present

## 2013-06-30 ENCOUNTER — Encounter: Payer: Medicare Other | Admitting: Physical Therapy

## 2013-07-02 ENCOUNTER — Ambulatory Visit: Payer: Medicare Other | Admitting: Physical Therapy

## 2013-07-04 ENCOUNTER — Encounter: Payer: Medicare Other | Admitting: Physical Therapy

## 2013-07-09 ENCOUNTER — Encounter: Payer: Medicare Other | Admitting: Physical Therapy

## 2013-07-11 ENCOUNTER — Encounter: Payer: Medicare Other | Admitting: Physical Therapy

## 2013-07-16 ENCOUNTER — Encounter: Payer: Medicare Other | Admitting: Physical Therapy

## 2013-07-21 DIAGNOSIS — H16229 Keratoconjunctivitis sicca, not specified as Sjogren's, unspecified eye: Secondary | ICD-10-CM | POA: Diagnosis not present

## 2013-07-21 DIAGNOSIS — H43399 Other vitreous opacities, unspecified eye: Secondary | ICD-10-CM | POA: Diagnosis not present

## 2013-07-21 DIAGNOSIS — H2510 Age-related nuclear cataract, unspecified eye: Secondary | ICD-10-CM | POA: Diagnosis not present

## 2013-08-01 DIAGNOSIS — H16229 Keratoconjunctivitis sicca, not specified as Sjogren's, unspecified eye: Secondary | ICD-10-CM | POA: Diagnosis not present

## 2013-08-01 DIAGNOSIS — H04129 Dry eye syndrome of unspecified lacrimal gland: Secondary | ICD-10-CM | POA: Diagnosis not present

## 2013-08-01 DIAGNOSIS — H25019 Cortical age-related cataract, unspecified eye: Secondary | ICD-10-CM | POA: Diagnosis not present

## 2013-08-01 DIAGNOSIS — H2510 Age-related nuclear cataract, unspecified eye: Secondary | ICD-10-CM | POA: Diagnosis not present

## 2013-08-19 DIAGNOSIS — I10 Essential (primary) hypertension: Secondary | ICD-10-CM | POA: Diagnosis not present

## 2013-08-19 DIAGNOSIS — E0789 Other specified disorders of thyroid: Secondary | ICD-10-CM | POA: Diagnosis not present

## 2013-08-19 DIAGNOSIS — Z79899 Other long term (current) drug therapy: Secondary | ICD-10-CM | POA: Diagnosis not present

## 2013-08-19 DIAGNOSIS — E789 Disorder of lipoprotein metabolism, unspecified: Secondary | ICD-10-CM | POA: Diagnosis not present

## 2013-08-19 DIAGNOSIS — N390 Urinary tract infection, site not specified: Secondary | ICD-10-CM | POA: Diagnosis not present

## 2013-08-26 DIAGNOSIS — C549 Malignant neoplasm of corpus uteri, unspecified: Secondary | ICD-10-CM | POA: Diagnosis not present

## 2013-08-26 DIAGNOSIS — R0989 Other specified symptoms and signs involving the circulatory and respiratory systems: Secondary | ICD-10-CM | POA: Diagnosis not present

## 2013-08-26 DIAGNOSIS — M79609 Pain in unspecified limb: Secondary | ICD-10-CM | POA: Diagnosis not present

## 2013-08-26 DIAGNOSIS — Z23 Encounter for immunization: Secondary | ICD-10-CM | POA: Diagnosis not present

## 2013-08-26 DIAGNOSIS — E0789 Other specified disorders of thyroid: Secondary | ICD-10-CM | POA: Diagnosis not present

## 2013-08-26 DIAGNOSIS — R0609 Other forms of dyspnea: Secondary | ICD-10-CM | POA: Diagnosis not present

## 2013-09-04 DIAGNOSIS — R21 Rash and other nonspecific skin eruption: Secondary | ICD-10-CM | POA: Diagnosis not present

## 2013-10-30 DIAGNOSIS — F330 Major depressive disorder, recurrent, mild: Secondary | ICD-10-CM | POA: Insufficient documentation

## 2013-10-30 DIAGNOSIS — F339 Major depressive disorder, recurrent, unspecified: Secondary | ICD-10-CM | POA: Diagnosis not present

## 2013-10-30 DIAGNOSIS — E0789 Other specified disorders of thyroid: Secondary | ICD-10-CM | POA: Diagnosis not present

## 2013-11-14 DIAGNOSIS — R350 Frequency of micturition: Secondary | ICD-10-CM | POA: Diagnosis not present

## 2013-11-25 ENCOUNTER — Ambulatory Visit (INDEPENDENT_AMBULATORY_CARE_PROVIDER_SITE_OTHER): Payer: Medicare Other | Admitting: Gynecology

## 2013-11-25 ENCOUNTER — Encounter: Payer: Self-pay | Admitting: Gynecology

## 2013-11-25 VITALS — BP 104/74 | Resp 16 | Ht 67.0 in | Wt 168.0 lb

## 2013-11-25 DIAGNOSIS — Z1272 Encounter for screening for malignant neoplasm of vagina: Secondary | ICD-10-CM | POA: Diagnosis not present

## 2013-11-25 DIAGNOSIS — Z803 Family history of malignant neoplasm of breast: Secondary | ICD-10-CM | POA: Diagnosis not present

## 2013-11-25 DIAGNOSIS — Z86004 Personal history of in-situ neoplasm of other and unspecified digestive organs: Secondary | ICD-10-CM

## 2013-11-25 DIAGNOSIS — Z87898 Personal history of other specified conditions: Secondary | ICD-10-CM

## 2013-11-25 DIAGNOSIS — C549 Malignant neoplasm of corpus uteri, unspecified: Secondary | ICD-10-CM | POA: Diagnosis not present

## 2013-11-25 DIAGNOSIS — Z800 Family history of malignant neoplasm of digestive organs: Secondary | ICD-10-CM

## 2013-11-25 NOTE — Progress Notes (Signed)
Subjective:     Patient ID: Shelly Mosley, female   DOB: 01-Mar-1946, 68 y.o.   MRN: [id number]  HPI Comments: Pt here for 51mfloolw up after diagnosed with stage IA endometrial cancer, no residual cancer on hysterectomy sample.  Pt want to continue her estrogen for treatment of dry eyes and risk of blindness.  Pt has a strong family history of breast cancer as well as colon cancer.  Pt reports that sister was tested for BRCA and was negative but pt does not believe that sister has Lynch or BART    Review of Systems  Genitourinary: Positive for urgency and frequency.       Objective:   Physical Exam  Nursing note and vitals reviewed. Constitutional: She is oriented to person, place, and time. She appears well-developed and well-nourished.  Cardiovascular: Normal rate.   Pulmonary/Chest: Breath sounds normal.  Musculoskeletal: She exhibits edema (bilateral lower extremity).  Neurological: She is alert and oriented to person, place, and time.  Skin: Skin is warm and dry.   Pelvic: External genitalia:  no lesions              Urethra:  normal appearing urethra with no masses, tenderness or lesions              Bartholins and Skenes: normal                 Vagina: normal appearing vagina, supported              Cervix: absent              Pap taken: yes        Bimanual Exam:  Uterus:  Absent                                      Adnexa: no masses                                      Rectovaginal: no parametrial fullness                                      Anus:  normal sphincter tone, no lesions      Assessment:     Endometrial cancer s/p Robotic hysterectomy, BSO, LND,  Family history of cancer-breast, uterine, colon     Plan:     PAP done today Lymphedema noted by oncology,encouraged to wear support hose Pt reports being offered evista for breast cancer protection in past but was intolerant of it, feels very strongly regarding continuing her estrogen for her corenal health.  We  discussed alternative SERM's available and have suggested that she speak with oncology re possible  DUavee use, she agrees, info given. Pt never had genetic testing due to cost, and is interested in having done, since she started already with onc we will have her discuss with them, we also discussed surveillance of her breasts-she has had MRI done but it was years ago, we suggested she discuss with onc and consider 422mpent reviewing history and discussing familial cancer risks and survaillence, >50% face to face

## 2013-11-26 NOTE — Addendum Note (Signed)
Addended by: Elveria Rising on: 11/26/2013 07:43 PM   Modules accepted: Orders

## 2013-12-24 DIAGNOSIS — E0789 Other specified disorders of thyroid: Secondary | ICD-10-CM | POA: Diagnosis not present

## 2013-12-31 DIAGNOSIS — R0602 Shortness of breath: Secondary | ICD-10-CM | POA: Diagnosis not present

## 2013-12-31 DIAGNOSIS — R609 Edema, unspecified: Secondary | ICD-10-CM | POA: Diagnosis not present

## 2013-12-31 DIAGNOSIS — R0989 Other specified symptoms and signs involving the circulatory and respiratory systems: Secondary | ICD-10-CM | POA: Diagnosis not present

## 2013-12-31 DIAGNOSIS — R0609 Other forms of dyspnea: Secondary | ICD-10-CM | POA: Diagnosis not present

## 2013-12-31 DIAGNOSIS — E0789 Other specified disorders of thyroid: Secondary | ICD-10-CM | POA: Diagnosis not present

## 2014-01-21 LAB — IPS PAP SMEAR ONLY

## 2014-01-26 ENCOUNTER — Ambulatory Visit: Payer: Medicare Other | Admitting: Gynecology

## 2014-01-26 ENCOUNTER — Telehealth: Payer: Self-pay | Admitting: Gynecology

## 2014-01-26 ENCOUNTER — Ambulatory Visit: Payer: Medicare Other | Admitting: Nurse Practitioner

## 2014-01-26 NOTE — Telephone Encounter (Signed)
Pt came in for her appointment today and had to reschedule because she just had her 6 MO pap on 11/2013.Patient is rescheduled to 06/03/2044 with Dr. Charlies Constable. Pt states she will need refills for Vivelle dot. Pt will call her prescription into the pharmacy first

## 2014-01-29 DIAGNOSIS — R609 Edema, unspecified: Secondary | ICD-10-CM | POA: Diagnosis not present

## 2014-02-05 DIAGNOSIS — E0789 Other specified disorders of thyroid: Secondary | ICD-10-CM | POA: Diagnosis not present

## 2014-02-05 DIAGNOSIS — R609 Edema, unspecified: Secondary | ICD-10-CM | POA: Diagnosis not present

## 2014-02-05 DIAGNOSIS — R0989 Other specified symptoms and signs involving the circulatory and respiratory systems: Secondary | ICD-10-CM | POA: Diagnosis not present

## 2014-02-05 DIAGNOSIS — R0609 Other forms of dyspnea: Secondary | ICD-10-CM | POA: Diagnosis not present

## 2014-02-24 ENCOUNTER — Ambulatory Visit (INDEPENDENT_AMBULATORY_CARE_PROVIDER_SITE_OTHER): Payer: Medicare Other | Admitting: Pulmonary Disease

## 2014-02-24 ENCOUNTER — Encounter: Payer: Self-pay | Admitting: Pulmonary Disease

## 2014-02-24 VITALS — BP 142/84 | HR 73 | Ht 66.5 in | Wt 277.0 lb

## 2014-02-24 DIAGNOSIS — R0609 Other forms of dyspnea: Secondary | ICD-10-CM

## 2014-02-24 DIAGNOSIS — R0989 Other specified symptoms and signs involving the circulatory and respiratory systems: Secondary | ICD-10-CM | POA: Diagnosis not present

## 2014-02-24 DIAGNOSIS — J411 Mucopurulent chronic bronchitis: Secondary | ICD-10-CM | POA: Diagnosis not present

## 2014-02-24 DIAGNOSIS — W19XXXA Unspecified fall, initial encounter: Secondary | ICD-10-CM | POA: Diagnosis not present

## 2014-02-24 DIAGNOSIS — J449 Chronic obstructive pulmonary disease, unspecified: Secondary | ICD-10-CM

## 2014-02-24 DIAGNOSIS — R0602 Shortness of breath: Secondary | ICD-10-CM

## 2014-02-24 DIAGNOSIS — R0600 Dyspnea, unspecified: Secondary | ICD-10-CM | POA: Insufficient documentation

## 2014-02-24 HISTORY — DX: Chronic obstructive pulmonary disease, unspecified: J44.9

## 2014-02-24 MED ORDER — ALBUTEROL SULFATE HFA 108 (90 BASE) MCG/ACT IN AERS: 2.0000 | INHALATION_SPRAY | Freq: Three times a day (TID) | RESPIRATORY_TRACT | Status: DC

## 2014-02-24 NOTE — Patient Instructions (Signed)
Exercise regularly and lose weight  You do have COPD  I want you to try taking the albuterol 2 puffs three times a day no matter how you feel to help with the shortness of breath  Also, try taking the Flovent once a day every other day to see if that helps with the shortness of breath  We will see you back in 2 months or sooner if needed

## 2014-02-24 NOTE — Assessment & Plan Note (Signed)
This is due to her obesity and her COPD.  She really needs to diet, exercise regularly, and lose weight.  That will do her more good than any inhaler given her 50+ pound weight gain in the last year.  I'm happy that a cardiac evaluation in 2008 was normal.  If she doesn't improve with weight loss or if her symptoms escalate she may need another cardiac work up.  Plan: -lose weight -diet/exercise -see COPD -consider cardiac work up if no improvement
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
## 2014-02-24 NOTE — Assessment & Plan Note (Addendum)
COPD: GOLD GRADE B Combined recommendations from the [employer], [employer], Investment banker, corporate, [place] (Qaseem A et al, Ann Intern Med. 2011;155(3):179) recommends tobacco cessation, pulmonary rehab (for symptomatic patients with an FEV1 < 50% predicted), supplemental oxygen (for patients with SaO2 <88% or paO2 <55), and appropriate bronchodilator therapy.  In regards to long acting bronchodilators, they recommend monotherapy (FEV1 60-80% with symptoms weak evidence, FEV1 with symptoms <60% strong evidence), or combination therapy (FEV1 <60% with symptoms, strong recommendation, moderate evidence).  One should also provide patients with annual immunizations and consider therapy for prevention of COPD exacerbations (ie. roflumilast or azithromycin) when appopriate.  -O2 therapy: not indicated -Immunizations: UTD, advised flu shot in fall -Tobacco use: quit 2014 -Exercise: advised regular exercise -Bronchodilator therapy: this is our biggest challenge here given her dry eyes related to her thyroid condition.  I explained to her that I think she should cut the dose of flovent by 75% (one puff every other day) and try to use albuterol on a scheduled basis as she doesn't think that this has made the problem worse in the past -Exacerbation prevention: n/a

## 2014-02-25 DIAGNOSIS — W19XXXA Unspecified fall, initial encounter: Secondary | ICD-10-CM | POA: Insufficient documentation

## 2014-02-25 NOTE — Assessment & Plan Note (Signed)
Apparently she fell coming into the office today. Someone helped her break her fall. She tripped.  On exam there were no abnormalities.  She is embarrassed but she feels fine.

## 2014-03-04 DIAGNOSIS — Z803 Family history of malignant neoplasm of breast: Secondary | ICD-10-CM | POA: Diagnosis not present

## 2014-03-04 DIAGNOSIS — Z1231 Encounter for screening mammogram for malignant neoplasm of breast: Secondary | ICD-10-CM | POA: Diagnosis not present

## 2014-03-09 ENCOUNTER — Telehealth: Payer: Self-pay | Admitting: Pulmonary Disease

## 2014-03-09 NOTE — Telephone Encounter (Signed)
All records re-faxed to [phone] per Ashtyn's message ; this is the fax number everything was originally faxed to.  This is also the fax number listed on the [place]

## 2014-03-09 NOTE — Telephone Encounter (Signed)
Please advise Shelly Mosley, [facility] in [place] stating that they did not receive orders faxed.  States they went to a wrong location. Number given below is the same as the order in the referral note.  Can you please refax everything needed to ensure that they receive it. Correct fax to pulm rehab in [facility] @ fax # [phone]  Thanks.

## 2014-03-09 NOTE — Telephone Encounter (Signed)
Spoke with Jasmyn @[place]  [facility] & she verified that they have rec'd this pt's Pulm Rehab Referral Kara Mead

## 2014-03-11 ENCOUNTER — Other Ambulatory Visit: Payer: Self-pay | Admitting: *Deleted

## 2014-03-11 NOTE — Telephone Encounter (Signed)
Incoming fax requesting Estradiol 0.05 mg Patch   Last AEX and refill 01/20/13 #24/3 refill Next AEX 06/03/14  MMG 02/2013 BIRADS0. Left Breast US 02/2013 BIRADS2.  Please approve or deny Rx.

## 2014-03-13 ENCOUNTER — Other Ambulatory Visit: Payer: Self-pay

## 2014-03-13 NOTE — Telephone Encounter (Signed)
Entered in error. Disregard.

## 2014-03-16 MED ORDER — ESTRADIOL 0.05 MG/24HR TD PTTW: 1.0000 | MEDICATED_PATCH | TRANSDERMAL | Status: DC

## 2014-03-16 NOTE — Telephone Encounter (Signed)
Patient is calling about the refill

## 2014-04-02 DIAGNOSIS — R82998 Other abnormal findings in urine: Secondary | ICD-10-CM | POA: Diagnosis not present

## 2014-04-02 DIAGNOSIS — E789 Disorder of lipoprotein metabolism, unspecified: Secondary | ICD-10-CM | POA: Diagnosis not present

## 2014-04-08 DIAGNOSIS — E559 Vitamin D deficiency, unspecified: Secondary | ICD-10-CM | POA: Diagnosis not present

## 2014-04-08 DIAGNOSIS — R609 Edema, unspecified: Secondary | ICD-10-CM | POA: Diagnosis not present

## 2014-04-08 DIAGNOSIS — N390 Urinary tract infection, site not specified: Secondary | ICD-10-CM | POA: Diagnosis not present

## 2014-04-08 DIAGNOSIS — R0989 Other specified symptoms and signs involving the circulatory and respiratory systems: Secondary | ICD-10-CM | POA: Diagnosis not present

## 2014-04-08 DIAGNOSIS — R0609 Other forms of dyspnea: Secondary | ICD-10-CM | POA: Diagnosis not present

## 2014-04-08 DIAGNOSIS — E789 Disorder of lipoprotein metabolism, unspecified: Secondary | ICD-10-CM | POA: Diagnosis not present

## 2014-04-23 DIAGNOSIS — F451 Undifferentiated somatoform disorder: Secondary | ICD-10-CM | POA: Diagnosis not present

## 2014-04-23 DIAGNOSIS — F330 Major depressive disorder, recurrent, mild: Secondary | ICD-10-CM | POA: Diagnosis not present

## 2014-04-23 DIAGNOSIS — F341 Dysthymic disorder: Secondary | ICD-10-CM | POA: Diagnosis not present

## 2014-05-08 ENCOUNTER — Ambulatory Visit: Payer: Medicare Other | Admitting: Pulmonary Disease

## 2014-05-19 ENCOUNTER — Telehealth: Payer: Self-pay | Admitting: Gynecologic Oncology
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
## 2014-05-20 ENCOUNTER — Ambulatory Visit: Payer: Medicare Other | Attending: Gynecologic Oncology | Admitting: Gynecologic Oncology

## 2014-05-20 VITALS — BP 112/71 | HR 64 | Temp 97.8°F | Resp 18 | Ht 67.0 in | Wt 272.7 lb

## 2014-05-20 DIAGNOSIS — Z800 Family history of malignant neoplasm of digestive organs: Secondary | ICD-10-CM | POA: Diagnosis not present

## 2014-05-20 DIAGNOSIS — J449 Chronic obstructive pulmonary disease, unspecified: Secondary | ICD-10-CM | POA: Insufficient documentation

## 2014-05-20 DIAGNOSIS — Z9079 Acquired absence of other genital organ(s): Secondary | ICD-10-CM | POA: Insufficient documentation

## 2014-05-20 DIAGNOSIS — K219 Gastro-esophageal reflux disease without esophagitis: Secondary | ICD-10-CM | POA: Diagnosis not present

## 2014-05-20 DIAGNOSIS — G8929 Other chronic pain: Secondary | ICD-10-CM | POA: Diagnosis not present

## 2014-05-20 DIAGNOSIS — Z803 Family history of malignant neoplasm of breast: Secondary | ICD-10-CM | POA: Insufficient documentation

## 2014-05-20 DIAGNOSIS — E669 Obesity, unspecified: Secondary | ICD-10-CM | POA: Insufficient documentation

## 2014-05-20 DIAGNOSIS — I890 Lymphedema, not elsewhere classified: Secondary | ICD-10-CM

## 2014-05-20 DIAGNOSIS — C549 Malignant neoplasm of corpus uteri, unspecified: Secondary | ICD-10-CM | POA: Diagnosis not present

## 2014-05-20 DIAGNOSIS — C541 Malignant neoplasm of endometrium: Secondary | ICD-10-CM

## 2014-05-20 DIAGNOSIS — E039 Hypothyroidism, unspecified: Secondary | ICD-10-CM | POA: Insufficient documentation

## 2014-05-20 DIAGNOSIS — F17200 Nicotine dependence, unspecified, uncomplicated: Secondary | ICD-10-CM | POA: Insufficient documentation

## 2014-05-20 DIAGNOSIS — J4489 Other specified chronic obstructive pulmonary disease: Secondary | ICD-10-CM | POA: Diagnosis not present

## 2014-05-20 DIAGNOSIS — I10 Essential (primary) hypertension: Secondary | ICD-10-CM | POA: Diagnosis not present

## 2014-05-20 DIAGNOSIS — M549 Dorsalgia, unspecified: Secondary | ICD-10-CM | POA: Insufficient documentation

## 2014-05-20 DIAGNOSIS — Z90710 Acquired absence of both cervix and uterus: Secondary | ICD-10-CM | POA: Diagnosis not present

## 2014-05-20 NOTE — Patient Instructions (Signed)
F/U with Gyn Onc in one year Followup with Dr. Joan Flores  and [name] in 6 months.   Body mass index is 42.7 kg/(m^2).    FAST FACTS   Body Mass Index (BMI) is one measurement that your doctor may use to discuss your weight   BMI is an estimate of body fat. Individuals with a BMI of 25.0-29.9 are considered overweight. Those with a BMI above 30.0 are considered obese   Obesity is a risk factor for many cancers, especially endometrial cancer. In fact, if you are obese, your risk for endometrial cancer may be 10 times higher.   Obesity may affect how your cancer is treated (surgery, chemotherapy, and/or radiation).   If you are overweight or obese, ask your doctor for information about diet and exercise programs.  EXERCISE Here is a list of resources for exercise recommendations and programs that can help you get started. Be sure to look for exercise programs and classes in your neighborhood to get personal support.  American Cancer Society (ACS): Eat Healthy and Get Active www.cancer.org/healthy/eathealthygetactive/ The site provides details about the importance of exercise in cancer prevention as well as resources providing exercise guidelines and tools to set goals and manage physical activity  American Council on Exercise (ACE): Get Fit www.acefitness.org/acefit  This site is full of fitness programs including personalized training workouts and a Art therapist of exercise programs. Links to local exercise trainers are provided.  American Heart Association: Getting Healthy - Physical Activity https://richard.com/ This site provides the American Heart Association guidelines for physical activity, tips for getting started and tips for long term success.    Centers for Disease Control and Prevention: Physical Activity JacksonvilleDryCleaner.si This site offers physical  activity guidelines for all Americans including interval and type of exercise. Measures for physical activity intensity are provided, as well as success stories to inspire you.  National Association for [employer].physicalfitness.org/ This is a Environmental education officer that exists to improve the quality of life for indivduals through promotion of physical fitness, sports, and healthy lifestyles. This site has links to information on fitness, physical activity, and general well-being.  World Health Organization: Health Topics - Physical Activity CardChaser.es This site provides clear definitions of physical activity and global recommendations on physical activity for health.    DIET The following websites are designed to help you lose weight and eat healthy. Additional resources may be available through your cancer center, clinic, or hospital.  Caring 4 Cancer www.caring4cancer.com/ This site provides education materials and videos regarding diet and nutrition in cancer.  Livestrong https://www.carr.net/ This site provides both education and food journaling for cancer survivors. There are also videos and blogs including recipes and motivational materials.   Choose My Plate www.ArtistMovie.se The site "BuildDNA.es" is a great site for tracking diet and obtaining healthy recipes. Patients can also obtain daily food plans and worksheets. "Supertracker" can help you plan and track your meals.  [employer] for Cancer Research MobLag.com.cy The American Institute for [employer] has a program called the "New American Plate Challenge". This program is 12 weeks long and provides instruction regarding healthy eating that specifically includes Food that Fight CancerTM.  You can track your progress and exchange messages with other members.  This information is brought to you by the Society of Gynecologic Oncology. The Society of Gynecologic  Oncology is an 1,800-member organization of medical specialists dedicated to the eradication of gynecologic cancers. Our members include primarily gynecologic oncologists, as well as medical oncologists, pathologists, radiation oncologists,  hematologists, surgical oncologsits, obstetrician/gynecologists, nurses, physician assistants, and other allied health professionals interested in the treatment and care of women's cancer.   For more information go to:  [email or web address]/OBESITY
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
## 2014-06-03 ENCOUNTER — Ambulatory Visit: Payer: Medicare Other | Admitting: Gynecology

## 2014-06-04 ENCOUNTER — Ambulatory Visit (INDEPENDENT_AMBULATORY_CARE_PROVIDER_SITE_OTHER): Payer: Medicare Other | Admitting: Pulmonary Disease

## 2014-06-04 ENCOUNTER — Encounter: Payer: Self-pay | Admitting: Pulmonary Disease

## 2014-06-04 VITALS — BP 132/74 | HR 65 | Ht 66.5 in | Wt 276.0 lb

## 2014-06-04 DIAGNOSIS — J411 Mucopurulent chronic bronchitis: Secondary | ICD-10-CM

## 2014-06-04 DIAGNOSIS — Z23 Encounter for immunization: Secondary | ICD-10-CM | POA: Diagnosis not present

## 2014-06-04 MED ORDER — IPRATROPIUM-ALBUTEROL 20-100 MCG/ACT IN AERS: 1.0000 | INHALATION_SPRAY | Freq: Four times a day (QID) | RESPIRATORY_TRACT | Status: DC

## 2014-06-04 NOTE — Patient Instructions (Signed)
Take the Combivent 3-4 times per day Stop taking QVar We will see you back in 6 months or sooner if needed

## 2014-06-04 NOTE — Assessment & Plan Note (Signed)
She has mild to moderate airflow obstruction. She has not had lung volumes but in the setting of her obesity I suspect there is at least mild restrictive lung disease which would make quantification of her obstruction difficult. I explained to her today that her shortness of breath is multifactorial and while COPD contributes I feel that her obesity and deconditioning are the more significant factors.  She continues to struggle with dry eyes related to the inhaled steroid. Considering the mild nature of her airflow obstruction I am going to have her tried Combivent alone and we'll stop the inhaled steroid.  Plan: -Combivent 4 times a day -Stop Flovent -Flu shot today Advised diet and weight loss -Followup 6 months
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
## 2014-06-22 ENCOUNTER — Encounter: Payer: Self-pay | Admitting: Pulmonary Disease

## 2014-07-01 ENCOUNTER — Ambulatory Visit: Payer: Medicare Other | Admitting: Gynecology

## 2014-07-01 ENCOUNTER — Other Ambulatory Visit: Payer: Self-pay | Admitting: Gynecology

## 2014-07-01 NOTE — Telephone Encounter (Signed)
Last refill 03/16/14 #8patch/3Refills Next appt 07/13/14 [facility]: 02/2014 BIRADS1: neg Hx of endometrial cancer  Please advise

## 2014-07-01 NOTE — Telephone Encounter (Signed)
I recommend a visit in our office to for a pap and to have a discussion about estrogen therapy for her.  I am not clear about her eye condition and the treatment for it.  The estrogen could be a risk factor for retinal thrombosis which could cause loss of vision. Is there another option that she can seek for hydration of her eyes from her opthamologist?

## 2014-07-01 NOTE — Telephone Encounter (Signed)
If she is to be followed every 6 months for a pap with her history of endometrial cancer.  Her last pap here was in April 2015 so she is past due.  The other concerns that I have is her continued use of Vivelle Dot with her PMH.  I do undersatnd her concerns about her eyes and dryness.  I will send this note to Dr. Quincy Simmonds and have to look at as well.

## 2014-07-02 NOTE — Telephone Encounter (Signed)
This appointment needs to be with one of MD's since there has to be a decision about ERT

## 2014-07-02 NOTE — Telephone Encounter (Signed)
See note from Dr. Quincy Simmonds.  Patient needs to come in for a repeat pap and discussion about ERT.  Have her make appointment with one of MD's

## 2014-07-02 NOTE — Telephone Encounter (Signed)
Next appt 07/13/14 with Ms Chong Sicilian. FYI

## 2014-07-07 NOTE — Telephone Encounter (Signed)
Spoke with patient. Appointment rescheduled to 12/11 at 10am with Dr.Silva. Patient is agreeable to date and time. Advised patient per Milford Cage, FNP okay for refill on medication until she can be seen for annual. Milford Cage, FNP order has been placed but is pending your approval for one month until appointment. Please sign and send to pharmacy if agreeable.

## 2014-07-13 ENCOUNTER — Ambulatory Visit: Payer: Medicare Other | Admitting: Nurse Practitioner

## 2014-07-27 DIAGNOSIS — H16223 Keratoconjunctivitis sicca, not specified as Sjogren's, bilateral: Secondary | ICD-10-CM | POA: Diagnosis not present

## 2014-07-27 DIAGNOSIS — H2513 Age-related nuclear cataract, bilateral: Secondary | ICD-10-CM | POA: Diagnosis not present

## 2014-07-31 ENCOUNTER — Ambulatory Visit (INDEPENDENT_AMBULATORY_CARE_PROVIDER_SITE_OTHER): Payer: Medicare Other | Admitting: Obstetrics and Gynecology

## 2014-07-31 ENCOUNTER — Encounter: Payer: Self-pay | Admitting: Obstetrics and Gynecology

## 2014-07-31 VITALS — BP 124/70 | HR 68 | Resp 18 | Ht 66.5 in | Wt 265.8 lb

## 2014-07-31 DIAGNOSIS — B372 Candidiasis of skin and nail: Secondary | ICD-10-CM | POA: Diagnosis not present

## 2014-07-31 DIAGNOSIS — C541 Malignant neoplasm of endometrium: Secondary | ICD-10-CM | POA: Diagnosis not present

## 2014-07-31 DIAGNOSIS — Z01419 Encounter for gynecological examination (general) (routine) without abnormal findings: Secondary | ICD-10-CM | POA: Diagnosis not present

## 2014-07-31 DIAGNOSIS — Z124 Encounter for screening for malignant neoplasm of cervix: Secondary | ICD-10-CM | POA: Diagnosis not present

## 2014-07-31 DIAGNOSIS — Z1272 Encounter for screening for malignant neoplasm of vagina: Secondary | ICD-10-CM | POA: Diagnosis not present

## 2014-07-31 MED ORDER — FLUCONAZOLE 150 MG PO TABS: 150.0000 mg | ORAL_TABLET | Freq: Once | ORAL | Status: DC

## 2014-07-31 MED ORDER — NYSTATIN 100000 UNIT/GM EX POWD: Freq: Three times a day (TID) | CUTANEOUS | Status: DC

## 2014-07-31 MED ORDER — ESTRADIOL 0.05 MG/24HR TD PTTW: MEDICATED_PATCH | TRANSDERMAL | Status: DC
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
## 2014-07-31 NOTE — Patient Instructions (Signed)

## 2014-08-04 LAB — IPS PAP SMEAR ONLY

## 2014-08-20 DIAGNOSIS — I10 Essential (primary) hypertension: Secondary | ICD-10-CM | POA: Diagnosis not present

## 2014-08-20 DIAGNOSIS — E0789 Other specified disorders of thyroid: Secondary | ICD-10-CM | POA: Diagnosis not present

## 2014-08-20 DIAGNOSIS — E789 Disorder of lipoprotein metabolism, unspecified: Secondary | ICD-10-CM | POA: Diagnosis not present

## 2014-08-20 DIAGNOSIS — Z79899 Other long term (current) drug therapy: Secondary | ICD-10-CM | POA: Diagnosis not present

## 2014-08-20 DIAGNOSIS — N390 Urinary tract infection, site not specified: Secondary | ICD-10-CM | POA: Diagnosis not present

## 2014-08-21 DIAGNOSIS — IMO0002 Reserved for concepts with insufficient information to code with codable children: Secondary | ICD-10-CM

## 2014-08-21 DIAGNOSIS — N6092 Unspecified benign mammary dysplasia of left breast: Secondary | ICD-10-CM

## 2014-08-21 HISTORY — DX: Reserved for concepts with insufficient information to code with codable children: IMO0002

## 2014-08-21 HISTORY — DX: Unspecified benign mammary dysplasia of left breast: N60.92

## 2014-08-27 DIAGNOSIS — R609 Edema, unspecified: Secondary | ICD-10-CM | POA: Diagnosis not present

## 2014-08-27 DIAGNOSIS — R0689 Other abnormalities of breathing: Secondary | ICD-10-CM | POA: Diagnosis not present

## 2014-08-27 DIAGNOSIS — I10 Essential (primary) hypertension: Secondary | ICD-10-CM | POA: Diagnosis not present

## 2014-08-27 DIAGNOSIS — J200 Acute bronchitis due to Mycoplasma pneumoniae: Secondary | ICD-10-CM | POA: Diagnosis not present

## 2014-09-03 DIAGNOSIS — R918 Other nonspecific abnormal finding of lung field: Secondary | ICD-10-CM | POA: Diagnosis not present

## 2014-09-03 DIAGNOSIS — M810 Age-related osteoporosis without current pathological fracture: Secondary | ICD-10-CM | POA: Diagnosis not present

## 2014-09-03 DIAGNOSIS — E559 Vitamin D deficiency, unspecified: Secondary | ICD-10-CM | POA: Diagnosis not present

## 2014-09-03 DIAGNOSIS — C541 Malignant neoplasm of endometrium: Secondary | ICD-10-CM | POA: Diagnosis not present

## 2014-09-14 DIAGNOSIS — R140 Abdominal distension (gaseous): Secondary | ICD-10-CM | POA: Diagnosis not present

## 2014-09-14 DIAGNOSIS — K580 Irritable bowel syndrome with diarrhea: Secondary | ICD-10-CM | POA: Diagnosis not present

## 2014-09-14 DIAGNOSIS — R159 Full incontinence of feces: Secondary | ICD-10-CM | POA: Diagnosis not present

## 2014-09-14 DIAGNOSIS — R152 Fecal urgency: Secondary | ICD-10-CM | POA: Diagnosis not present

## 2014-09-14 DIAGNOSIS — K921 Melena: Secondary | ICD-10-CM | POA: Diagnosis not present

## 2014-09-14 DIAGNOSIS — K591 Functional diarrhea: Secondary | ICD-10-CM | POA: Diagnosis not present

## 2014-09-14 DIAGNOSIS — R1900 Intra-abdominal and pelvic swelling, mass and lump, unspecified site: Secondary | ICD-10-CM | POA: Diagnosis not present

## 2014-09-14 DIAGNOSIS — R1030 Lower abdominal pain, unspecified: Secondary | ICD-10-CM | POA: Diagnosis not present

## 2014-09-14 DIAGNOSIS — R143 Flatulence: Secondary | ICD-10-CM | POA: Diagnosis not present

## 2014-09-14 DIAGNOSIS — R194 Change in bowel habit: Secondary | ICD-10-CM | POA: Diagnosis not present

## 2014-09-14 DIAGNOSIS — R12 Heartburn: Secondary | ICD-10-CM | POA: Diagnosis not present

## 2014-09-14 DIAGNOSIS — R197 Diarrhea, unspecified: Secondary | ICD-10-CM | POA: Diagnosis not present

## 2014-09-15 DIAGNOSIS — K921 Melena: Secondary | ICD-10-CM | POA: Diagnosis not present

## 2014-09-15 DIAGNOSIS — R194 Change in bowel habit: Secondary | ICD-10-CM | POA: Diagnosis not present

## 2014-09-15 DIAGNOSIS — R197 Diarrhea, unspecified: Secondary | ICD-10-CM | POA: Diagnosis not present

## 2014-09-25 DIAGNOSIS — Z1211 Encounter for screening for malignant neoplasm of colon: Secondary | ICD-10-CM | POA: Diagnosis not present

## 2014-09-25 DIAGNOSIS — Z9049 Acquired absence of other specified parts of digestive tract: Secondary | ICD-10-CM | POA: Diagnosis not present

## 2014-09-25 DIAGNOSIS — R194 Change in bowel habit: Secondary | ICD-10-CM | POA: Diagnosis not present

## 2014-09-25 DIAGNOSIS — K2950 Unspecified chronic gastritis without bleeding: Secondary | ICD-10-CM | POA: Diagnosis not present

## 2014-09-25 DIAGNOSIS — K319 Disease of stomach and duodenum, unspecified: Secondary | ICD-10-CM | POA: Diagnosis not present

## 2014-09-25 DIAGNOSIS — K648 Other hemorrhoids: Secondary | ICD-10-CM | POA: Diagnosis not present

## 2014-09-25 DIAGNOSIS — K5730 Diverticulosis of large intestine without perforation or abscess without bleeding: Secondary | ICD-10-CM | POA: Diagnosis not present

## 2014-09-25 DIAGNOSIS — R12 Heartburn: Secondary | ICD-10-CM | POA: Diagnosis not present

## 2014-09-25 DIAGNOSIS — D126 Benign neoplasm of colon, unspecified: Secondary | ICD-10-CM | POA: Diagnosis not present

## 2014-09-25 DIAGNOSIS — D123 Benign neoplasm of transverse colon: Secondary | ICD-10-CM | POA: Diagnosis not present

## 2014-10-01 DIAGNOSIS — R1900 Intra-abdominal and pelvic swelling, mass and lump, unspecified site: Secondary | ICD-10-CM | POA: Diagnosis not present

## 2014-10-06 DIAGNOSIS — R1900 Intra-abdominal and pelvic swelling, mass and lump, unspecified site: Secondary | ICD-10-CM | POA: Diagnosis not present

## 2014-10-06 DIAGNOSIS — R1030 Lower abdominal pain, unspecified: Secondary | ICD-10-CM | POA: Diagnosis not present

## 2014-10-06 DIAGNOSIS — K6389 Other specified diseases of intestine: Secondary | ICD-10-CM | POA: Diagnosis not present

## 2014-10-06 DIAGNOSIS — K5900 Constipation, unspecified: Secondary | ICD-10-CM | POA: Diagnosis not present

## 2014-10-14 ENCOUNTER — Encounter: Payer: Self-pay | Admitting: Gynecologic Oncology

## 2014-10-14 ENCOUNTER — Ambulatory Visit: Payer: Medicare Other | Attending: Gynecologic Oncology | Admitting: Gynecologic Oncology

## 2014-10-14 VITALS — BP 105/56 | HR 62 | Temp 97.8°F | Resp 16 | Ht 66.5 in | Wt 269.5 lb

## 2014-10-14 DIAGNOSIS — C541 Malignant neoplasm of endometrium: Secondary | ICD-10-CM | POA: Insufficient documentation

## 2014-10-14 DIAGNOSIS — I890 Lymphedema, not elsewhere classified: Secondary | ICD-10-CM | POA: Diagnosis not present

## 2014-10-14 DIAGNOSIS — Z8542 Personal history of malignant neoplasm of other parts of uterus: Secondary | ICD-10-CM | POA: Diagnosis not present
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
## 2014-10-14 NOTE — Patient Instructions (Signed)
Followup with Dr. Denman George in 6 months with a CT scan prior to that visit. Call in May to schedule that appt and CT scan.

## 2014-10-22 DIAGNOSIS — F330 Major depressive disorder, recurrent, mild: Secondary | ICD-10-CM | POA: Diagnosis not present

## 2014-10-27 DIAGNOSIS — H6500 Acute serous otitis media, unspecified ear: Secondary | ICD-10-CM | POA: Diagnosis not present

## 2014-10-27 DIAGNOSIS — E039 Hypothyroidism, unspecified: Secondary | ICD-10-CM | POA: Diagnosis not present

## 2014-10-27 DIAGNOSIS — I10 Essential (primary) hypertension: Secondary | ICD-10-CM | POA: Diagnosis not present

## 2014-11-05 DIAGNOSIS — R600 Localized edema: Secondary | ICD-10-CM | POA: Diagnosis not present

## 2014-11-05 DIAGNOSIS — I10 Essential (primary) hypertension: Secondary | ICD-10-CM | POA: Diagnosis not present

## 2014-11-05 DIAGNOSIS — R079 Chest pain, unspecified: Secondary | ICD-10-CM | POA: Diagnosis not present

## 2014-11-05 DIAGNOSIS — R0602 Shortness of breath: Secondary | ICD-10-CM | POA: Diagnosis not present

## 2014-11-24 DIAGNOSIS — R079 Chest pain, unspecified: Secondary | ICD-10-CM | POA: Diagnosis not present

## 2014-11-24 DIAGNOSIS — R609 Edema, unspecified: Secondary | ICD-10-CM | POA: Diagnosis not present

## 2014-12-02 ENCOUNTER — Inpatient Hospital Stay (HOSPITAL_COMMUNITY)
Admission: EM | Admit: 2014-12-02 | Discharge: 2014-12-07 | DRG: 871 | Disposition: A | Discharge status: Home / Self Care | Payer: Medicare Other | Attending: Internal Medicine | Admitting: Internal Medicine

## 2014-12-02 ENCOUNTER — Emergency Department (HOSPITAL_COMMUNITY): Payer: Medicare Other

## 2014-12-02 ENCOUNTER — Encounter (HOSPITAL_COMMUNITY): Payer: Self-pay

## 2014-12-02 DIAGNOSIS — J441 Chronic obstructive pulmonary disease with (acute) exacerbation: Secondary | ICD-10-CM | POA: Diagnosis present

## 2014-12-02 DIAGNOSIS — I10 Essential (primary) hypertension: Secondary | ICD-10-CM | POA: Diagnosis present

## 2014-12-02 DIAGNOSIS — D509 Iron deficiency anemia, unspecified: Secondary | ICD-10-CM | POA: Diagnosis present

## 2014-12-02 DIAGNOSIS — J181 Lobar pneumonia, unspecified organism: Secondary | ICD-10-CM | POA: Diagnosis present

## 2014-12-02 DIAGNOSIS — Z7982 Long term (current) use of aspirin: Secondary | ICD-10-CM

## 2014-12-02 DIAGNOSIS — Z833 Family history of diabetes mellitus: Secondary | ICD-10-CM | POA: Diagnosis not present

## 2014-12-02 DIAGNOSIS — J9601 Acute respiratory failure with hypoxia: Secondary | ICD-10-CM | POA: Diagnosis present

## 2014-12-02 DIAGNOSIS — J189 Pneumonia, unspecified organism: Secondary | ICD-10-CM | POA: Insufficient documentation

## 2014-12-02 DIAGNOSIS — Z79899 Other long term (current) drug therapy: Secondary | ICD-10-CM | POA: Diagnosis not present

## 2014-12-02 DIAGNOSIS — R0602 Shortness of breath: Secondary | ICD-10-CM | POA: Diagnosis not present

## 2014-12-02 DIAGNOSIS — F419 Anxiety disorder, unspecified: Secondary | ICD-10-CM | POA: Diagnosis present

## 2014-12-02 DIAGNOSIS — D638 Anemia in other chronic diseases classified elsewhere: Secondary | ICD-10-CM | POA: Diagnosis present

## 2014-12-02 DIAGNOSIS — Z803 Family history of malignant neoplasm of breast: Secondary | ICD-10-CM

## 2014-12-02 DIAGNOSIS — Z6841 Body Mass Index (BMI) 40.0 and over, adult: Secondary | ICD-10-CM

## 2014-12-02 DIAGNOSIS — E876 Hypokalemia: Secondary | ICD-10-CM | POA: Diagnosis present

## 2014-12-02 DIAGNOSIS — Z7951 Long term (current) use of inhaled steroids: Secondary | ICD-10-CM | POA: Diagnosis not present

## 2014-12-02 DIAGNOSIS — E785 Hyperlipidemia, unspecified: Secondary | ICD-10-CM | POA: Diagnosis present

## 2014-12-02 DIAGNOSIS — K589 Irritable bowel syndrome without diarrhea: Secondary | ICD-10-CM | POA: Diagnosis present

## 2014-12-02 DIAGNOSIS — M797 Fibromyalgia: Secondary | ICD-10-CM | POA: Diagnosis present

## 2014-12-02 DIAGNOSIS — Z87891 Personal history of nicotine dependence: Secondary | ICD-10-CM | POA: Diagnosis not present

## 2014-12-02 DIAGNOSIS — A419 Sepsis, unspecified organism: Secondary | ICD-10-CM | POA: Diagnosis not present

## 2014-12-02 DIAGNOSIS — Z8249 Family history of ischemic heart disease and other diseases of the circulatory system: Secondary | ICD-10-CM

## 2014-12-02 DIAGNOSIS — E039 Hypothyroidism, unspecified: Secondary | ICD-10-CM | POA: Diagnosis present

## 2014-12-02 DIAGNOSIS — R509 Fever, unspecified: Secondary | ICD-10-CM | POA: Diagnosis not present

## 2014-12-02 DIAGNOSIS — J449 Chronic obstructive pulmonary disease, unspecified: Secondary | ICD-10-CM

## 2014-12-02 LAB — CBC WITH DIFFERENTIAL/PLATELET
Basophils Absolute: 0 10*3/uL (ref 0.0–0.1)
Basophils Relative: 0 % (ref 0–1)
EOS ABS: 0 10*3/uL (ref 0.0–0.7)
EOS PCT: 0 % (ref 0–5)
HCT: 35.2 % — ABNORMAL LOW (ref 36.0–46.0)
Hemoglobin: 11.8 g/dL — ABNORMAL LOW (ref 12.0–15.0)
LYMPHS ABS: 1 10*3/uL (ref 0.7–4.0)
Lymphocytes Relative: 6 % — ABNORMAL LOW (ref 12–46)
MCH: 30.4 pg (ref 26.0–34.0)
MCHC: 33.5 g/dL (ref 30.0–36.0)
MCV: 90.7 fL (ref 78.0–100.0)
Monocytes Absolute: 0.7 10*3/uL (ref 0.1–1.0)
Monocytes Relative: 4 % (ref 3–12)
Neutro Abs: 13.9 10*3/uL — ABNORMAL HIGH (ref 1.7–7.7)
Neutrophils Relative %: 90 % — ABNORMAL HIGH (ref 43–77)
PLATELETS: 277 10*3/uL (ref 150–400)
RBC: 3.88 MIL/uL (ref 3.87–5.11)
RDW: 13.2 % (ref 11.5–15.5)
WBC: 15.5 10*3/uL — ABNORMAL HIGH (ref 4.0–10.5)

## 2014-12-02 LAB — HEPATIC FUNCTION PANEL
ALBUMIN: 2.9 g/dL — AB (ref 3.5–5.2)
ALT: 28 U/L (ref 0–35)
AST: 34 U/L (ref 0–37)
Alkaline Phosphatase: 108 U/L (ref 39–117)
BILIRUBIN TOTAL: 1.2 mg/dL (ref 0.3–1.2)
Bilirubin, Direct: 0.6 mg/dL — ABNORMAL HIGH (ref 0.0–0.5)
Indirect Bilirubin: 0.6 mg/dL (ref 0.3–0.9)
Total Protein: 7.4 g/dL (ref 6.0–8.3)

## 2014-12-02 LAB — STREP PNEUMONIAE URINARY ANTIGEN: Strep Pneumo Urinary Antigen: NEGATIVE

## 2014-12-02 LAB — I-STAT CHEM 8, ED
BUN: 21 mg/dL (ref 6–23)
CALCIUM ION: 1.13 mmol/L (ref 1.13–1.30)
CHLORIDE: 102 mmol/L (ref 96–112)
Creatinine, Ser: 0.7 mg/dL (ref 0.50–1.10)
Glucose, Bld: 125 mg/dL — ABNORMAL HIGH (ref 70–99)
HCT: 39 % (ref 36.0–46.0)
Hemoglobin: 13.3 g/dL (ref 12.0–15.0)
Potassium: 2.8 mmol/L — ABNORMAL LOW (ref 3.5–5.1)
Sodium: 137 mmol/L (ref 135–145)
TCO2: 19 mmol/L (ref 0–100)

## 2014-12-02 LAB — PROTIME-INR
INR: 1.12 (ref 0.00–1.49)
Prothrombin Time: 14.5 seconds (ref 11.6–15.2)

## 2014-12-02 LAB — BASIC METABOLIC PANEL
Anion gap: 12 (ref 5–15)
BUN: 22 mg/dL (ref 6–23)
CALCIUM: 8.7 mg/dL (ref 8.4–10.5)
CO2: 21 mmol/L (ref 19–32)
CREATININE: 0.92 mg/dL (ref 0.50–1.10)
Chloride: 102 mmol/L (ref 96–112)
GFR, EST AFRICAN AMERICAN: 72 mL/min — AB (ref >90)
GFR, EST NON AFRICAN AMERICAN: 63 mL/min — AB (ref >90)
GLUCOSE: 126 mg/dL — AB (ref 70–99)
POTASSIUM: 2.7 mmol/L — AB (ref 3.5–5.1)
SODIUM: 135 mmol/L (ref 135–145)

## 2014-12-02 LAB — URINE MICROSCOPIC-ADD ON

## 2014-12-02 LAB — TSH: TSH: 1.241 u[IU]/mL (ref 0.350–4.500)

## 2014-12-02 LAB — APTT: APTT: 36 s (ref 24–37)

## 2014-12-02 LAB — LACTIC ACID, PLASMA
Lactic Acid, Venous: 1.8 mmol/L (ref 0.5–2.0)
Lactic Acid, Venous: 1.9 mmol/L (ref 0.5–2.0)

## 2014-12-02 LAB — URINALYSIS, ROUTINE W REFLEX MICROSCOPIC
Glucose, UA: NEGATIVE mg/dL
Ketones, ur: NEGATIVE mg/dL
NITRITE: NEGATIVE
PROTEIN: 100 mg/dL — AB
SPECIFIC GRAVITY, URINE: 1.025 (ref 1.005–1.030)
Urobilinogen, UA: 2 mg/dL — ABNORMAL HIGH (ref 0.0–1.0)
pH: 5.5 (ref 5.0–8.0)

## 2014-12-02 LAB — MRSA PCR SCREENING: MRSA by PCR: NEGATIVE

## 2014-12-02 LAB — INFLUENZA PANEL BY PCR (TYPE A & B)
H1N1 flu by pcr: NOT DETECTED
INFLAPCR: NEGATIVE
INFLBPCR: NEGATIVE

## 2014-12-02 LAB — I-STAT CG4 LACTIC ACID, ED: Lactic Acid, Venous: 2.48 mmol/L (ref 0.5–2.0)

## 2014-12-02 LAB — BRAIN NATRIURETIC PEPTIDE: B Natriuretic Peptide: 131.7 pg/mL — ABNORMAL HIGH (ref 0.0–100.0)

## 2014-12-02 LAB — I-STAT TROPONIN, ED: Troponin i, poc: 0 ng/mL (ref 0.00–0.08)

## 2014-12-02 LAB — PROCALCITONIN: Procalcitonin: 2.38 ng/mL

## 2014-12-02 MED ORDER — POTASSIUM CHLORIDE CRYS ER 20 MEQ PO TBCR
40.0000 meq | EXTENDED_RELEASE_TABLET | Freq: Two times a day (BID) | ORAL | Status: AC
  Administered 2014-12-02 (×2): 40 meq via ORAL
  Filled 2014-12-02 (×2): qty 2

## 2014-12-02 MED ORDER — LEVALBUTEROL HCL 1.25 MG/0.5ML IN NEBU
1.2500 mg | INHALATION_SOLUTION | RESPIRATORY_TRACT | Status: DC | PRN
  Administered 2014-12-03: 1.25 mg via RESPIRATORY_TRACT
  Filled 2014-12-02 (×3): qty 0.5

## 2014-12-02 MED ORDER — HYDROCODONE-ACETAMINOPHEN 5-325 MG PO TABS
1.0000 | ORAL_TABLET | ORAL | Status: DC | PRN
  Administered 2014-12-03 – 2014-12-05 (×2): 2 via ORAL
  Filled 2014-12-02 (×2): qty 2

## 2014-12-02 MED ORDER — DEXTROSE 5 % IV SOLN
1.0000 g | Freq: Once | INTRAVENOUS | Status: AC
  Administered 2014-12-02: 1 g via INTRAVENOUS
  Filled 2014-12-02: qty 10

## 2014-12-02 MED ORDER — GUAIFENESIN 100 MG/5ML PO SYRP
200.0000 mg | ORAL_SOLUTION | ORAL | Status: DC | PRN
  Administered 2014-12-03: 200 mg via ORAL
  Filled 2014-12-02 (×2): qty 10

## 2014-12-02 MED ORDER — SODIUM CHLORIDE 0.9 % IV BOLUS (SEPSIS)
1000.0000 mL | INTRAVENOUS | Status: AC
  Administered 2014-12-02 (×4): 1000 mL via INTRAVENOUS

## 2014-12-02 MED ORDER — LEVOFLOXACIN IN D5W 750 MG/150ML IV SOLN
750.0000 mg | INTRAVENOUS | Status: AC
Start: 2014-12-02 — End: 2014-12-06
  Administered 2014-12-02 – 2014-12-06 (×5): 750 mg via INTRAVENOUS
  Filled 2014-12-02 (×5): qty 150

## 2014-12-02 MED ORDER — LORAZEPAM 0.5 MG PO TABS
0.5000 mg | ORAL_TABLET | Freq: Three times a day (TID) | ORAL | Status: DC | PRN
  Administered 2014-12-02: 0.5 mg via ORAL
  Filled 2014-12-02 (×2): qty 1

## 2014-12-02 MED ORDER — TRAMADOL HCL 50 MG PO TABS
50.0000 mg | ORAL_TABLET | Freq: Four times a day (QID) | ORAL | Status: DC | PRN
  Administered 2014-12-02: 50 mg via ORAL
  Administered 2014-12-02 – 2014-12-03 (×3): 100 mg via ORAL
  Administered 2014-12-03: 50 mg via ORAL
  Administered 2014-12-04 – 2014-12-07 (×7): 100 mg via ORAL
  Administered 2014-12-07: 50 mg via ORAL
  Filled 2014-12-02 (×3): qty 2
  Filled 2014-12-02: qty 1
  Filled 2014-12-02 (×5): qty 2
  Filled 2014-12-02: qty 1
  Filled 2014-12-02 (×3): qty 2

## 2014-12-02 MED ORDER — DICYCLOMINE HCL 10 MG PO CAPS
10.0000 mg | ORAL_CAPSULE | Freq: Every day | ORAL | Status: DC | PRN
  Filled 2014-12-02: qty 1

## 2014-12-02 MED ORDER — ASPIRIN 81 MG PO CHEW
81.0000 mg | CHEWABLE_TABLET | Freq: Every morning | ORAL | Status: DC
  Administered 2014-12-02 – 2014-12-07 (×6): 81 mg via ORAL
  Filled 2014-12-02 (×6): qty 1

## 2014-12-02 MED ORDER — IPRATROPIUM-ALBUTEROL 20-100 MCG/ACT IN AERS: 1.0000 | INHALATION_SPRAY | Freq: Four times a day (QID) | RESPIRATORY_TRACT | Status: DC

## 2014-12-02 MED ORDER — DEXTROSE 5 % IV SOLN
500.0000 mg | Freq: Once | INTRAVENOUS | Status: AC
  Administered 2014-12-02: 500 mg via INTRAVENOUS
  Filled 2014-12-02: qty 500

## 2014-12-02 MED ORDER — ENOXAPARIN SODIUM 60 MG/0.6ML ~~LOC~~ SOLN
60.0000 mg | SUBCUTANEOUS | Status: DC
  Administered 2014-12-02 – 2014-12-06 (×5): 60 mg via SUBCUTANEOUS
  Filled 2014-12-02 (×6): qty 0.6

## 2014-12-02 MED ORDER — ACETAMINOPHEN 325 MG PO TABS
650.0000 mg | ORAL_TABLET | Freq: Once | ORAL | Status: AC
  Administered 2014-12-02: 650 mg via ORAL
  Filled 2014-12-02: qty 2

## 2014-12-02 MED ORDER — METHYLPREDNISOLONE SODIUM SUCC 125 MG IJ SOLR
60.0000 mg | Freq: Four times a day (QID) | INTRAMUSCULAR | Status: DC
  Administered 2014-12-02 – 2014-12-03 (×3): 60 mg via INTRAVENOUS
  Filled 2014-12-02 (×4): qty 2

## 2014-12-02 MED ORDER — DEXTROSE 5 % IV SOLN: 500.0000 mg | INTRAVENOUS | Status: DC

## 2014-12-02 MED ORDER — ACETAMINOPHEN 500 MG PO TABS
500.0000 mg | ORAL_TABLET | Freq: Four times a day (QID) | ORAL | Status: DC | PRN
Start: 2014-12-02 — End: 2014-12-07

## 2014-12-02 MED ORDER — VENLAFAXINE HCL 37.5 MG PO TABS
62.5000 mg | ORAL_TABLET | Freq: Every morning | ORAL | Status: DC
  Administered 2014-12-02 – 2014-12-07 (×6): 62.5 mg via ORAL
  Filled 2014-12-02 (×6): qty 1

## 2014-12-02 MED ORDER — OMEGA-3-ACID ETHYL ESTERS 1 G PO CAPS
2.0000 g | ORAL_CAPSULE | Freq: Two times a day (BID) | ORAL | Status: DC
  Administered 2014-12-02 – 2014-12-07 (×10): 2 g via ORAL
  Filled 2014-12-02 (×11): qty 2

## 2014-12-02 MED ORDER — SODIUM CHLORIDE 0.9 % IV SOLN
INTRAVENOUS | Status: AC
  Administered 2014-12-02: 15:00:00 via INTRAVENOUS

## 2014-12-02 MED ORDER — ONDANSETRON HCL 4 MG PO TABS: 4.0000 mg | ORAL_TABLET | Freq: Four times a day (QID) | ORAL | Status: DC | PRN

## 2014-12-02 MED ORDER — PREDNISONE 20 MG PO TABS
60.0000 mg | ORAL_TABLET | Freq: Once | ORAL | Status: AC
  Administered 2014-12-02: 60 mg via ORAL
  Filled 2014-12-02: qty 3

## 2014-12-02 MED ORDER — IPRATROPIUM-ALBUTEROL 0.5-2.5 (3) MG/3ML IN SOLN
3.0000 mL | Freq: Once | RESPIRATORY_TRACT | Status: AC
  Administered 2014-12-02: 3 mL via RESPIRATORY_TRACT
  Filled 2014-12-02: qty 3

## 2014-12-02 MED ORDER — ALBUTEROL SULFATE HFA 108 (90 BASE) MCG/ACT IN AERS: 2.0000 | INHALATION_SPRAY | Freq: Three times a day (TID) | RESPIRATORY_TRACT | Status: DC

## 2014-12-02 MED ORDER — OSELTAMIVIR PHOSPHATE 75 MG PO CAPS
75.0000 mg | ORAL_CAPSULE | Freq: Once | ORAL | Status: AC
  Administered 2014-12-02: 75 mg via ORAL
  Filled 2014-12-02: qty 1

## 2014-12-02 MED ORDER — PANTOPRAZOLE SODIUM 40 MG PO TBEC
40.0000 mg | DELAYED_RELEASE_TABLET | Freq: Every day | ORAL | Status: DC
  Administered 2014-12-02 – 2014-12-07 (×6): 40 mg via ORAL
  Filled 2014-12-02 (×6): qty 1

## 2014-12-02 MED ORDER — VANCOMYCIN HCL IN DEXTROSE 750-5 MG/150ML-% IV SOLN
750.0000 mg | Freq: Two times a day (BID) | INTRAVENOUS | Status: DC
  Administered 2014-12-03: 750 mg via INTRAVENOUS
  Filled 2014-12-02 (×2): qty 150

## 2014-12-02 MED ORDER — POTASSIUM CHLORIDE CRYS ER 20 MEQ PO TBCR
40.0000 meq | EXTENDED_RELEASE_TABLET | Freq: Once | ORAL | Status: AC
  Administered 2014-12-02: 40 meq via ORAL
  Filled 2014-12-02: qty 2

## 2014-12-02 MED ORDER — LEVALBUTEROL HCL 1.25 MG/0.5ML IN NEBU
1.2500 mg | INHALATION_SOLUTION | Freq: Four times a day (QID) | RESPIRATORY_TRACT | Status: DC
  Administered 2014-12-02 – 2014-12-05 (×12): 1.25 mg via RESPIRATORY_TRACT
  Filled 2014-12-02 (×14): qty 0.5

## 2014-12-02 MED ORDER — PREGABALIN 50 MG PO CAPS
100.0000 mg | ORAL_CAPSULE | Freq: Three times a day (TID) | ORAL | Status: DC
Start: 2014-12-02 — End: 2014-12-07
  Administered 2014-12-02 – 2014-12-07 (×15): 100 mg via ORAL
  Filled 2014-12-02: qty 1
  Filled 2014-12-02 (×3): qty 2
  Filled 2014-12-02: qty 1
  Filled 2014-12-02 (×4): qty 2
  Filled 2014-12-02: qty 1
  Filled 2014-12-02 (×5): qty 2

## 2014-12-02 MED ORDER — PRAVASTATIN SODIUM 20 MG PO TABS
20.0000 mg | ORAL_TABLET | Freq: Every day | ORAL | Status: DC
Start: 2014-12-02 — End: 2014-12-07
  Administered 2014-12-02 – 2014-12-06 (×5): 20 mg via ORAL
  Filled 2014-12-02 (×6): qty 1

## 2014-12-02 MED ORDER — ONDANSETRON HCL 4 MG/2ML IJ SOLN
4.0000 mg | Freq: Four times a day (QID) | INTRAMUSCULAR | Status: DC | PRN
  Administered 2014-12-04: 4 mg via INTRAVENOUS
  Filled 2014-12-02: qty 2

## 2014-12-02 MED ORDER — CALCIUM CARBONATE ANTACID 500 MG PO CHEW: 2.0000 | CHEWABLE_TABLET | Freq: Every evening | ORAL | Status: DC | PRN

## 2014-12-02 MED ORDER — THYROID 120 MG PO TABS
120.0000 mg | ORAL_TABLET | Freq: Every day | ORAL | Status: DC
  Administered 2014-12-02 – 2014-12-07 (×6): 120 mg via ORAL
  Filled 2014-12-02 (×7): qty 1

## 2014-12-02 MED ORDER — ENOXAPARIN SODIUM 40 MG/0.4ML ~~LOC~~ SOLN: 40.0000 mg | SUBCUTANEOUS | Status: DC

## 2014-12-02 MED ORDER — VANCOMYCIN HCL IN DEXTROSE 1-5 GM/200ML-% IV SOLN: 1000.0000 mg | Freq: Once | INTRAVENOUS | Status: DC

## 2014-12-02 MED ORDER — LEVOFLOXACIN IN D5W 750 MG/150ML IV SOLN: 750.0000 mg | Freq: Once | INTRAVENOUS | Status: DC

## 2014-12-02 MED ORDER — DEXTROSE 5 % IV SOLN: 1.0000 g | INTRAVENOUS | Status: DC

## 2014-12-02 MED ORDER — THYROID 30 MG PO TABS
30.0000 mg | ORAL_TABLET | Freq: Every day | ORAL | Status: DC
  Administered 2014-12-02 – 2014-12-07 (×6): 30 mg via ORAL
  Filled 2014-12-02 (×7): qty 1

## 2014-12-02 MED ORDER — FENTANYL CITRATE 0.05 MG/ML IJ SOLN
50.0000 ug | INTRAMUSCULAR | Status: DC | PRN
  Administered 2014-12-02: 50 ug via INTRAVENOUS
  Filled 2014-12-02: qty 2

## 2014-12-02 MED ORDER — VANCOMYCIN HCL 10 G IV SOLR
2500.0000 mg | INTRAVENOUS | Status: DC
  Administered 2014-12-02: 2500 mg via INTRAVENOUS
  Filled 2014-12-02 (×2): qty 2500

## 2014-12-02 MED ORDER — DM-GUAIFENESIN ER 30-600 MG PO TB12
1.0000 | ORAL_TABLET | Freq: Two times a day (BID) | ORAL | Status: DC
  Administered 2014-12-02 – 2014-12-07 (×10): 1 via ORAL
  Filled 2014-12-02 (×12): qty 1
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
## 2014-12-02 NOTE — Progress Notes (Signed)
CARE MANAGEMENT NOTE 12/02/2014  Patient:  Shelly Mosley, Shelly Mosley   Account Number:  [id number]  Date Initiated:  12/02/2014  Documentation initiated by:  DAVIS,RHONDA  Subjective/Objective Assessment:   sepsis     Action/Plan:   home when stable   Anticipated DC Date:  12/05/2014   Anticipated DC Plan:  HOME/SELF CARE  In-house referral  NA         Choice offered to / List presented to:             Status of service:  In process, will continue to follow Medicare Important Message given?   (If response is "NO", the following Medicare IM given date fields will be blank) Date Medicare IM given:   Medicare IM given by:   Date Additional Medicare IM given:   Additional Medicare IM given by:    Discharge Disposition:    Per UR Regulation:  Reviewed for med. necessity/level of care/duration of stay  If discussed at [place] of Stay Meetings, dates discussed:    Comments:  December 02, 2014/Rhonda L. Rosana Hoes, RN, BSN, CCM. Case Management [place] [phone] No discharge needs present of time of review.
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
## 2014-12-02 NOTE — ED Notes (Signed)
Pt b/p dropped for initial finding post bed transfer.  Pt is nauseated.  Repeat stable.

## 2014-12-02 NOTE — ED Notes (Signed)
Bed: WA10 Expected date:  Expected time:  Means of arrival:  Comments: Ems  
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
## 2014-12-02 NOTE — ED Notes (Signed)
Per EMS, pt with shortness of breath, fever body aches since Thursday.  COPD hx.  Initial  bp 130/80, hr 130, cbg 123, 95% 2l,  18 g RAC.  700 cc NS  , ending 116/70, hr 105 ending vs.  2 neb treatments in route.

## 2014-12-03 LAB — BASIC METABOLIC PANEL
ANION GAP: 8 (ref 5–15)
BUN: 17 mg/dL (ref 6–23)
CHLORIDE: 111 mmol/L (ref 96–112)
CO2: 21 mmol/L (ref 19–32)
CREATININE: 0.6 mg/dL (ref 0.50–1.10)
Calcium: 8.3 mg/dL — ABNORMAL LOW (ref 8.4–10.5)
GFR calc Af Amer: >90 mL/min (ref >90)
GFR calc non Af Amer: >90 mL/min (ref >90)
GLUCOSE: 129 mg/dL — AB (ref 70–99)
Potassium: 4.3 mmol/L (ref 3.5–5.1)
Sodium: 140 mmol/L (ref 135–145)

## 2014-12-03 LAB — CBC
HCT: 34.2 % — ABNORMAL LOW (ref 36.0–46.0)
Hemoglobin: 11.1 g/dL — ABNORMAL LOW (ref 12.0–15.0)
MCH: 30.2 pg (ref 26.0–34.0)
MCHC: 32.5 g/dL (ref 30.0–36.0)
MCV: 93.2 fL (ref 78.0–100.0)
PLATELETS: 273 10*3/uL (ref 150–400)
RBC: 3.67 MIL/uL — ABNORMAL LOW (ref 3.87–5.11)
RDW: 13.6 % (ref 11.5–15.5)
WBC: 17.8 10*3/uL — AB (ref 4.0–10.5)

## 2014-12-03 LAB — LEGIONELLA ANTIGEN, URINE

## 2014-12-03 LAB — URINE CULTURE
Colony Count: NO GROWTH
Culture: NO GROWTH

## 2014-12-03 MED ORDER — METHYLPREDNISOLONE SODIUM SUCC 40 MG IJ SOLR
40.0000 mg | Freq: Once | INTRAMUSCULAR | Status: AC
  Administered 2014-12-03: 40 mg via INTRAVENOUS

## 2014-12-03 MED ORDER — VANCOMYCIN HCL IN DEXTROSE 750-5 MG/150ML-% IV SOLN
750.0000 mg | Freq: Two times a day (BID) | INTRAVENOUS | Status: DC
  Administered 2014-12-03 – 2014-12-05 (×4): 750 mg via INTRAVENOUS
  Filled 2014-12-03 (×5): qty 150

## 2014-12-03 MED ORDER — GUAIFENESIN 100 MG/5ML PO SOLN
200.0000 mg | ORAL | Status: DC | PRN
  Administered 2014-12-03: 200 mg via ORAL

## 2014-12-03 MED ORDER — METHYLPREDNISOLONE SODIUM SUCC 40 MG IJ SOLR
40.0000 mg | Freq: Two times a day (BID) | INTRAMUSCULAR | Status: DC
  Administered 2014-12-04 – 2014-12-05 (×4): 40 mg via INTRAVENOUS
  Filled 2014-12-03 (×6): qty 1

## 2014-12-03 NOTE — Progress Notes (Addendum)
Patient ID: Shelly Mosley, female   DOB: Dec 17, 1945, 69 y.o.   MRN: [id number]  [facility] PROGRESS NOTE  ARIYAN SINNETT KWI:[id number] DOB: [birth year] DOA: 12/02/2014 PCP: Dwan Bolt, MD   Brief narrative:    Patient is a 69 year old female with COPD not requiring oxygen, current smoker, hyperlipidemia, IBS, presented to [facility] emergency department with one week duration of progressively worsening dyspnea that initially started with exertion and has progressed to dyspnea at rest over the past 24 hours, associated with fevers of 102 F, chills, productive cough of clear and yellow sputum, sore throat, malaise and poor oral intake. Patient denies any specific alleviating or aggravating factors. Patient denies chest pain other than the one present with coughing spells, no abdominal or urinary concerns. Patient denies history of DVT or pulmonary embolus. She denies orthopnea, leg swelling. She does report recent exposure to sister who was sick with viral symptoms.  In emergency department, patient noted to be in mild distress due to dyspnea. Vital signs notable for temp 103.1 after, heart rate 108, respiratory rate 27 breaths per minute, blood pressure 74/62. Chest x-ray notable for right upper lobe consolidation concerning for pneumonia. TR[facility] asked to admit to step down unit for evaluation and management of presumptive sepsis secondary to pneumonia.  Assessment/Plan:    Active Problems:   Sepsis secondary to CAP (community acquired pneumonia) - criteria for sepsis met on admission: T103F, HR 108, RR 27, BP 74/62, WBC 15.5 - pt is clinically improving but still mild exp wheezing - continue vanc and Levaquin day #2/7  - continue to provide oxyge via [place], BD's  - no need for IVF, pt tolerating diet well  - WBC up likely from steroids  Acute respiratory failure with hypoxia, oxygen saturation 90% on room air - Secondary to pneumonia imposed on acute on chronic COPD exacerbation  -  Antibiotics as noted above - continue bronchodilators scheduled and as needed, start tapering down solumedrol - continue to provide Mucinex twice a day, antitussives as needed Acute on chronic COPD - management as noted above with planned tapering of solumedrol from 60 mg Q6 hours to 40 mg BID CAP, lobar PNA, RUL, no known pathogen  - ABX as noted above - follow upon sputum culture, urine legionella and strep pneumo  Hypokalemia - from Lasix - supplemented and WNL Hypotension - in the setting of sepsis  - still low but SBP > 90 - hold home medical regimen until BP stabilizes  Morbid obesity  - Body mass index is 40.7  HLD - continue statin DVT prophylaxis - Lovenox SQ  Code Status: Full.  Family Communication:  plan of care discussed with the patient Disposition Plan: Home when stable.  Time spent 25 minutes   IV access:  Peripheral IV  Procedures and diagnostic studies:    Dg Chest Port 1 View  12/02/2014  RIGHT upper lobe consolidation compatible with pneumonia. Followup in 4-6 weeks to ensure radiographic clearing and exclude an underlying lesion is recommended.    Medical Consultants:  None   Other Consultants:  None   IAnti-Infectives:   Vanc 4/13 --> Levaquin 4/13 -->  Faye Ramsay, MD  [facility] Pager [phone]  If 7PM-7AM, please contact night-coverage [email or web address] Password [email or web address] 12/03/2014, 11:55 AM   LOS: 1 day   HPI/Subjective: Still with exertional dyspnea   Objective: Filed Vitals:   12/03/14 0509 12/03/14 0714 12/03/14 0800 12/03/14 1152  BP:   94/50   Pulse:   63   Temp:  97.4 F (36.3 C)    TempSrc:  Axillary    Resp:   15   Height:      Weight:      SpO2: 97%  98% 100%    Intake/Output Summary (Last 24 hours) at 12/03/14 1155 Last data filed at 12/03/14 0858  Gross per 24 hour  Intake   1380 ml  Output   1325 ml  Net     55 ml    Exam:   General:  Pt is alert, follows commands appropriately, not in acute  distress  Cardiovascular: Regular rate and rhythm, S1/S2, no murmurs, no rubs, no gallops  Respiratory: Clear to auscultation bilaterally, scattered rhonchi with mild exp wheezing    Abdomen: Soft, non tender, non distended, bowel sounds present, no guarding  Extremities: No edema, pulses DP and PT palpable bilaterally  Neuro: Grossly nonfocal  Data Reviewed: Basic Metabolic Panel:  Recent Labs Lab 12/02/14 0831 12/02/14 0845 12/03/14 0334  NA 135 137 140  K 2.7* 2.8* 4.3  CL 102 102 111  CO2 21  --  21  GLUCOSE 126* 125* 129*  BUN _0 CREATININE 0.92 0.70 0.60  CALCIUM 8.7  --  8.3*   Liver Function Tests:  Recent Labs Lab 12/02/14 0831  AST 34  ALT 28  ALKPHOS 108  BILITOT 1.2  PROT 7.4  ALBUMIN 2.9*   CBC:  Recent Labs Lab 12/02/14 0831 12/02/14 0845 12/03/14 0334  WBC 15.5*  --  17.8*  NEUTROABS 13.9*  --   --   HGB 11.8* 13.3 11.1*  HCT 35.2* 39.0 34.2*  MCV 90.7  --  93.2  PLT 277  --  273     Recent Results (from the past 240 hour(s))  Blood Culture (routine x 2)     Status: None (Preliminary result)   Collection Time: 12/02/14  8:31 AM  Result Value Ref Range Status   Specimen Description BLOOD RIGHT HAND  Final   Special Requests BOTTLES DRAWN AEROBIC AND ANAEROBIC 5ML  Final   Culture   Final           BLOOD CULTURE RECEIVED NO GROWTH TO DATE CULTURE WILL BE HELD FOR 5 DAYS BEFORE ISSUING A FINAL NEGATIVE REPORT Performed at [facility]    Report Status PENDING  Incomplete  Blood Culture (routine x 2)     Status: None (Preliminary result)   Collection Time: 12/02/14  8:35 AM  Result Value Ref Range Status   Specimen Description BLOOD RIGHT FOREARM  Final   Special Requests BOTTLES DRAWN AEROBIC AND ANAEROBIC 5CC  Final   Culture   Final           BLOOD CULTURE RECEIVED NO GROWTH TO DATE CULTURE WILL BE HELD FOR 5 DAYS BEFORE ISSUING A FINAL NEGATIVE REPORT Performed at [facility]    Report Status PENDING   Incomplete  MRSA PCR Screening     Status: None   Collection Time: 12/02/14 10:55 AM  Result Value Ref Range Status   MRSA by PCR NEGATIVE NEGATIVE Final    Comment:        The GeneXpert MRSA Assay (FDA approved for NASAL specimens only), is one component of a comprehensive MRSA colonization surveillance program. It is not intended to diagnose MRSA infection nor to guide or monitor treatment for MRSA infections.      Scheduled Meds: . aspirin  81 mg Oral q morning - 10a  . dextromethorphan-guaiFENesin  1 tablet Oral BID  . enoxaparin (LOVENOX) injection  60 mg Subcutaneous Q24H  . levalbuterol  1.25 mg Nebulization 4 times per day  . levofloxacin (LEVAQUIN) IV  750 mg Intravenous Q24H  . methylPREDNISolone (SOLU-MEDROL) injection  60 mg Intravenous 4 times per day  . omega-3 acid ethyl esters  2 g Oral BID  . pantoprazole  40 mg Oral Daily  . pravastatin  20 mg Oral q1800  . pregabalin  100 mg Oral TID  . thyroid  120 mg Oral QAC breakfast  . thyroid  30 mg Oral QAC breakfast  . vancomycin  2,500 mg Intravenous Q24H  . vancomycin  750 mg Intravenous Q12H  . venlafaxine  62.5 mg Oral q morning - 10a   Continuous Infusions:

## 2014-12-04 ENCOUNTER — Inpatient Hospital Stay (HOSPITAL_COMMUNITY): Payer: Medicare Other

## 2014-12-04 LAB — CBC
HEMATOCRIT: 33.7 % — AB (ref 36.0–46.0)
Hemoglobin: 10.7 g/dL — ABNORMAL LOW (ref 12.0–15.0)
MCH: 29.7 pg (ref 26.0–34.0)
MCHC: 31.8 g/dL (ref 30.0–36.0)
MCV: 93.6 fL (ref 78.0–100.0)
Platelets: 331 10*3/uL (ref 150–400)
RBC: 3.6 MIL/uL — ABNORMAL LOW (ref 3.87–5.11)
RDW: 14.1 % (ref 11.5–15.5)
WBC: 19.3 10*3/uL — AB (ref 4.0–10.5)

## 2014-12-04 LAB — BASIC METABOLIC PANEL
ANION GAP: 6 (ref 5–15)
BUN: 26 mg/dL — ABNORMAL HIGH (ref 6–23)
CO2: 24 mmol/L (ref 19–32)
Calcium: 8.8 mg/dL (ref 8.4–10.5)
Chloride: 108 mmol/L (ref 96–112)
Creatinine, Ser: 0.64 mg/dL (ref 0.50–1.10)
GFR calc Af Amer: >90 mL/min (ref >90)
GFR, EST NON AFRICAN AMERICAN: 90 mL/min — AB (ref >90)
Glucose, Bld: 137 mg/dL — ABNORMAL HIGH (ref 70–99)
Potassium: 4.4 mmol/L (ref 3.5–5.1)
Sodium: 138 mmol/L (ref 135–145)

## 2014-12-04 NOTE — Progress Notes (Signed)
Patient is constantly incontinent due to stress incontinence.  Unable to accurately monitor output.  Will continue to monitor patient.

## 2014-12-04 NOTE — Progress Notes (Signed)
Patient ID: Shelly Mosley, female   DOB: 07/27/1946, 69 y.o.   MRN: [id number]  [facility] PROGRESS NOTE  TARAH BUBOLTZ HXT:[id number] DOB: [birth year] DOA: 12/02/2014 PCP: Dwan Bolt, MD   Brief narrative:    Patient is a 69 year old female with COPD not requiring oxygen, current smoker, hyperlipidemia, IBS, presented to [facility] emergency department with one week duration of progressively worsening dyspnea that initially started with exertion and has progressed to dyspnea at rest over the past 24 hours, associated with fevers of 102 F, chills, productive cough of clear and yellow sputum, sore throat, malaise and poor oral intake. Patient denies any specific alleviating or aggravating factors. Patient denies chest pain other than the one present with coughing spells, no abdominal or urinary concerns. Patient denies history of DVT or pulmonary embolus. She denies orthopnea, leg swelling. She does report recent exposure to sister who was sick with viral symptoms.  In emergency department, patient noted to be in mild distress due to dyspnea. Vital signs notable for temp 103.1 after, heart rate 108, respiratory rate 27 breaths per minute, blood pressure 74/62. Chest x-ray notable for right upper lobe consolidation concerning for pneumonia. TRH asked to admit to step down unit for evaluation and management of presumptive sepsis secondary to pneumonia.  Assessment/Plan:    Active Problems:   Sepsis secondary to CAP (community acquired pneumonia) - criteria for sepsis met on admission: T103F, HR 108, RR 27, BP 74/62, WBC 15.5 - pt is clinically improving but continues to have expiratory wheezing on exam  - we'll continue vancomycin and Levaquin day #3/7 - continue to provide oxyge via [place], BD's  - no need for IVF, pt tolerating diet well  - WBC up likely from steroids  Acute respiratory failure with hypoxia, oxygen saturation 90% on room air - Secondary to pneumonia imposed on acute on  chronic COPD exacerbation  - Antibiotics as noted above - continue bronchodilators scheduled and as needed - Still requiring oxygen via nasal cannula - started to taper Solu-Medrol down 4/14 but will hold off on further tapering due to persistent wheezing with expiration  - continue to provide Mucinex twice a day, antitussives as needed - Plan to repeat chest x-ray today for follow-up Acute on chronic COPD - management as noted above - Keep on same dose of Solu-Medrol for now  CAP, lobar PNA, RUL, no known pathogen  - ABX as noted above - follow upon sputum culture, urine legionella and strep pneumo,  so far negative to date  Anemia of chronic disease, iron deficiency  - Review of records indicate hemoglobin in 2014 ~ 10  - No signs of active bleeding  - Repeat CBC in the morning  Hypokalemia - from Lasix - supplemented and WNL Hypotension - in the setting of sepsis  - much better but still on low end of normal  - hold home medical regimen until BP stabilizes  Morbid obesity  - Body mass index is 40.7  HLD - continue statin DVT prophylaxis - Lovenox SQ  Code Status: Full.  Family Communication:  plan of care discussed with the patient Disposition Plan: Home when stable.   Time spent 25 minutes   IV access:  Peripheral IV  Procedures and diagnostic studies:    Dg Chest Port 1 View  12/02/2014  RIGHT upper lobe consolidation compatible with pneumonia. Followup in 4-6 weeks to ensure radiographic clearing and exclude an underlying lesion is recommended.    Medical Consultants:  None  Other Consultants:  None   IAnti-Infectives:   Vanc 4/13 --> Levaquin 4/13 -->  Faye Ramsay, MD  [facility] Pager [phone]  If 7PM-7AM, please contact night-coverage [email or web address] Password [facility] 12/04/2014, 12:26 PM   LOS: 2 days   HPI/Subjective: Still with exertional dyspnea and exp wheezing, more productive cough.   Objective: Filed Vitals:   12/04/14 1018 12/04/14 1020  12/04/14 1021 12/04/14 1203  BP:      Pulse:      Temp:      TempSrc:      Resp:      Height:      Weight:      SpO2: 96% 81% 94% 97%    Intake/Output Summary (Last 24 hours) at 12/04/14 1226 Last data filed at 12/04/14 0837  Gross per 24 hour  Intake   1380 ml  Output    250 ml  Net   1130 ml    Exam:   General:  Pt is alert, follows commands appropriately, not in acute distress  Cardiovascular: Regular rate and rhythm, S1/S2, no murmurs, no rubs, no gallops  Respiratory: Clear to auscultation bilaterally, scattered rhonchi, exp wheezing, course cough   Abdomen: Soft, non tender, non distended, bowel sounds present, no guarding  Extremities: No edema, pulses DP and PT palpable bilaterally  Neuro: Grossly nonfocal  Data Reviewed: Basic Metabolic Panel:  Recent Labs Lab 12/02/14 0831 12/02/14 0845 12/03/14 0334 12/04/14 0450  NA 135 137 140 138  K 2.7* 2.8* 4.3 4.4  CL 102 102 111 108  CO2 21  --  21 24  GLUCOSE 126* 125* 129* 137*  BUN _0 26*  CREATININE 0.92 0.70 0.60 0.64  CALCIUM 8.7  --  8.3* 8.8   Liver Function Tests:  Recent Labs Lab 12/02/14 0831  AST 34  ALT 28  ALKPHOS 108  BILITOT 1.2  PROT 7.4  ALBUMIN 2.9*   CBC:  Recent Labs Lab 12/02/14 0831 12/02/14 0845 12/03/14 0334 12/04/14 0450  WBC 15.5*  --  17.8* 19.3*  NEUTROABS 13.9*  --   --   --   HGB 11.8* 13.3 11.1* 10.7*  HCT 35.2* 39.0 34.2* 33.7*  MCV 90.7  --  93.2 93.6  PLT 277  --  273 331     Recent Results (from the past 240 hour(s))  Blood Culture (routine x 2)     Status: None (Preliminary result)   Collection Time: 12/02/14  8:31 AM  Result Value Ref Range Status   Specimen Description BLOOD RIGHT HAND  Final   Special Requests BOTTLES DRAWN AEROBIC AND ANAEROBIC 5ML  Final   Culture   Final           BLOOD CULTURE RECEIVED NO GROWTH TO DATE CULTURE WILL BE HELD FOR 5 DAYS BEFORE ISSUING A FINAL NEGATIVE REPORT Performed at [facility]     Report Status PENDING  Incomplete  Blood Culture (routine x 2)     Status: None (Preliminary result)   Collection Time: 12/02/14  8:35 AM  Result Value Ref Range Status   Specimen Description BLOOD RIGHT FOREARM  Final   Special Requests BOTTLES DRAWN AEROBIC AND ANAEROBIC 5CC  Final   Culture   Final           BLOOD CULTURE RECEIVED NO GROWTH TO DATE CULTURE WILL BE HELD FOR 5 DAYS BEFORE ISSUING A FINAL NEGATIVE REPORT Performed at [facility]    Report Status PENDING  Incomplete  Urine  culture     Status: None   Collection Time: 12/02/14  9:25 AM  Result Value Ref Range Status   Specimen Description URINE, CLEAN CATCH  Final   Special Requests NONE  Final   Report Status 12/03/2014 FINAL  Final  MRSA PCR Screening     Status: None   Collection Time: 12/02/14 10:55 AM  Result Value Ref Range Status   MRSA by PCR NEGATIVE NEGATIVE Final     Scheduled Meds: . aspirin  81 mg Oral q morning - 10a  . dextromethorphan-guaiFENesin  1 tablet Oral BID  . enoxaparin (LOVENOX) injection  60 mg Subcutaneous Q24H  . levalbuterol  1.25 mg Nebulization 4 times per day  . levofloxacin (LEVAQUIN) IV  750 mg Intravenous Q24H  . methylPREDNISolone (SOLU-MEDROL) injection  40 mg Intravenous Q12H  . omega-3 acid ethyl esters  2 g Oral BID  . pantoprazole  40 mg Oral Daily  . pravastatin  20 mg Oral q1800  . pregabalin  100 mg Oral TID  . thyroid  120 mg Oral QAC breakfast  . thyroid  30 mg Oral QAC breakfast  . vancomycin  750 mg Intravenous Q12H  . venlafaxine  62.5 mg Oral q morning - 10a   Continuous Infusions:

## 2014-12-04 NOTE — Progress Notes (Signed)
Patient oxygen saturation while sitting on room air is 96%.  Patient O2 sats while walking on RA 81%.  While walking on 2L 95%.  Will continue to monitor.

## 2014-12-05 LAB — BASIC METABOLIC PANEL
Anion gap: 7 (ref 5–15)
BUN: 23 mg/dL (ref 6–23)
CHLORIDE: 107 mmol/L (ref 96–112)
CO2: 24 mmol/L (ref 19–32)
CREATININE: 0.64 mg/dL (ref 0.50–1.10)
Calcium: 8.7 mg/dL (ref 8.4–10.5)
GFR calc Af Amer: >90 mL/min (ref >90)
GFR calc non Af Amer: 90 mL/min — ABNORMAL LOW (ref >90)
GLUCOSE: 105 mg/dL — AB (ref 70–99)
Potassium: 5.4 mmol/L — ABNORMAL HIGH (ref 3.5–5.1)
SODIUM: 138 mmol/L (ref 135–145)

## 2014-12-05 LAB — CBC
HEMATOCRIT: 35.2 % — AB (ref 36.0–46.0)
HEMOGLOBIN: 11.3 g/dL — AB (ref 12.0–15.0)
MCH: 30.5 pg (ref 26.0–34.0)
MCHC: 32.1 g/dL (ref 30.0–36.0)
MCV: 95.1 fL (ref 78.0–100.0)
PLATELETS: 322 10*3/uL (ref 150–400)
RBC: 3.7 MIL/uL — AB (ref 3.87–5.11)
RDW: 14.5 % (ref 11.5–15.5)
WBC: 14.3 10*3/uL — ABNORMAL HIGH (ref 4.0–10.5)

## 2014-12-05 LAB — VANCOMYCIN, TROUGH: Vancomycin Tr: 10 ug/mL (ref 10.0–20.0)

## 2014-12-05 MED ORDER — VANCOMYCIN HCL IN DEXTROSE 1-5 GM/200ML-% IV SOLN
1000.0000 mg | Freq: Two times a day (BID) | INTRAVENOUS | Status: DC
  Administered 2014-12-05: 1000 mg via INTRAVENOUS
  Filled 2014-12-05: qty 200

## 2014-12-05 MED ORDER — LEVALBUTEROL HCL 0.63 MG/3ML IN NEBU
0.6300 mg | INHALATION_SOLUTION | Freq: Four times a day (QID) | RESPIRATORY_TRACT | Status: DC | PRN
  Filled 2014-12-05: qty 0.26

## 2014-12-05 MED ORDER — LEVALBUTEROL HCL 1.25 MG/0.5ML IN NEBU
0.6300 mg | INHALATION_SOLUTION | Freq: Three times a day (TID) | RESPIRATORY_TRACT | Status: DC
  Filled 2014-12-05 (×3): qty 0.26

## 2014-12-05 MED ORDER — METHYLPREDNISOLONE SODIUM SUCC 40 MG IJ SOLR
40.0000 mg | Freq: Every day | INTRAMUSCULAR | Status: DC
  Administered 2014-12-06: 40 mg via INTRAVENOUS
  Filled 2014-12-05: qty 1

## 2014-12-05 NOTE — Progress Notes (Signed)
Patient states she is having no difficulty breathing and would like a decrease in her BD regimen. RT treatment protocol assessment completed. Orders changed according to acuity score. RT to continue to follow.
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
## 2014-12-05 NOTE — Evaluation (Signed)
Physical Therapy Evaluation Patient Details Name: Shelly Mosley MRN: [id number] DOB: Aug 27, 1945 Today's Date: 12/05/2014   History of Present Illness  69 y/o WF admitted with sepsis secondary to CAP.  Clinical Impression  Pt admitted with above diagnosis. Pt currently with functional limitations due to the deficits listed below (see PT Problem List). Pt will benefit from skilled PT to increase their independence and safety with mobility to allow discharge to the venue listed below.  Pt is moving well overall, but de-sat with ambulation from bed to bathroom and with toileting while on room air.  O2 sats 90% on 2 L/min with gait.  Will see acutely, but no follow up PT needed.     Follow Up Recommendations No PT follow up    Equipment Recommendations  None recommended by PT    Recommendations for Other Services       Precautions / Restrictions Precautions Precautions: Other (comment) Precaution Comments: monitor o2 Restrictions Weight Bearing Restrictions: No      Mobility  Bed Mobility Overal bed mobility: Modified Independent                Transfers Overall transfer level: Modified independent                  Ambulation/Gait Ambulation/Gait assistance: Supervision;Min guard Ambulation Distance (Feet): 300 Feet Assistive device: None Gait Pattern/deviations: Step-through pattern     General Gait Details: Pt ambulated to bathroom on room air and o2 sats dropped to 84%.  Re-applied o2 and ambulated at 2 L with o2 sat at 90%.  Respiratory entering upon exit and informed.  Stairs            Wheelchair Mobility    Modified Rankin (Stroke Patients Only)       Balance Overall balance assessment: No apparent balance deficits (not formally assessed)                                           Pertinent Vitals/Pain Pain Assessment: 0-10 Pain Score: 2  Pain Location: "neuropathy pain" Pain Intervention(s): Limited activity within  patient's tolerance    Home Living Family/patient expects to be discharged to:: Private residence Living Arrangements: Other relatives Available Help at Discharge: Family;Available 24 hours/day Type of Home: House Home Access:  (2)     Home Layout: One level Home Equipment: None      Prior Function Level of Independence: Independent               Hand Dominance        Extremity/Trunk Assessment   Upper Extremity Assessment: Overall WFL for tasks assessed           Lower Extremity Assessment: Overall WFL for tasks assessed      Cervical / Trunk Assessment: Normal  Communication   Communication: No difficulties  Cognition Arousal/Alertness: Awake/alert Behavior During Therapy: WFL for tasks assessed/performed Overall Cognitive Status: Within Functional Limits for tasks assessed                      General Comments      Exercises        Assessment/Plan    PT Assessment Patient needs continued PT services  PT Diagnosis Difficulty walking   PT Problem List Decreased activity tolerance  PT Treatment Interventions Gait training;Functional mobility training;Therapeutic activities;Therapeutic exercise;Patient/family education   PT Goals (Current  goals can be found in the Care Plan section) Acute Rehab PT Goals Patient Stated Goal: Go home PT Goal Formulation: With patient/family Time For Goal Achievement: 12/12/14 Potential to Achieve Goals: Good    Frequency Min 3X/week   Barriers to discharge        Co-evaluation               End of Session Equipment Utilized During Treatment: Oxygen Activity Tolerance: Patient tolerated treatment well Patient left: in chair;with family/visitor present;with call bell/phone within reach;Other (comment) (with respiratory therapy) Nurse Communication: Mobility status (o2 sats)         Time: 3016-0109 PT Time Calculation (min) (ACUTE ONLY): 23 min   Charges:   PT Evaluation $Initial PT  Evaluation Tier I: 1 Procedure PT Treatments $Gait Training: 8-22 mins   PT G Codes:        Shelly Mosley 12/05/2014, 12:51 PM

## 2014-12-05 NOTE — Progress Notes (Signed)
SATURATION QUALIFICATIONS: (This note is used to comply with regulatory documentation for home oxygen)  Patient Saturations on Room Air at Rest = 93%  Patient Saturations on Room Air while Ambulating = 84%  Patient Saturations on 2 Liters of oxygen while Ambulating = 90%  Please briefly explain why patient needs home oxygen: Pt's o2 sats dropped to 84% with ambulation on room air.  Santiago Glad L. Tamala Julian, [place] Pager [phone] 12/05/2014

## 2014-12-05 NOTE — Progress Notes (Signed)
Patient ID: Shelly Mosley, female   DOB: 1945/11/20, 69 y.o.   MRN: [id number]  [facility] PROGRESS NOTE  Shelly Mosley [id number]:[id number] DOB: March 27, 1946 DOA: 12/02/2014 PCP: Dwan Bolt, MD   Brief narrative:    Patient is a 69 year old female with COPD not requiring oxygen, current smoker, hyperlipidemia, IBS, presented to [facility] emergency department with one week duration of progressively worsening dyspnea that initially started with exertion and has progressed to dyspnea at rest over the past 24 hours, associated with fevers of 102 F, chills, productive cough of clear and yellow sputum, sore throat, malaise and poor oral intake. Patient denies any specific alleviating or aggravating factors. Patient denies chest pain other than the one present with coughing spells, no abdominal or urinary concerns. Patient denies history of DVT or pulmonary embolus. She denies orthopnea, leg swelling. She does report recent exposure to sister who was sick with viral symptoms.  In emergency department, patient noted to be in mild distress due to dyspnea. Vital signs notable for temp 103.1 after, heart rate 108, respiratory rate 27 breaths per minute, blood pressure 74/62. Chest x-ray notable for right upper lobe consolidation concerning for pneumonia. [facility] asked to admit to step down unit for evaluation and management of presumptive sepsis secondary to pneumonia.  Assessment/Plan:    Active Problems:   Sepsis secondary to CAP (community acquired pneumonia) - criteria for sepsis met on admission: T103F, HR 108, RR 27, BP 74/62, WBC 15.5 - pt is clinically improving but continues to have mild expiratory wheezing on exam  - we'll continue vancomycin and Levaquin day #4/7 and stop vancomycin after today's dose  - continue to provide oxyge via , BD's  - WBC trending down - oxygen saturations < 90 with ambulation and thus pt will likely require home oxygen once ready for d/c Acute respiratory failure  with hypoxia, oxygen saturation 90% on room air - Secondary to pneumonia imposed on acute on chronic COPD exacerbation  - Antibiotics as noted above - continue bronchodilators scheduled and as needed - Still requiring oxygen via nasal cannula - continue to taper down solumedrol  - continue to provide Mucinex twice a day, antitussives as needed Acute on chronic COPD - management as noted above - continue to taper solumedrol down  CAP, lobar PNA, RUL, no known pathogen  - ABX as noted above - sputum culture, urine legionella and strep pneumo, negative to date  Anemia of chronic disease, iron deficiency  - Review of records indicate hemoglobin in 2014 ~ 10  - No signs of active bleeding  - Repeat CBC in the morning  Hypokalemia - now on high side this AM, will repeat BMP in AM Hypotension - in the setting of sepsis  - stabilizing  Morbid obesity  - Body mass index is 40.7  HLD - continue statin DVT prophylaxis - Lovenox SQ  Code Status: Full.  Family Communication:  plan of care discussed with the patient and sister at bedside  Disposition Plan: Home when stable.   Time spent 15 minutes   IV access:  Peripheral IV  Procedures and diagnostic studies:    Dg Chest Port 1 View  12/02/2014  RIGHT upper lobe consolidation compatible with pneumonia. Followup in 4-6 weeks to ensure radiographic clearing and exclude an underlying lesion is recommended.    Medical Consultants:  None   Other Consultants:  None   IAnti-Infectives:   Vanc 4/13 --> 4/16 Levaquin 4/13 -->  Faye Ramsay, MD  [facility] Pager [phone]  If 7PM-7AM, please contact night-coverage [email or web address] Password [email or web address] 12/05/2014, 7:47 PM   LOS: 3 days   HPI/Subjective: Still with productive cough   Objective: Filed Vitals:   12/04/14 2016 12/05/14 0544 12/05/14 0836 12/05/14 1335  BP: 123/54 138/71  117/55  Pulse: 81 58  72  Temp: 97.8 F (36.6 C) 97.7 F (36.5 C)  97.6 F (36.4 C)  TempSrc:  Oral Oral  Oral  Resp: 18 16  16   Height:      Weight:  125.51 kg (276 lb 11.2 oz)    SpO2: 96% 97% 95% 95%    Intake/Output Summary (Last 24 hours) at 12/05/14 1947 Last data filed at 12/05/14 1840  Gross per 24 hour  Intake   1340 ml  Output   2551 ml  Net  -1211 ml    Exam:   General:  Pt is alert, follows commands appropriately, not in acute distress  Cardiovascular: Regular rate and rhythm, S1/S2, no murmurs, no rubs, no gallops  Respiratory: Improved air movement bilaterally with exp wheezing   Abdomen: Soft, non tender, non distended, bowel sounds present, no guarding  Extremities: pulses DP and PT palpable bilaterally  Neuro: Grossly nonfocal  Data Reviewed: Basic Metabolic Panel:  Recent Labs Lab 12/02/14 0831 12/02/14 0845 12/03/14 0334 12/04/14 0450 12/05/14 0533  NA 135 137 140 138 138  K 2.7* 2.8* 4.3 4.4 5.4*  CL 102 102 111 108 107  CO2 21  --  21 24 24   GLUCOSE 126* 125* 129* 137* 105*  BUN 22 21 17  26* 23  CREATININE 0.92 0.70 0.60 0.64 0.64  CALCIUM 8.7  --  8.3* 8.8 8.7   Liver Function Tests:  Recent Labs Lab 12/02/14 0831  AST 34  ALT 28  ALKPHOS 108  BILITOT 1.2  PROT 7.4  ALBUMIN 2.9*   CBC:  Recent Labs Lab 12/02/14 0831 12/02/14 0845 12/03/14 0334 12/04/14 0450 12/05/14 0533  WBC 15.5*  --  17.8* 19.3* 14.3*  NEUTROABS 13.9*  --   --   --   --   HGB 11.8* 13.3 11.1* 10.7* 11.3*  HCT 35.2* 39.0 34.2* 33.7* 35.2*  MCV 90.7  --  93.2 93.6 95.1  PLT 277  --  273 331 322     Recent Results (from the past 240 hour(s))  Blood Culture (routine x 2)     Status: None (Preliminary result)   Collection Time: 12/02/14  8:31 AM  Result Value Ref Range Status   Specimen Description BLOOD RIGHT HAND  Final   Special Requests BOTTLES DRAWN AEROBIC AND ANAEROBIC 5ML  Final   Culture   Final           BLOOD CULTURE RECEIVED NO GROWTH TO DATE CULTURE WILL BE HELD FOR 5 DAYS BEFORE ISSUING A FINAL NEGATIVE REPORT Performed at  [facility]    Report Status PENDING  Incomplete  Blood Culture (routine x 2)     Status: None (Preliminary result)   Collection Time: 12/02/14  8:35 AM  Result Value Ref Range Status   Specimen Description BLOOD RIGHT FOREARM  Final   Special Requests BOTTLES DRAWN AEROBIC AND ANAEROBIC 5CC  Final   Culture   Final           BLOOD CULTURE RECEIVED NO GROWTH TO DATE CULTURE WILL BE HELD FOR 5 DAYS BEFORE ISSUING A FINAL NEGATIVE REPORT Performed at [facility]    Report Status PENDING  Incomplete  Urine culture  Status: None   Collection Time: 12/02/14  9:25 AM  Result Value Ref Range Status   Specimen Description URINE, CLEAN CATCH  Final   Special Requests NONE  Final   Report Status 12/03/2014 FINAL  Final  MRSA PCR Screening     Status: None   Collection Time: 12/02/14 10:55 AM  Result Value Ref Range Status   MRSA by PCR NEGATIVE NEGATIVE Final     Scheduled Meds: . aspirin  81 mg Oral q morning - 10a  . dextromethorphan-guaiFENesin  1 tablet Oral BID  . enoxaparin (LOVENOX) injection  60 mg Subcutaneous Q24H  . levalbuterol  1.25 mg Nebulization 4 times per day  . levofloxacin (LEVAQUIN) IV  750 mg Intravenous Q24H  . methylPREDNISolone (SOLU-MEDROL) injection  40 mg Intravenous Q12H  . omega-3 acid ethyl esters  2 g Oral BID  . pantoprazole  40 mg Oral Daily  . pravastatin  20 mg Oral q1800  . pregabalin  100 mg Oral TID  . thyroid  120 mg Oral QAC breakfast  . thyroid  30 mg Oral QAC breakfast  . vancomycin  1,000 mg Intravenous Q12H  . venlafaxine  62.5 mg Oral q morning - 10a   Continuous Infusions:

## 2014-12-06 LAB — CBC
HCT: 35.4 % — ABNORMAL LOW (ref 36.0–46.0)
Hemoglobin: 11.1 g/dL — ABNORMAL LOW (ref 12.0–15.0)
MCH: 29.9 pg (ref 26.0–34.0)
MCHC: 31.4 g/dL (ref 30.0–36.0)
MCV: 95.4 fL (ref 78.0–100.0)
Platelets: 376 10*3/uL (ref 150–400)
RBC: 3.71 MIL/uL — AB (ref 3.87–5.11)
RDW: 14.4 % (ref 11.5–15.5)
WBC: 11.2 10*3/uL — AB (ref 4.0–10.5)

## 2014-12-06 LAB — BASIC METABOLIC PANEL
Anion gap: 4 — ABNORMAL LOW (ref 5–15)
BUN: 19 mg/dL (ref 6–23)
CO2: 30 mmol/L (ref 19–32)
Calcium: 8.5 mg/dL (ref 8.4–10.5)
Chloride: 105 mmol/L (ref 96–112)
Creatinine, Ser: 0.64 mg/dL (ref 0.50–1.10)
GFR calc Af Amer: >90 mL/min (ref >90)
GFR, EST NON AFRICAN AMERICAN: 90 mL/min — AB (ref >90)
GLUCOSE: 87 mg/dL (ref 70–99)
Potassium: 4.3 mmol/L (ref 3.5–5.1)
Sodium: 139 mmol/L (ref 135–145)

## 2014-12-06 MED ORDER — LEVOFLOXACIN 750 MG PO TABS
750.0000 mg | ORAL_TABLET | Freq: Every day | ORAL | Status: DC
  Administered 2014-12-07: 750 mg via ORAL
  Filled 2014-12-06: qty 1

## 2014-12-06 MED ORDER — LEVALBUTEROL HCL 0.63 MG/3ML IN NEBU
0.6300 mg | INHALATION_SOLUTION | Freq: Two times a day (BID) | RESPIRATORY_TRACT | Status: DC
  Administered 2014-12-07: 0.63 mg via RESPIRATORY_TRACT
  Filled 2014-12-06 (×2): qty 3
  Filled 2014-12-06: qty 0.26

## 2014-12-06 MED ORDER — PREDNISONE 50 MG PO TABS
50.0000 mg | ORAL_TABLET | Freq: Every day | ORAL | Status: DC
  Administered 2014-12-07: 50 mg via ORAL
  Filled 2014-12-06: qty 1

## 2014-12-06 NOTE — Progress Notes (Signed)
Patient ID: Shelly Mosley, female   DOB: April 27, 1946, 69 y.o.   MRN: [id number]  [facility] PROGRESS NOTE  ADDISON FREIMUTH FHL:[id number] DOB: [birth year] DOA: 12/02/2014 PCP: Dwan Bolt, MD   Brief narrative:    Patient is a 69 year old female with COPD not requiring oxygen, current smoker, hyperlipidemia, IBS, presented to [facility] emergency department with one week duration of progressively worsening dyspnea that initially started with exertion and has progressed to dyspnea at rest over the past 24 hours, associated with fevers of 102 F, chills, productive cough of clear and yellow sputum, sore throat, malaise and poor oral intake. Patient denies any specific alleviating or aggravating factors. Patient denies chest pain other than the one present with coughing spells, no abdominal or urinary concerns. Patient denies history of DVT or pulmonary embolus. She denies orthopnea, leg swelling. She does report recent exposure to sister who was sick with viral symptoms.  In emergency department, patient noted to be in mild distress due to dyspnea. Vital signs notable for temp 103.1 after, heart rate 108, respiratory rate 27 breaths per minute, blood pressure 74/62. Chest x-ray notable for right upper lobe consolidation concerning for pneumonia. [facility] asked to admit to step down unit for evaluation and management of presumptive sepsis secondary to pneumonia.  Assessment/Plan:    Active Problems:   Sepsis secondary to CAP (community acquired pneumonia) - criteria for sepsis met on admission: T103F, HR 108, RR 27, BP 74/62, WBC 15.5 - pt is clinically improving, minimal exp wheezing still noted in the lower lobes  - continued Vancomycin for 4 days and currently on Levaquin day #5 - continue to provide oxyge via [place], BD's  - WBC trending down - oxygen saturations < 90 with ambulation and thus pt will likely require home oxygen once ready for d/c Acute respiratory failure with hypoxia, oxygen  saturation 90% on room air - Secondary to pneumonia imposed on acute on chronic COPD exacerbation  - Antibiotics as noted above - continue bronchodilators scheduled and as needed - Still requiring oxygen via nasal cannula - continue to taper down solumedrol with possible transition to Prednisone in AM - continue to provide Mucinex twice a day, antitussives as needed Acute on chronic COPD - management as noted above - continue to taper solumedrol down and transition to Prednisone in AM CAP, lobar PNA, RUL, no known pathogen  - ABX as noted above - sputum culture, urine legionella and strep pneumo, negative to date  Anemia of chronic disease, iron deficiency  - Review of records indicate hemoglobin in 2014 ~ 10  - No signs of active bleeding  - Repeat CBC in the morning  Hypokalemia - supplemented and WNL this AM Hypotension - in the setting of sepsis  - now stable and at target range  Morbid obesity  - Body mass index is 40.7  HLD - continue statin DVT prophylaxis - Lovenox SQ  Code Status: Full.  Family Communication:  plan of care discussed with the patient and sister at bedside  Disposition Plan: Home possibly in AM if continues improving and able to transition to oral Prednisone   Time spent 15 minutes   IV access:  Peripheral IV  Procedures and diagnostic studies:    Dg Chest Port 1 View  12/02/2014  RIGHT upper lobe consolidation compatible with pneumonia. Followup in 4-6 weeks to ensure radiographic clearing and exclude an underlying lesion is recommended.    Medical Consultants:  None   Other Consultants:  None  IAnti-Infectives:   Vanc 4/13 --> 4/16 Levaquin 4/13 -->  Faye Ramsay, MD  [facility] Pager [phone]  If 7PM-7AM, please contact night-coverage [email or web address] Password [email or web address] 12/06/2014, 1:35 PM   LOS: 4 days   HPI/Subjective: Still with productive cough and exertional dyspnea but says feels overall better.   Objective: Filed Vitals:    12/05/14 1335 12/05/14 2107 12/06/14 0420 12/06/14 1316  BP: 117/55 113/54 159/72 133/57  Pulse: 72 73 77 78  Temp: 97.6 F (36.4 C) 97.6 F (36.4 C) 97.7 F (36.5 C) 97.8 F (36.6 C)  TempSrc: Oral Oral Oral Oral  Resp: _0 Height:      Weight:   125.329 kg (276 lb 4.8 oz)   SpO2: 95% 97% 96% 96%    Intake/Output Summary (Last 24 hours) at 12/06/14 1335 Last data filed at 12/06/14 1230  Gross per 24 hour  Intake    920 ml  Output   3151 ml  Net  -2231 ml    Exam:   General:  Pt is alert, follows commands appropriately, not in acute distress  Cardiovascular: Regular rate and rhythm, S1/S2, no murmurs, no rubs, no gallops  Respiratory: Improved air movement bilaterally with mild rhonchi on the right side and minimal end expiratory wheezing in the lower lobes    Abdomen: Soft, non tender, non distended, bowel sounds present, no guarding  Extremities: pulses DP and PT palpable bilaterally  Neuro: Grossly nonfocal  Data Reviewed: Basic Metabolic Panel:  Recent Labs Lab 12/02/14 0831 12/02/14 0845 12/03/14 0334 12/04/14 0450 12/05/14 0533 12/06/14 0531  NA 135 137 140 138 138 139  K 2.7* 2.8* 4.3 4.4 5.4* 4.3  CL 102 102 111 108 107 105  CO2 21  --  _1 GLUCOSE 126* 125* 129* 137* 105* 87  BUN _2 26* 23 19  CREATININE 0.92 0.70 0.60 0.64 0.64 0.64  CALCIUM 8.7  --  8.3* 8.8 8.7 8.5   Liver Function Tests:  Recent Labs Lab 12/02/14 0831  AST 34  ALT 28  ALKPHOS 108  BILITOT 1.2  PROT 7.4  ALBUMIN 2.9*   CBC:  Recent Labs Lab 12/02/14 0831 12/02/14 0845 12/03/14 0334 12/04/14 0450 12/05/14 0533 12/06/14 0531  WBC 15.5*  --  17.8* 19.3* 14.3* 11.2*  NEUTROABS 13.9*  --   --   --   --   --   HGB 11.8* 13.3 11.1* 10.7* 11.3* 11.1*  HCT 35.2* 39.0 34.2* 33.7* 35.2* 35.4*  MCV 90.7  --  93.2 93.6 95.1 95.4  PLT 277  --  273 331 322 376     Recent Results (from the past 240 hour(s))  Blood Culture (routine x 2)      Status: None (Preliminary result)   Collection Time: 12/02/14  8:31 AM  Result Value Ref Range Status   Specimen Description BLOOD RIGHT HAND  Final   Special Requests BOTTLES DRAWN AEROBIC AND ANAEROBIC 5ML  Final   Culture   Final           BLOOD CULTURE RECEIVED NO GROWTH TO DATE CULTURE WILL BE HELD FOR 5 DAYS BEFORE ISSUING A FINAL NEGATIVE REPORT Performed at [facility]    Report Status PENDING  Incomplete  Blood Culture (routine x 2)     Status: None (Preliminary result)   Collection Time: 12/02/14  8:35 AM  Result Value Ref Range Status   Specimen Description BLOOD RIGHT FOREARM  Final  Special Requests BOTTLES DRAWN AEROBIC AND ANAEROBIC 5CC  Final   Culture   Final           BLOOD CULTURE RECEIVED NO GROWTH TO DATE CULTURE WILL BE HELD FOR 5 DAYS BEFORE ISSUING A FINAL NEGATIVE REPORT Performed at [facility]    Report Status PENDING  Incomplete  Urine culture     Status: None   Collection Time: 12/02/14  9:25 AM  Result Value Ref Range Status   Specimen Description URINE, CLEAN CATCH  Final   Special Requests NONE  Final   Report Status 12/03/2014 FINAL  Final  MRSA PCR Screening     Status: None   Collection Time: 12/02/14 10:55 AM  Result Value Ref Range Status   MRSA by PCR NEGATIVE NEGATIVE Final     Scheduled Meds: . aspirin  81 mg Oral q morning - 10a  . dextromethorphan-guaiFENesin  1 tablet Oral BID  . enoxaparin (LOVENOX) injection  60 mg Subcutaneous Q24H  . levalbuterol  0.63 mg Nebulization TID  . levofloxacin (LEVAQUIN) IV  750 mg Intravenous Q24H  . methylPREDNISolone (SOLU-MEDROL) injection  40 mg Intravenous Daily  . omega-3 acid ethyl esters  2 g Oral BID  . pantoprazole  40 mg Oral Daily  . pravastatin  20 mg Oral q1800  . pregabalin  100 mg Oral TID  . thyroid  120 mg Oral QAC breakfast  . thyroid  30 mg Oral QAC breakfast  . venlafaxine  62.5 mg Oral q morning - 10a   Continuous Infusions:

## 2014-12-06 NOTE — Progress Notes (Signed)
PT demonstrated verbal and hands on understanding of Flutter device. 

## 2014-12-07 LAB — BASIC METABOLIC PANEL
Anion gap: 5 (ref 5–15)
BUN: 17 mg/dL (ref 6–23)
CALCIUM: 8.3 mg/dL — AB (ref 8.4–10.5)
CO2: 30 mmol/L (ref 19–32)
Chloride: 101 mmol/L (ref 96–112)
Creatinine, Ser: 0.6 mg/dL (ref 0.50–1.10)
GFR calc Af Amer: >90 mL/min (ref >90)
GFR calc non Af Amer: >90 mL/min (ref >90)
GLUCOSE: 73 mg/dL (ref 70–99)
Potassium: 4.2 mmol/L (ref 3.5–5.1)
Sodium: 136 mmol/L (ref 135–145)

## 2014-12-07 LAB — CBC
HCT: 36.4 % (ref 36.0–46.0)
HEMOGLOBIN: 11.8 g/dL — AB (ref 12.0–15.0)
MCH: 30.6 pg (ref 26.0–34.0)
MCHC: 32.4 g/dL (ref 30.0–36.0)
MCV: 94.3 fL (ref 78.0–100.0)
Platelets: 370 10*3/uL (ref 150–400)
RBC: 3.86 MIL/uL — ABNORMAL LOW (ref 3.87–5.11)
RDW: 14.1 % (ref 11.5–15.5)
WBC: 12.7 10*3/uL — ABNORMAL HIGH (ref 4.0–10.5)

## 2014-12-07 MED ORDER — HYDROCODONE-ACETAMINOPHEN 5-325 MG PO TABS: 1.0000 | ORAL_TABLET | ORAL | Status: DC | PRN

## 2014-12-07 MED ORDER — LEVOFLOXACIN 750 MG PO TABS: 750.0000 mg | ORAL_TABLET | Freq: Every day | ORAL | Status: DC

## 2014-12-07 MED ORDER — PREDNISONE 10 MG PO TABS: ORAL_TABLET | ORAL | Status: DC

## 2014-12-07 MED ORDER — DM-GUAIFENESIN ER 30-600 MG PO TB12: 1.0000 | ORAL_TABLET | Freq: Two times a day (BID) | ORAL | Status: DC

## 2014-12-07 MED ORDER — LEVALBUTEROL HCL 0.63 MG/3ML IN NEBU: 0.6300 mg | INHALATION_SOLUTION | Freq: Four times a day (QID) | RESPIRATORY_TRACT | Status: DC | PRN

## 2014-12-07 MED ORDER — GUAIFENESIN 100 MG/5ML PO SOLN: 10.0000 mL | ORAL | Status: DC | PRN

## 2014-12-07 NOTE — Progress Notes (Signed)
SATURATION QUALIFICATIONS: (This note is used to comply with regulatory documentation for home oxygen)  Patient Saturations on Room Air at Rest = 89%  Patient Saturations on Room Air while Ambulating = 87% Shelly Mosley, [name]

## 2014-12-07 NOTE — Care Management Note (Signed)
    Page 1 of 2   12/07/2014     10:29:09 AM CARE MANAGEMENT NOTE 12/07/2014  Patient:  Shelly Mosley, Shelly Mosley   Account Number:  [id number]  Date Initiated:  12/02/2014  Documentation initiated by:  DAVIS,RHONDA  Subjective/Objective Assessment:   sepsis     Action/Plan:   home when stable   Anticipated DC Date:  12/07/2014   Anticipated DC Plan:  HOME/SELF CARE  In-house referral  NA      DC Planning Services  CM consult      Choice offered to / List presented to:  C-1 Patient   DME arranged  [employer]  NEBULIZER MACHINE      DME agency  [place].        Status of service:  Completed, signed off Medicare Important Message given?  YES (If response is "NO", the following Medicare IM given date fields will be blank) Date Medicare IM given:  12/07/2014 Medicare IM given by:  [facility] Date Additional Medicare IM given:   Additional Medicare IM given by:    Discharge Disposition:  HOME/SELF CARE  Per UR Regulation:  Reviewed for med. necessity/level of care/duration of stay  If discussed at [place] of Stay Meetings, dates discussed:    Comments:  12/07/14 Dessa Phi RN BSN NCM [phone] Ordered for Home nebs, & qualifies for home 02, home 02 ordered. [facility] dem rep [place] aware of orders, & d/c.No further d/c needs.  December 02, 2014/Rhonda L. Rosana Hoes, RN, BSN, CCM. Case Management [place] [phone] No discharge needs present of time of review.

## 2014-12-07 NOTE — Discharge Instructions (Signed)

## 2014-12-07 NOTE — Discharge Summary (Signed)
Physician Discharge Summary  Shelly Mosley YQM:[id number] DOB: 10-17-1945 DOA: 12/02/2014  PCP: Dwan Bolt, MD  Admit date: 12/02/2014 Discharge date: 12/07/2014  Recommendations for Outpatient Follow-up:  1. Pt will need to follow up with PCP in 2-3 weeks post discharge 2. Please obtain BMP to evaluate electrolytes and kidney function 3. Please also check CBC to evaluate Hg and Hct levels 4. Please note that pt was discharge on Levaquin to complete therapy for PNA 5. Due to extent of the PNA, it was recommended to repeat CXR in 4-6 weeks post discharge to ensure resolution 6. In addition, pt was started on Levalbuterol neb as she reported non tolerance to albuterol inhalers 7. I left albuterol inhale on med list but stopped combivent inhaler 8. Pt discharged home on oxygen due to persistent desaturation to high 80's with ambulation  9. Pt also discharged on Prednisone taper pack as noted below   Discharge Diagnoses:  Active Problems:   Sepsis   CAP (community acquired pneumonia)  Discharge Condition: Stable  Diet recommendation: Heart healthy diet discussed in details    Brief narrative:    Patient is a 69 year old female with COPD not requiring oxygen, current smoker, hyperlipidemia, IBS, presented to [facility] emergency department with one week duration of progressively worsening dyspnea that initially started with exertion and has progressed to dyspnea at rest over the past 24 hours, associated with fevers of 102 F, chills, productive cough of clear and yellow sputum, sore throat, malaise and poor oral intake. Patient denies any specific alleviating or aggravating factors. Patient denies chest pain other than the one present with coughing spells, no abdominal or urinary concerns. Patient denies history of DVT or pulmonary embolus. She denies orthopnea, leg swelling. She does report recent exposure to sister who was sick with viral symptoms.  In emergency department,  patient noted to be in mild distress due to dyspnea. Vital signs notable for temp 103.1 after, heart rate 108, respiratory rate 27 breaths per minute, blood pressure 74/62. Chest x-ray notable for right upper lobe consolidation concerning for pneumonia. TRH asked to admit to step down unit for evaluation and management of presumptive sepsis secondary to pneumonia.  Assessment/Plan:    Active Problems:  Sepsis secondary to CAP (community acquired pneumonia) - criteria for sepsis met on admission: T103F, HR 108, RR 27, BP 74/62, WBC 15.5 - pt is clinically improving, no wheezing noted on exam this AM  - continued Vancomycin for 4 days and currently on Levaquin day #6 which pt will need to continue upon discharge to complete therapy  - continue to provide oxyge via [place], BD's  - oxygen saturations < 90 with ambulation and thus pt will likely require home oxygen once ready for d/c Acute respiratory failure with hypoxia, oxygen saturation 90% on room air - Secondary to pneumonia imposed on acute on chronic COPD exacerbation  - Antibiotics as noted above - continue bronchodilators scheduled and as needed - Still requiring oxygen via nasal cannula - continue to taper down Prednisone upon discharge  Acute on chronic COPD - management as noted above - continue to taper down Prednisone  CAP, lobar PNA, RUL, no known pathogen  - ABX as noted above - sputum culture, urine legionella and strep pneumo, negative to date  Anemia of chronic disease, iron deficiency  - Review of records indicate hemoglobin in 2014 ~ 10  - No signs of active bleeding  Hypokalemia - supplemented and WNL this AM Hypotension - in the setting of sepsis  -  BP now stable and at target range  Morbid obesity  - Body mass index is 40.7  HLD - continue statin DVT prophylaxis - Lovenox SQ provided while inpatient   Code Status: Full.  Family Communication: plan of care discussed with the patient and sister at  bedside  Disposition Plan: Pt wants to go home today   IV access:  Peripheral IV  Procedures and diagnostic studies:   Dg Chest Port 1 View 12/02/2014 RIGHT upper lobe consolidation compatible with pneumonia. Followup in 4-6 weeks to ensure radiographic clearing and exclude an underlying lesion is recommended.   Medical Consultants:  None   Other Consultants:  None   IAnti-Infectives:   Vanc 4/13 --> 4/16 Levaquin 4/13 -->       Discharge Exam: Filed Vitals:   12/07/14 0559  BP: 122/68  Pulse: 58  Temp: 97.6 F (36.4 C)  Resp: 16   Filed Vitals:   12/06/14 1316 12/06/14 2001 12/07/14 0559 12/07/14 0925  BP: 133/57 132/78 122/68   Pulse: 78 77 58   Temp: 97.8 F (36.6 C) 97.8 F (36.6 C) 97.6 F (36.4 C)   TempSrc: Oral Oral Oral   Resp: _0 Height:      Weight:   123.378 kg (272 lb)   SpO2: 96% 99% 96% 96%    General: Pt is alert, follows commands appropriately, not in acute distress Cardiovascular: Regular rate and rhythm, no rubs, no gallops Respiratory: Clear to auscultation bilaterally, no wheezing, rhonchi at bases and diminished breath sounds at bases  Abdominal: Soft, non tender, non distended, bowel sounds +, no guarding Extremities: no cyanosis, pulses palpable bilaterally DP and PT Neuro: Grossly nonfocal  Discharge Instructions     Medication List    STOP taking these medications        Ipratropium-Albuterol 20-100 MCG/ACT Aers respimat  Commonly known as:  COMBIVENT RESPIMAT      TAKE these medications        acetaminophen 500 MG tablet  Commonly known as:  TYLENOL  Take 500 mg by mouth every 6 (six) hours as needed for mild pain or headache.     albuterol 108 (90 BASE) MCG/ACT inhaler  Commonly known as:  PROAIR HFA  Inhale 2 puffs into the lungs 3 (three) times daily.     ARMOUR THYROID 30 MG tablet  Generic drug:  thyroid  Take 1 tablet by mouth daily. Take along with Thyroid 19m for a total of  1526mdaily     ARMOUR THYROID 120 MG tablet  Generic drug:  thyroid  Take 1 tablet by mouth daily. Along with Thyroid 3085mor total of 150m63mce a day     aspirin 81 MG tablet  Take 81 mg by mouth every morning.     calcium carbonate 500 MG chewable tablet  Commonly known as:  TUMS - dosed in mg elemental calcium  Chew 2 tablets by mouth at bedtime as needed for indigestion or heartburn.     CALCIUM CITRATE + D PO  Take 1 tablet by mouth 2 (two) times daily.     cholecalciferol 1000 UNITS tablet  Commonly known as:  VITAMIN D  Take 1,000 Units by mouth 2 (two) times daily.     dextromethorphan-guaiFENesin 30-600 MG per 12 hr tablet  Commonly known as:  MUCINEX DM  Take 1 tablet by mouth 2 (two) times daily.     dicyclomine 10 MG capsule  Commonly known as:  BENTYL  Take 10 mg by mouth daily as needed for spasms (irritable bowel).     estradiol 0.05 MG/24HR patch  Commonly known as:  VIVELLE-DOT  PLACE 1 PATCH (0.05 MG TOTAL) ONTO THE SKIN 2 (TWO) TIMES A WEEK.     furosemide 80 MG tablet  Commonly known as:  LASIX  Take 40 mg by mouth 2 (two) times daily.     GENTEAL 0.25-0.3 % Gel  Generic drug:  Carboxymethylcell-Hypromellose  Apply 1 application to eye 2 (two) times daily as needed (dry eyes).     guaiFENesin 100 MG/5ML Soln  Commonly known as:  ROBITUSSIN  Take 10 mLs (200 mg total) by mouth every 4 (four) hours as needed for cough or to loosen phlegm.     HYDROcodone-acetaminophen 5-325 MG per tablet  Commonly known as:  NORCO/VICODIN  Take 1-2 tablets by mouth every 4 (four) hours as needed for moderate pain or severe pain.     hydroxypropyl cellulose 5 MG Inst  Place 5 mg into both eyes at bedtime.     levalbuterol 0.63 MG/3ML nebulizer solution  Commonly known as:  XOPENEX  Take 3 mLs (0.63 mg total) by nebulization every 6 (six) hours as needed for wheezing or shortness of breath.     levofloxacin 750 MG tablet  Commonly known as:  LEVAQUIN  Take 1  tablet (750 mg total) by mouth daily.     LIVALO 2 MG Tabs  Generic drug:  Pitavastatin Calcium  Take 2 mg by mouth every other day.     LORazepam 0.5 MG tablet  Commonly known as:  ATIVAN  Take 0.5 mg by mouth every 8 (eight) hours as needed for anxiety.     losartan 50 MG tablet  Commonly known as:  COZAAR  Take 50 mg by mouth 2 (two) times daily.     Medical Compression Stockings Misc  1 application by Does not apply route daily.     multivitamin tablet  Take 1 tablet by mouth daily.     omega-3 acid ethyl esters 1 G capsule  Commonly known as:  LOVAZA  Take 2 g by mouth 2 (two) times daily.     omeprazole 10 MG capsule  Commonly known as:  PRILOSEC  Take 10 mg by mouth daily as needed (heart burn).     predniSONE 10 MG tablet  Commonly known as:  DELTASONE  Take 60 mg tablet tomorrow 4/19 and taper down by 10 mg daily until completed  Notes to Patient:  Take as directed     pregabalin 100 MG capsule  Commonly known as:  LYRICA  Take 100 mg by mouth 3 (three) times daily.     traMADol 50 MG tablet  Commonly known as:  ULTRAM  Take 1-2 tablets (50-100 mg total) by mouth every 6 (six) hours as needed (moderate to severe pain).     venlafaxine 25 MG tablet  Commonly known as:  EFFEXOR  Take 62.5 mg by mouth every morning.            Follow-up Information    Follow up with [facility].   Why:  nebulizer machine, oxygen   Contact information:   [place] [address] [phone]       Follow up with Dwan Bolt, MD.   Specialty:  Endocrinology   Contact information:   [address] [place] [address] [phone]       Follow up with  Faye Ramsay, MD.   Specialty:  Internal Medicine   Why:  As needed call my cell phone [phone]   Contact information:   [address] [place] [address] [phone]        The results of significant diagnostics  from this hospitalization (including imaging, microbiology, ancillary and laboratory) are listed below for reference.     Microbiology: Recent Results (from the past 240 hour(s))  Blood Culture (routine x 2)     Status: None (Preliminary result)   Collection Time: 12/02/14  8:31 AM  Result Value Ref Range Status   Specimen Description BLOOD RIGHT HAND  Final   Special Requests BOTTLES DRAWN AEROBIC AND ANAEROBIC 5ML  Final   Culture   Final           BLOOD CULTURE RECEIVED NO GROWTH TO DATE CULTURE WILL BE HELD FOR 5 DAYS BEFORE ISSUING A FINAL NEGATIVE REPORT Performed at [facility]    Report Status PENDING  Incomplete  Blood Culture (routine x 2)     Status: None (Preliminary result)   Collection Time: 12/02/14  8:35 AM  Result Value Ref Range Status   Specimen Description BLOOD RIGHT FOREARM  Final   Special Requests BOTTLES DRAWN AEROBIC AND ANAEROBIC 5CC  Final   Culture   Final           BLOOD CULTURE RECEIVED NO GROWTH TO DATE CULTURE WILL BE HELD FOR 5 DAYS BEFORE ISSUING A FINAL NEGATIVE REPORT Performed at [facility]    Report Status PENDING  Incomplete  Urine culture     Status: None   Collection Time: 12/02/14  9:25 AM  Result Value Ref Range Status   Specimen Description URINE, CLEAN CATCH  Final   Special Requests NONE  Final   Colony Count NO GROWTH Performed at [facility]   Final   Culture NO GROWTH Performed at [facility]   Final   Report Status 12/03/2014 FINAL  Final  MRSA PCR Screening     Status: None   Collection Time: 12/02/14 10:55 AM  Result Value Ref Range Status   MRSA by PCR NEGATIVE NEGATIVE Final    Comment:        The GeneXpert MRSA Assay (FDA approved for NASAL specimens only), is one component of a comprehensive MRSA colonization surveillance program. It is not intended to diagnose MRSA infection nor to guide or monitor treatment for MRSA infections.      Labs: Basic Metabolic  Panel:  Recent Labs Lab 12/03/14 0334 12/04/14 0450 12/05/14 0533 12/06/14 0531 12/07/14 0529  NA 140 138 138 139 136  K 4.3 4.4 5.4* 4.3 4.2  CL 111 108 107 105 101  CO2 _0 GLUCOSE 129* 137* 105* 87 73  BUN 17 26* _1 CREATININE 0.60 0.64 0.64 0.64 0.60  CALCIUM 8.3* 8.8 8.7 8.5 8.3*   Liver Function Tests:  Recent Labs Lab 12/02/14 0831  AST 34  ALT 28  ALKPHOS 108  BILITOT 1.2  PROT 7.4  ALBUMIN 2.9*   CBC:  Recent Labs Lab 12/02/14 0831  12/03/14 0334 12/04/14 0450 12/05/14 0533 12/06/14 0531 12/07/14 0529  WBC 15.5*  --  17.8* 19.3* 14.3* 11.2* 12.7*  NEUTROABS 13.9*  --   --   --   --   --   --   HGB 11.8*  < > 11.1* 10.7* 11.3* 11.1* 11.8*  HCT 35.2*  < >  34.2* 33.7* 35.2* 35.4* 36.4  MCV 90.7  --  93.2 93.6 95.1 95.4 94.3  PLT 277  --  273 331 322 376 370  < > = values in this interval not displayed.  BNP (last 3 results)  Recent Labs  12/02/14 0831  BNP 131.7*   SIGNED: Time coordinating discharge: 30 minutes  Faye Ramsay, MD  [facility] 12/07/2014, 11:03 AM Pager [phone]  If 7PM-7AM, please contact night-coverage [email or web address] Password [email or web address]

## 2014-12-08 LAB — CULTURE, BLOOD (ROUTINE X 2)
CULTURE: NO GROWTH
Culture: NO GROWTH

## 2014-12-21 ENCOUNTER — Other Ambulatory Visit: Payer: Self-pay | Admitting: Internal Medicine

## 2014-12-23 DIAGNOSIS — J9690 Respiratory failure, unspecified, unspecified whether with hypoxia or hypercapnia: Secondary | ICD-10-CM | POA: Diagnosis not present

## 2014-12-25 DIAGNOSIS — I10 Essential (primary) hypertension: Secondary | ICD-10-CM | POA: Diagnosis not present

## 2014-12-25 DIAGNOSIS — I4891 Unspecified atrial fibrillation: Secondary | ICD-10-CM | POA: Diagnosis not present

## 2014-12-28 ENCOUNTER — Other Ambulatory Visit: Payer: Self-pay | Admitting: Gynecologic Oncology

## 2014-12-28 ENCOUNTER — Telehealth: Payer: Self-pay | Admitting: *Deleted

## 2014-12-28 DIAGNOSIS — C541 Malignant neoplasm of endometrium: Secondary | ICD-10-CM

## 2014-12-28 DIAGNOSIS — R188 Other ascites: Secondary | ICD-10-CM

## 2014-12-28 NOTE — Progress Notes (Signed)
CT per Dr. Denman George

## 2014-12-30 ENCOUNTER — Telehealth: Payer: Self-pay | Admitting: *Deleted

## 2014-12-30 NOTE — Telephone Encounter (Signed)
error 

## 2014-12-30 NOTE — Telephone Encounter (Signed)
Pt notified of future CT scan appointment. Pt is scheduled for CT scan on 03/30/2015 @  10:30. Pt was advised to pick up contrast and instructions at [place] office prior to appt. Pt agreed with time and date of CT scan

## 2015-01-26 ENCOUNTER — Encounter: Payer: Self-pay | Admitting: Pulmonary Disease

## 2015-01-26 ENCOUNTER — Ambulatory Visit (INDEPENDENT_AMBULATORY_CARE_PROVIDER_SITE_OTHER): Payer: Medicare Other | Admitting: Pulmonary Disease

## 2015-01-26 ENCOUNTER — Ambulatory Visit (INDEPENDENT_AMBULATORY_CARE_PROVIDER_SITE_OTHER)
Admission: RE | Admit: 2015-01-26 | Discharge: 2015-01-26 | Disposition: A | Discharge status: Home / Self Care | Payer: Medicare Other | Source: Ambulatory Visit | Attending: Pulmonary Disease | Admitting: Pulmonary Disease

## 2015-01-26 VITALS — BP 118/76 | HR 85 | Ht 66.5 in | Wt 265.0 lb

## 2015-01-26 DIAGNOSIS — R05 Cough: Secondary | ICD-10-CM | POA: Diagnosis not present

## 2015-01-26 DIAGNOSIS — J189 Pneumonia, unspecified organism: Secondary | ICD-10-CM | POA: Diagnosis not present

## 2015-01-26 DIAGNOSIS — R0602 Shortness of breath: Secondary | ICD-10-CM | POA: Diagnosis not present

## 2015-01-26 DIAGNOSIS — J432 Centrilobular emphysema: Secondary | ICD-10-CM | POA: Diagnosis not present

## 2015-01-26 MED ORDER — IPRATROPIUM-ALBUTEROL 0.5-2.5 (3) MG/3ML IN SOLN: 3.0000 mL | Freq: Four times a day (QID) | RESPIRATORY_TRACT | Status: DC
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
## 2015-01-26 NOTE — Assessment & Plan Note (Signed)
Her COPD is stable and that she's not wheezing or she have chest tightness. She says that the Xopenex did not help very much.  Plan: Change Xopenex to duo neb 4 times a day, I told her to use it a minimum of twice a day for the next 2 months and up to 4 times a day as needed for shortness of breath

## 2015-01-26 NOTE — Assessment & Plan Note (Addendum)
She was just hospitalized for community-acquired pneumonia. I've personally reviewed the chest x-ray images which showed a right upper lobe infiltrate which was initially dense on April 13 and then started to clear by April 16, and discharge summary notes that she was treated successfully with antibodies. She was discharged home on oxygen. She seems to be doing well from a clinical standpoint though she still has some fatigue and weakness related to the hospital stay. This is in keeping with an episode of severe community-acquired pneumonia and an adult with COPD.  Plan: Gradually increase regular exercise Chest x-ray to ensure complete clearing of infiltrate Ambulatory oximetry testing today in clinic to help [place] sort out what to do with her oxygen, for now I prefer for her to keep it at home and she says it helps her when she does her exercises.

## 2015-01-26 NOTE — Patient Instructions (Signed)
We will call you with the results of the chest x-ray Keep using your oxygen as you're doing Use the DuoNeb twice a day minimum for the next 2 months, you can use it up to 4 times a day Gradually increase your physical activity over the next 2 months We will see you back in 2 months

## 2015-02-02 ENCOUNTER — Other Ambulatory Visit: Payer: Self-pay | Admitting: Pulmonary Disease

## 2015-02-05 ENCOUNTER — Telehealth: Payer: Self-pay | Admitting: Pulmonary Disease

## 2015-02-05 MED ORDER — IPRATROPIUM-ALBUTEROL 0.5-2.5 (3) MG/3ML IN SOLN: 3.0000 mL | Freq: Four times a day (QID) | RESPIRATORY_TRACT | Status: DC

## 2015-02-05 NOTE — Telephone Encounter (Signed)
Fax has been received, only needs a dx code on rx.  This rx has been e-scribed again with dx code.  Nothing further needed.

## 2015-02-05 NOTE — Telephone Encounter (Signed)
LMTCB x1 for pt.  Called pharmacy. They are needing to fax paper to get filled out for Dx code for neb solution. Will await fax.

## 2015-02-08 ENCOUNTER — Telehealth: Payer: Self-pay | Admitting: Pulmonary Disease

## 2015-02-08 MED ORDER — IPRATROPIUM-ALBUTEROL 0.5-2.5 (3) MG/3ML IN SOLN: 3.0000 mL | Freq: Four times a day (QID) | RESPIRATORY_TRACT | Status: DC

## 2015-02-08 NOTE — Telephone Encounter (Signed)
duoneb resent with ICD 10 code.  Pt aware.  Nothing further needed.

## 2015-03-09 ENCOUNTER — Telehealth: Payer: Self-pay | Admitting: Nurse Practitioner

## 2015-03-09 NOTE — Telephone Encounter (Signed)
Patient calling for clarification on how to pick up contrast for CT scan on 03/30/15. She is informed to notify front desk at [facility] that she is here to pick up contrast for upcoming scan, drinking instructions will be enclosed. She can get this from either gyn onc RN or scheduling department anytime until the day before scan. She verbalizes understanding and states she will likely pick up Thursday.

## 2015-03-11 DIAGNOSIS — Z1231 Encounter for screening mammogram for malignant neoplasm of breast: Secondary | ICD-10-CM | POA: Diagnosis not present

## 2015-03-11 DIAGNOSIS — Z803 Family history of malignant neoplasm of breast: Secondary | ICD-10-CM | POA: Diagnosis not present

## 2015-03-15 DIAGNOSIS — Z0189 Encounter for other specified special examinations: Secondary | ICD-10-CM | POA: Diagnosis not present

## 2015-03-15 DIAGNOSIS — N63 Unspecified lump in breast: Secondary | ICD-10-CM | POA: Diagnosis not present

## 2015-03-15 DIAGNOSIS — R921 Mammographic calcification found on diagnostic imaging of breast: Secondary | ICD-10-CM | POA: Diagnosis not present

## 2015-03-18 ENCOUNTER — Other Ambulatory Visit: Payer: Self-pay | Admitting: Radiology

## 2015-03-18 DIAGNOSIS — D242 Benign neoplasm of left breast: Secondary | ICD-10-CM | POA: Diagnosis not present

## 2015-03-18 DIAGNOSIS — Z0000 Encounter for general adult medical examination without abnormal findings: Secondary | ICD-10-CM | POA: Diagnosis not present

## 2015-03-18 DIAGNOSIS — D241 Benign neoplasm of right breast: Secondary | ICD-10-CM | POA: Diagnosis not present

## 2015-03-18 DIAGNOSIS — N6001 Solitary cyst of right breast: Secondary | ICD-10-CM | POA: Diagnosis not present

## 2015-03-18 DIAGNOSIS — N6012 Diffuse cystic mastopathy of left breast: Secondary | ICD-10-CM | POA: Diagnosis not present

## 2015-03-22 ENCOUNTER — Telehealth: Payer: Self-pay | Admitting: Emergency Medicine

## 2015-03-22 NOTE — Telephone Encounter (Signed)
-----   Message from Nunzio Cobbs, MD sent at 03/20/2015 11:46 AM EDT ----- Regarding: please make appointment for patient to see me Please contact patient to make an appointment to see me.   I have done a chart review, and I would like to recommend that she discontinue her estrogen therapy.   Thank you,  Shelly Mosley

## 2015-03-22 NOTE — Telephone Encounter (Signed)
Call to patient. Advised Dr. Quincy Simmonds requested schedule an office visit with her to discuss estrogen use. Patient states "I cannot go off of it." Advised patient that recommendation from Dr. Quincy Simmonds is that she do so, but that she wants to discuss this in person. Patient agreeable. Requested date 04/07/15 for office visit, has evaluation CT and appointment with Dr. Denman George prior on 04/05/15.   Routing to provider for final review. Patient agreeable to disposition. Will close encounter.

## 2015-03-25 DIAGNOSIS — E789 Disorder of lipoprotein metabolism, unspecified: Secondary | ICD-10-CM | POA: Diagnosis not present

## 2015-03-25 DIAGNOSIS — I10 Essential (primary) hypertension: Secondary | ICD-10-CM | POA: Diagnosis not present

## 2015-03-25 DIAGNOSIS — E0789 Other specified disorders of thyroid: Secondary | ICD-10-CM | POA: Diagnosis not present

## 2015-03-29 ENCOUNTER — Ambulatory Visit (INDEPENDENT_AMBULATORY_CARE_PROVIDER_SITE_OTHER): Payer: Medicare Other | Admitting: Pulmonary Disease

## 2015-03-29 ENCOUNTER — Encounter: Payer: Self-pay | Admitting: Pulmonary Disease

## 2015-03-29 VITALS — BP 120/70 | HR 86 | Ht 66.75 in | Wt 261.6 lb

## 2015-03-29 DIAGNOSIS — J432 Centrilobular emphysema: Secondary | ICD-10-CM

## 2015-03-29 DIAGNOSIS — J449 Chronic obstructive pulmonary disease, unspecified: Secondary | ICD-10-CM | POA: Diagnosis not present

## 2015-03-29 NOTE — Patient Instructions (Signed)
We will refer you to pulmonary rehab Keep taking your meidicine as you are doing We will see you back in 6 months or sooner if needed

## 2015-03-29 NOTE — Assessment & Plan Note (Signed)
This has been a stable interval for her. She has moderate airflow obstruction and her shortness of breath is multifactorial. Obesity and deconditioning contributed. However, she is a motivated patient and has been exercising regularly. She would like to be more aggressive with exercise. She can only take a modest amount of broncho-dilators because of her severe and mouth dryness.  Plan: Continue duoneb as she is doing Referral to pulmonary rehabilitation Get a flu shot in the fall Follow-up 6 months or sooner if needed
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
## 2015-03-29 NOTE — Assessment & Plan Note (Signed)
Fortunately this problem resolved as her most recent chest x-ray showed complete clearing of the pneumonia.

## 2015-03-30 ENCOUNTER — Encounter (HOSPITAL_COMMUNITY): Payer: Self-pay

## 2015-03-30 ENCOUNTER — Ambulatory Visit (HOSPITAL_COMMUNITY)
Admission: RE | Admit: 2015-03-30 | Discharge: 2015-03-30 | Disposition: A | Discharge status: Home / Self Care | Payer: Medicare Other | Source: Ambulatory Visit | Attending: Gynecologic Oncology | Admitting: Gynecologic Oncology

## 2015-03-30 DIAGNOSIS — I7090 Unspecified atherosclerosis: Secondary | ICD-10-CM | POA: Diagnosis not present

## 2015-03-30 DIAGNOSIS — R188 Other ascites: Secondary | ICD-10-CM

## 2015-03-30 DIAGNOSIS — Z9049 Acquired absence of other specified parts of digestive tract: Secondary | ICD-10-CM | POA: Diagnosis not present

## 2015-03-30 DIAGNOSIS — R590 Localized enlarged lymph nodes: Secondary | ICD-10-CM | POA: Diagnosis not present

## 2015-03-30 DIAGNOSIS — K5730 Diverticulosis of large intestine without perforation or abscess without bleeding: Secondary | ICD-10-CM | POA: Diagnosis not present

## 2015-03-30 DIAGNOSIS — K838 Other specified diseases of biliary tract: Secondary | ICD-10-CM | POA: Diagnosis not present

## 2015-03-30 DIAGNOSIS — N858 Other specified noninflammatory disorders of uterus: Secondary | ICD-10-CM | POA: Diagnosis not present

## 2015-03-30 DIAGNOSIS — C541 Malignant neoplasm of endometrium: Secondary | ICD-10-CM | POA: Diagnosis not present

## 2015-03-30 HISTORY — DX: Unspecified type of carcinoma in situ of unspecified breast: D05.90

## 2015-03-30 HISTORY — DX: Malignant neoplasm of endometrium: C54.1

## 2015-03-30 LAB — POCT I-STAT CREATININE: Creatinine, Ser: 0.8 mg/dL (ref 0.44–1.00)

## 2015-03-30 MED ORDER — IOHEXOL 300 MG/ML  SOLN
100.0000 mL | Freq: Once | INTRAMUSCULAR | Status: AC | PRN
  Administered 2015-03-30: 100 mL via INTRAVENOUS

## 2015-04-01 DIAGNOSIS — C50919 Malignant neoplasm of unspecified site of unspecified female breast: Secondary | ICD-10-CM | POA: Diagnosis not present

## 2015-04-01 DIAGNOSIS — J42 Unspecified chronic bronchitis: Secondary | ICD-10-CM | POA: Diagnosis not present

## 2015-04-01 DIAGNOSIS — E789 Disorder of lipoprotein metabolism, unspecified: Secondary | ICD-10-CM | POA: Diagnosis not present

## 2015-04-05 ENCOUNTER — Encounter: Payer: Self-pay | Admitting: Gynecologic Oncology

## 2015-04-05 ENCOUNTER — Ambulatory Visit: Payer: Medicare Other | Attending: Gynecologic Oncology | Admitting: Gynecologic Oncology

## 2015-04-05 VITALS — BP 123/68 | HR 70 | Temp 98.1°F | Resp 18 | Ht 66.75 in | Wt 262.6 lb

## 2015-04-05 DIAGNOSIS — C541 Malignant neoplasm of endometrium: Secondary | ICD-10-CM | POA: Insufficient documentation

## 2015-04-05 NOTE — Patient Instructions (Signed)
Plan to follow up in six months or sooner if needed.  Please call for any questions, concerns, or new symptoms.
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
## 2015-04-06 NOTE — Addendum Note (Signed)
Addended by: Len Blalock on: 04/06/2015 04:50 PM   Modules accepted: Orders

## 2015-04-07 ENCOUNTER — Encounter: Payer: Self-pay | Admitting: Obstetrics and Gynecology

## 2015-04-07 ENCOUNTER — Ambulatory Visit (INDEPENDENT_AMBULATORY_CARE_PROVIDER_SITE_OTHER): Payer: Medicare Other | Admitting: Obstetrics and Gynecology

## 2015-04-07 VITALS — BP 138/72 | HR 68 | Ht 66.5 in | Wt 262.2 lb

## 2015-04-07 DIAGNOSIS — Z79899 Other long term (current) drug therapy: Secondary | ICD-10-CM | POA: Diagnosis not present

## 2015-04-07 DIAGNOSIS — Z803 Family history of malignant neoplasm of breast: Secondary | ICD-10-CM | POA: Diagnosis not present

## 2015-04-07 DIAGNOSIS — Z8542 Personal history of malignant neoplasm of other parts of uterus: Secondary | ICD-10-CM

## 2015-04-07 DIAGNOSIS — H04129 Dry eye syndrome of unspecified lacrimal gland: Secondary | ICD-10-CM

## 2015-04-07 NOTE — Patient Instructions (Signed)
We will call you to see if we can arrange an appointment for you to see Dr. Ananias Pilgrim, your dry eye specialist regarding options for care that may not include estrogen.
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
## 2015-04-08 ENCOUNTER — Telehealth: Payer: Self-pay | Admitting: Pulmonary Disease

## 2015-04-08 NOTE — Telephone Encounter (Signed)
[email or web address] x1 

## 2015-04-09 ENCOUNTER — Telehealth: Payer: Self-pay | Admitting: Emergency Medicine

## 2015-04-09 DIAGNOSIS — E0500 Thyrotoxicosis with diffuse goiter without thyrotoxic crisis or storm: Secondary | ICD-10-CM

## 2015-04-09 DIAGNOSIS — H04123 Dry eye syndrome of bilateral lacrimal glands: Secondary | ICD-10-CM

## 2015-04-09 NOTE — Telephone Encounter (Signed)
-----   Message from Nunzio Cobbs, MD sent at 04/08/2015  7:57 AM EDT ----- Regarding: please make an appointment for patient to see Dr. [facility] [facility] opthamology in [place] Please make a referral for patient to see Dr. Ananias Pilgrim of [facility] located in [place].   Patient has seen him in the past.  She has dry eyes and Graves disease of the eyes and needs to be able to come off her estrogen if possible.    I need him to reconsult and make recommendations for the patient about alternatives to estrogen if possible.   If he is not in practice, we need to make a referral to [facility] for specialist in dry eye.   Thank you,   Josefa Half

## 2015-04-09 NOTE — Telephone Encounter (Signed)
Spoke with molly, received order for [employer], there are 2 diagnosis checked - COPD / Emphysema  COPD will not be accepted bc she does not have supporting PFT. Per Cloyde Reams, Check 'OTHER' and write in Shortness of breath as the diagnosis and they will be able to accept this .  Form is being faxed back to Ashley/Dr Lake Bells to address.

## 2015-04-09 NOTE — Telephone Encounter (Signed)
Patient scheduled to see Dr. Margaretmary Dys 04/13/15 at [facility]. She is notified of appointment and agreeable. Records faxed for appointment, fax confirmation received.   [facility] [address] [place], [place], [place] [address] [phone] Fax: [phone]  Routing to provider for final review. Patient agreeable to disposition. Will close encounter.    cc Kerry Hough

## 2015-04-12 NOTE — Telephone Encounter (Signed)
This has been placed in BQ's look at folder, I've not yet received it back.

## 2015-04-12 NOTE — Telephone Encounter (Signed)
Caryl Pina - has this been done?

## 2015-04-13 DIAGNOSIS — Z8639 Personal history of other endocrine, nutritional and metabolic disease: Secondary | ICD-10-CM | POA: Diagnosis not present

## 2015-04-13 DIAGNOSIS — H01001 Unspecified blepharitis right upper eyelid: Secondary | ICD-10-CM | POA: Diagnosis not present

## 2015-04-13 DIAGNOSIS — H16223 Keratoconjunctivitis sicca, not specified as Sjogren's, bilateral: Secondary | ICD-10-CM | POA: Diagnosis not present

## 2015-04-13 DIAGNOSIS — E349 Endocrine disorder, unspecified: Secondary | ICD-10-CM | POA: Insufficient documentation

## 2015-04-13 DIAGNOSIS — H0288A Meibomian gland dysfunction right eye, upper and lower eyelids: Secondary | ICD-10-CM | POA: Insufficient documentation

## 2015-04-14 ENCOUNTER — Other Ambulatory Visit: Payer: Self-pay | Admitting: General Surgery

## 2015-04-14 DIAGNOSIS — N6489 Other specified disorders of breast: Secondary | ICD-10-CM

## 2015-04-14 NOTE — Telephone Encounter (Signed)
Caryl Pina - has this been done?

## 2015-04-14 NOTE — Telephone Encounter (Signed)
This has been signed and faxed.  Nothing further needed.

## 2015-04-15 DIAGNOSIS — F330 Major depressive disorder, recurrent, mild: Secondary | ICD-10-CM | POA: Diagnosis not present

## 2015-04-23 ENCOUNTER — Encounter (HOSPITAL_BASED_OUTPATIENT_CLINIC_OR_DEPARTMENT_OTHER): Payer: Self-pay | Admitting: *Deleted

## 2015-04-23 NOTE — Progress Notes (Signed)
Patient is scheduled for seed placement at 1100 at [facility]. She will come here first for labs then back here for surgery at 1430.

## 2015-04-27 ENCOUNTER — Ambulatory Visit (HOSPITAL_BASED_OUTPATIENT_CLINIC_OR_DEPARTMENT_OTHER): Payer: Medicare Other | Admitting: Certified Registered"

## 2015-04-27 ENCOUNTER — Ambulatory Visit (HOSPITAL_BASED_OUTPATIENT_CLINIC_OR_DEPARTMENT_OTHER)
Admission: RE | Admit: 2015-04-27 | Discharge: 2015-04-27 | Disposition: A | Discharge status: Home / Self Care | Payer: Medicare Other | Source: Ambulatory Visit | Attending: General Surgery | Admitting: General Surgery

## 2015-04-27 ENCOUNTER — Encounter (HOSPITAL_BASED_OUTPATIENT_CLINIC_OR_DEPARTMENT_OTHER): Payer: Self-pay | Admitting: *Deleted

## 2015-04-27 ENCOUNTER — Encounter (HOSPITAL_BASED_OUTPATIENT_CLINIC_OR_DEPARTMENT_OTHER)
Admission: RE | Disposition: A | Discharge status: Home / Self Care | Payer: Self-pay | Source: Ambulatory Visit | Attending: General Surgery

## 2015-04-27 DIAGNOSIS — N641 Fat necrosis of breast: Secondary | ICD-10-CM | POA: Diagnosis not present

## 2015-04-27 DIAGNOSIS — D4862 Neoplasm of uncertain behavior of left breast: Secondary | ICD-10-CM | POA: Diagnosis not present

## 2015-04-27 DIAGNOSIS — N6489 Other specified disorders of breast: Secondary | ICD-10-CM

## 2015-04-27 DIAGNOSIS — Z0000 Encounter for general adult medical examination without abnormal findings: Secondary | ICD-10-CM | POA: Diagnosis not present

## 2015-04-27 DIAGNOSIS — Z87891 Personal history of nicotine dependence: Secondary | ICD-10-CM | POA: Diagnosis not present

## 2015-04-27 DIAGNOSIS — F419 Anxiety disorder, unspecified: Secondary | ICD-10-CM | POA: Insufficient documentation

## 2015-04-27 DIAGNOSIS — I10 Essential (primary) hypertension: Secondary | ICD-10-CM | POA: Diagnosis not present

## 2015-04-27 DIAGNOSIS — R921 Mammographic calcification found on diagnostic imaging of breast: Secondary | ICD-10-CM | POA: Diagnosis not present

## 2015-04-27 DIAGNOSIS — N6092 Unspecified benign mammary dysplasia of left breast: Secondary | ICD-10-CM | POA: Insufficient documentation

## 2015-04-27 DIAGNOSIS — M549 Dorsalgia, unspecified: Secondary | ICD-10-CM | POA: Insufficient documentation

## 2015-04-27 DIAGNOSIS — J439 Emphysema, unspecified: Secondary | ICD-10-CM | POA: Insufficient documentation

## 2015-04-27 DIAGNOSIS — E669 Obesity, unspecified: Secondary | ICD-10-CM | POA: Diagnosis not present

## 2015-04-27 DIAGNOSIS — K219 Gastro-esophageal reflux disease without esophagitis: Secondary | ICD-10-CM | POA: Insufficient documentation

## 2015-04-27 DIAGNOSIS — C50912 Malignant neoplasm of unspecified site of left female breast: Secondary | ICD-10-CM | POA: Diagnosis not present

## 2015-04-27 DIAGNOSIS — E079 Disorder of thyroid, unspecified: Secondary | ICD-10-CM | POA: Insufficient documentation

## 2015-04-27 DIAGNOSIS — E780 Pure hypercholesterolemia: Secondary | ICD-10-CM | POA: Insufficient documentation

## 2015-04-27 DIAGNOSIS — F329 Major depressive disorder, single episode, unspecified: Secondary | ICD-10-CM | POA: Diagnosis not present

## 2015-04-27 DIAGNOSIS — J449 Chronic obstructive pulmonary disease, unspecified: Secondary | ICD-10-CM | POA: Diagnosis not present

## 2015-04-27 DIAGNOSIS — Z79899 Other long term (current) drug therapy: Secondary | ICD-10-CM | POA: Diagnosis not present

## 2015-04-27 DIAGNOSIS — N6082 Other benign mammary dysplasias of left breast: Secondary | ICD-10-CM | POA: Diagnosis not present

## 2015-04-27 DIAGNOSIS — Z9981 Dependence on supplemental oxygen: Secondary | ICD-10-CM | POA: Insufficient documentation

## 2015-04-27 DIAGNOSIS — N6012 Diffuse cystic mastopathy of left breast: Secondary | ICD-10-CM | POA: Insufficient documentation

## 2015-04-27 DIAGNOSIS — Z803 Family history of malignant neoplasm of breast: Secondary | ICD-10-CM | POA: Insufficient documentation

## 2015-04-27 DIAGNOSIS — L905 Scar conditions and fibrosis of skin: Secondary | ICD-10-CM | POA: Diagnosis not present

## 2015-04-27 HISTORY — PX: BREAST LUMPECTOMY WITH RADIOACTIVE SEED LOCALIZATION: SHX6424

## 2015-04-27 LAB — POCT I-STAT, CHEM 8
BUN: 16 mg/dL (ref 6–20)
CALCIUM ION: 1.21 mmol/L (ref 1.13–1.30)
Chloride: 102 mmol/L (ref 101–111)
Creatinine, Ser: 0.8 mg/dL (ref 0.44–1.00)
GLUCOSE: 93 mg/dL (ref 65–99)
HCT: 40 % (ref 36.0–46.0)
Hemoglobin: 13.6 g/dL (ref 12.0–15.0)
Potassium: 4.1 mmol/L (ref 3.5–5.1)
SODIUM: 138 mmol/L (ref 135–145)
TCO2: 27 mmol/L (ref 0–100)

## 2015-04-27 SURGERY — BREAST LUMPECTOMY WITH RADIOACTIVE SEED LOCALIZATION
Anesthesia: General | Site: Breast | Laterality: Left

## 2015-04-27 MED ORDER — PROPOFOL 10 MG/ML IV BOLUS
INTRAVENOUS | Status: DC | PRN
  Administered 2015-04-27: 200 mg via INTRAVENOUS

## 2015-04-27 MED ORDER — LACTATED RINGERS IV SOLN
INTRAVENOUS | Status: DC
Start: 2015-04-27 — End: 2015-04-27
  Administered 2015-04-27 (×2): via INTRAVENOUS

## 2015-04-27 MED ORDER — BUPIVACAINE HCL (PF) 0.25 % IJ SOLN
INTRAMUSCULAR | Status: AC
  Filled 2015-04-27: qty 30

## 2015-04-27 MED ORDER — PROMETHAZINE HCL 25 MG/ML IJ SOLN: 6.2500 mg | INTRAMUSCULAR | Status: DC | PRN

## 2015-04-27 MED ORDER — HYDROMORPHONE HCL 1 MG/ML IJ SOLN
0.2500 mg | INTRAMUSCULAR | Status: DC | PRN
  Administered 2015-04-27: 0.5 mg via INTRAVENOUS

## 2015-04-27 MED ORDER — HYDROCODONE-ACETAMINOPHEN 5-325 MG PO TABS: 1.0000 | ORAL_TABLET | ORAL | Status: DC | PRN

## 2015-04-27 MED ORDER — BUPIVACAINE HCL (PF) 0.25 % IJ SOLN
INTRAMUSCULAR | Status: DC | PRN
  Administered 2015-04-27: 10 mL

## 2015-04-27 MED ORDER — FENTANYL CITRATE (PF) 100 MCG/2ML IJ SOLN
INTRAMUSCULAR | Status: AC
  Filled 2015-04-27: qty 4

## 2015-04-27 MED ORDER — MIDAZOLAM HCL 2 MG/2ML IJ SOLN: 1.0000 mg | INTRAMUSCULAR | Status: DC | PRN

## 2015-04-27 MED ORDER — LIDOCAINE HCL (CARDIAC) 20 MG/ML IV SOLN
INTRAVENOUS | Status: DC | PRN
  Administered 2015-04-27: 60 mg via INTRAVENOUS

## 2015-04-27 MED ORDER — CIPROFLOXACIN IN D5W 400 MG/200ML IV SOLN
INTRAVENOUS | Status: AC
  Filled 2015-04-27: qty 200

## 2015-04-27 MED ORDER — FENTANYL CITRATE (PF) 100 MCG/2ML IJ SOLN
INTRAMUSCULAR | Status: AC
  Filled 2015-04-27: qty 2

## 2015-04-27 MED ORDER — SCOPOLAMINE 1 MG/3DAYS TD PT72: 1.0000 | MEDICATED_PATCH | Freq: Once | TRANSDERMAL | Status: DC | PRN

## 2015-04-27 MED ORDER — ONDANSETRON HCL 4 MG/2ML IJ SOLN
INTRAMUSCULAR | Status: DC | PRN
  Administered 2015-04-27: 4 mg via INTRAVENOUS

## 2015-04-27 MED ORDER — GLYCOPYRROLATE 0.2 MG/ML IJ SOLN: 0.2000 mg | Freq: Once | INTRAMUSCULAR | Status: DC | PRN

## 2015-04-27 MED ORDER — DEXAMETHASONE SODIUM PHOSPHATE 4 MG/ML IJ SOLN
INTRAMUSCULAR | Status: DC | PRN
  Administered 2015-04-27: 10 mg via INTRAVENOUS

## 2015-04-27 MED ORDER — CIPROFLOXACIN IN D5W 400 MG/200ML IV SOLN
400.0000 mg | INTRAVENOUS | Status: AC
  Administered 2015-04-27: 400 mg via INTRAVENOUS

## 2015-04-27 MED ORDER — HYDROMORPHONE HCL 1 MG/ML IJ SOLN
INTRAMUSCULAR | Status: AC
  Filled 2015-04-27: qty 1

## 2015-04-27 MED ORDER — FENTANYL CITRATE (PF) 100 MCG/2ML IJ SOLN
50.0000 ug | INTRAMUSCULAR | Status: AC | PRN
  Administered 2015-04-27 (×2): 25 ug via INTRAVENOUS
  Administered 2015-04-27: 50 ug via INTRAVENOUS

## 2015-04-27 MED ORDER — EPHEDRINE SULFATE 50 MG/ML IJ SOLN
INTRAMUSCULAR | Status: DC | PRN
  Administered 2015-04-27: 10 mg via INTRAVENOUS

## 2015-04-27 MED ORDER — FENTANYL CITRATE (PF) 100 MCG/2ML IJ SOLN
25.0000 ug | INTRAMUSCULAR | Status: DC | PRN
  Administered 2015-04-27: 50 ug via INTRAVENOUS
  Administered 2015-04-27: 25 ug via INTRAVENOUS

## 2015-04-27 MED ORDER — CHLORHEXIDINE GLUCONATE 4 % EX LIQD: 1.0000 "application " | Freq: Once | CUTANEOUS | Status: DC

## 2015-04-27 MED ORDER — DIPHENHYDRAMINE HCL 50 MG/ML IJ SOLN
INTRAMUSCULAR | Status: AC
  Filled 2015-04-27: qty 1

## 2015-04-27 SURGICAL SUPPLY — 46 items
APPLIER CLIP 9.375 MED OPEN (MISCELLANEOUS)
APR CLP MED 9.3 20 MLT OPN (MISCELLANEOUS)
BINDER BREAST LRG (GAUZE/BANDAGES/DRESSINGS) IMPLANT
BINDER BREAST MEDIUM (GAUZE/BANDAGES/DRESSINGS) IMPLANT
BINDER BREAST XLRG (GAUZE/BANDAGES/DRESSINGS) IMPLANT
BINDER BREAST XXLRG (GAUZE/BANDAGES/DRESSINGS) IMPLANT
BLADE SURG 15 STRL LF DISP TIS (BLADE) ×1 IMPLANT
BLADE SURG 15 STRL SS (BLADE) ×2
CANISTER SUC SOCK COL 7IN (MISCELLANEOUS) IMPLANT
CANISTER SUCT 1200ML W/VALVE (MISCELLANEOUS) IMPLANT
CHLORAPREP W/TINT 26ML (MISCELLANEOUS) ×2 IMPLANT
CLIP APPLIE 9.375 MED OPEN (MISCELLANEOUS) IMPLANT
CLIP TI WIDE RED SMALL 6 (CLIP) ×1 IMPLANT
COVER BACK TABLE 60X90IN (DRAPES) ×2 IMPLANT
COVER MAYO STAND STRL (DRAPES) ×2 IMPLANT
COVER PROBE W GEL 5X96 (DRAPES) ×2 IMPLANT
DECANTER SPIKE VIAL GLASS SM (MISCELLANEOUS) ×1 IMPLANT
DEVICE DUBIN W/COMP PLATE 8390 (MISCELLANEOUS) ×2 IMPLANT
DRAPE LAPAROSCOPIC ABDOMINAL (DRAPES) ×2 IMPLANT
DRAPE UTILITY XL STRL (DRAPES) ×2 IMPLANT
ELECT COATED BLADE 2.86 ST (ELECTRODE) ×2 IMPLANT
ELECT REM PT RETURN 9FT ADLT (ELECTROSURGICAL) ×2
ELECTRODE REM PT RTRN 9FT ADLT (ELECTROSURGICAL) ×1 IMPLANT
GLOVE BIOGEL PI IND STRL 8 (GLOVE) ×1 IMPLANT
GLOVE BIOGEL PI INDICATOR 8 (GLOVE) ×1
GLOVE ECLIPSE 7.5 STRL STRAW (GLOVE) ×2 IMPLANT
GOWN STRL REUS W/ TWL LRG LVL3 (GOWN DISPOSABLE) ×1 IMPLANT
GOWN STRL REUS W/ TWL XL LVL3 (GOWN DISPOSABLE) ×1 IMPLANT
GOWN STRL REUS W/TWL LRG LVL3 (GOWN DISPOSABLE) ×4
GOWN STRL REUS W/TWL XL LVL3 (GOWN DISPOSABLE) ×2
KIT MARKER MARGIN INK (KITS) ×2 IMPLANT
LIQUID BAND (GAUZE/BANDAGES/DRESSINGS) ×2 IMPLANT
NDL HYPO 25X1 1.5 SAFETY (NEEDLE) ×1 IMPLANT
NEEDLE HYPO 25X1 1.5 SAFETY (NEEDLE) ×2 IMPLANT
NS IRRIG 1000ML POUR BTL (IV SOLUTION) ×1 IMPLANT
PACK BASIN DAY SURGERY FS (CUSTOM PROCEDURE TRAY) ×2 IMPLANT
PENCIL BUTTON HOLSTER BLD 10FT (ELECTRODE) ×2 IMPLANT
SLEEVE SCD COMPRESS KNEE MED (MISCELLANEOUS) ×2 IMPLANT
SPONGE LAP 4X18 X RAY DECT (DISPOSABLE) ×2 IMPLANT
SUT MON AB 5-0 PS2 18 (SUTURE) ×2 IMPLANT
SUT VICRYL 3-0 CR8 SH (SUTURE) ×2 IMPLANT
SYR CONTROL 10ML LL (SYRINGE) ×2 IMPLANT
TOWEL OR 17X24 6PK STRL BLUE (TOWEL DISPOSABLE) ×2 IMPLANT
TOWEL OR NON WOVEN STRL DISP B (DISPOSABLE) ×2 IMPLANT
TUBE CONNECTING 20X1/4 (TUBING) ×1 IMPLANT
YANKAUER SUCT BULB TIP NO VENT (SUCTIONS) ×1 IMPLANT

## 2015-04-27 NOTE — Anesthesia Postprocedure Evaluation (Signed)
  Anesthesia Post-op Note  Patient: Shelly Mosley  Procedure(s) Performed: Procedure(s) (LRB): LEFT BREAST LUMPECTOMY WITH RADIOACTIVE SEED LOCALIZATION (Left)  Patient Location: PACU  Anesthesia Type: General  Level of Consciousness: awake and alert   Airway and Oxygen Therapy: Patient Spontanous Breathing  Post-op Pain: mild  Post-op Assessment: Post-op Vital signs reviewed, Patient's Cardiovascular Status Stable, Respiratory Function Stable, Patent Airway and No signs of Nausea or vomiting  Last Vitals:  Filed Vitals:   04/27/15 1700  BP: 105/45  Pulse: 89  Temp:   Resp: 22    Post-op Vital Signs: stable   Complications: No apparent anesthesia complications

## 2015-04-27 NOTE — Discharge Instructions (Signed)
[place] Office Phone Number [phone]  BREAST BIOPSY/ Lupectomy: POST OP INSTRUCTIONS  Always review your discharge instruction sheet given to you by the facility where your surgery was performed.  IF YOU HAVE DISABILITY OR FAMILY LEAVE FORMS, YOU MUST BRING THEM TO THE OFFICE FOR PROCESSING.  DO NOT GIVE THEM TO YOUR DOCTOR.  1. A prescription for pain medication may be given to you upon discharge.  Take your pain medication as prescribed, if needed.  If narcotic pain medicine is not needed, then you may take acetaminophen (Tylenol) or ibuprofen (Advil) as needed. 2. Take your usually prescribed medications unless otherwise directed 3. If you need a refill on your pain medication, please contact your pharmacy.  They will contact our office to request authorization.  Prescriptions will not be filled after 5pm or on week-ends. 4. You should eat very light the first 24 hours after surgery, such as soup, crackers, pudding, etc.  Resume your normal diet the day after surgery. 5. Most patients will experience some swelling and bruising in the breast.  Ice packs and a good support bra will help.  Swelling and bruising can take several days to resolve.  6. It is common to experience some constipation if taking pain medication after surgery.  Increasing fluid intake and taking a stool softener will usually help or prevent this problem from occurring.  A mild laxative (Milk of Magnesia or Miralax) should be taken according to package directions if there are no bowel movements after 48 hours. 7. Unless discharge instructions indicate otherwise, you may remove your bandages 24-48 hours after surgery, and you may shower at that time.  You may have steri-strips (small skin tapes) in place directly over the incision.  These strips should be left on the skin for 7-10 days.  If your surgeon used skin glue on the incision, you may shower in 24 hours.  The glue will flake off over the next 2-3  weeks.  Any sutures or staples will be removed at the office during your follow-up visit. 8. ACTIVITIES:  You may resume regular daily activities (gradually increasing) beginning the next day.  Wearing a good support bra or sports bra minimizes pain and swelling.  You may have sexual intercourse when it is comfortable. a. You may drive when you no longer are taking prescription pain medication, you can comfortably wear a seatbelt, and you can safely maneuver your car and apply brakes. b. RETURN TO WORK:  ______________________________________________________________________________________ 9. You should see your doctor in the office for a follow-up appointment approximately two weeks after your surgery.  Your doctors nurse will typically make your follow-up appointment when she calls you with your pathology report.  Expect your pathology report 2-3 business days after your surgery.  You may call to check if you do not hear from [place] after three days. 10. OTHER INSTRUCTIONS: _______________________________________________________________________________________________ _____________________________________________________________________________________________________________________________________ _____________________________________________________________________________________________________________________________________ _____________________________________________________________________________________________________________________________________  WHEN TO CALL YOUR DOCTOR: 1. Fever over 101.0 2. Nausea and/or vomiting. 3. Extreme swelling or bruising. 4. Continued bleeding from incision. 5. Increased pain, redness, or drainage from the incision.  The clinic staff is available to answer your questions during regular business hours.  Please dont hesitate to call and ask to speak to one of the nurses for clinical concerns.  If you have a medical emergency, go to the nearest emergency  room or call 911.  A surgeon from [facility] is always on call at the hospital.  For further questions, please visit [email or web address]  Post Anesthesia Home Care Instructions ° °Activity: °Get plenty of rest for the remainder of the day. A responsible adult should stay with you for 24 hours following the procedure.  °For the next 24 hours, DO NOT: °-Drive a car °-Operate machinery °-Drink alcoholic beverages °-Take any medication unless instructed by your physician °-Make any legal decisions or sign important papers. ° °Meals: °Start with liquid foods such as gelatin or soup. Progress to regular foods as tolerated. Avoid greasy, spicy, heavy foods. If nausea and/or vomiting occur, drink only clear liquids until the nausea and/or vomiting subsides. Call your physician if vomiting continues. ° °Special Instructions/Symptoms: °Your throat may feel dry or sore from the anesthesia or the breathing tube placed in your throat during surgery. If this causes discomfort, gargle with warm salt water. The discomfort should disappear within 24 hours. ° °If you had a scopolamine patch placed behind your ear for the management of post- operative nausea and/or vomiting: ° °1. The medication in the patch is effective for 72 hours, after which it should be removed.  Wrap patch in a tissue and discard in the trash. Wash hands thoroughly with soap and water. °2. You may remove the patch earlier than 72 hours if you experience unpleasant side effects which may include dry mouth, dizziness or visual disturbances. °3. Avoid touching the patch. Wash your hands with soap and water after contact with the patch. °  ° °

## 2015-04-27 NOTE — Anesthesia Procedure Notes (Signed)
Procedure Name: LMA Insertion Date/Time: 04/27/2015 4:00 PM Performed by: Suzzane Quilter D Pre-anesthesia Checklist: Patient identified, Emergency Drugs available, Suction available and Patient being monitored Patient Re-evaluated:Patient Re-evaluated prior to inductionOxygen Delivery Method: Circle System Utilized Preoxygenation: Pre-oxygenation with 100% oxygen Intubation Type: IV induction Ventilation: Mask ventilation without difficulty LMA: LMA inserted LMA Size: 4.0 Number of attempts: 1 Airway Equipment and Method: Bite block Placement Confirmation: positive ETCO2 Tube secured with: Tape Dental Injury: Teeth and Oropharynx as per pre-operative assessment

## 2015-04-27 NOTE — Transfer of Care (Signed)
Immediate Anesthesia Transfer of Care Note  Patient: Shelly Mosley  Procedure(s) Performed: Procedure(s): LEFT BREAST LUMPECTOMY WITH RADIOACTIVE SEED LOCALIZATION (Left)  Patient Location: PACU  Anesthesia Type:General  Level of Consciousness: awake, oriented and patient cooperative  Airway & Oxygen Therapy: Patient Spontanous Breathing and Patient connected to face mask oxygen  Post-op Assessment: Report given to RN and Post -op Vital signs reviewed and stable  Post vital signs: Reviewed and stable  Last Vitals:  Filed Vitals:   04/27/15 1247  BP: 110/45  Pulse: 69  Temp: 37 C  Resp: 20    Complications: No apparent anesthesia complications

## 2015-04-27 NOTE — Anesthesia Preprocedure Evaluation (Addendum)
Anesthesia Evaluation  Patient identified by MRN, date of birth, ID band Patient awake    Reviewed: Allergy & Precautions, NPO status , Patient's Chart, lab work & pertinent test results  Airway Mallampati: II  TM Distance: >3 FB Neck ROM: Full    Dental no notable dental hx.    Pulmonary COPDformer smoker,  breath sounds clear to auscultation  + decreased breath sounds      Cardiovascular hypertension, Pt. on medications Normal cardiovascular examRhythm:Regular Rate:Normal     Neuro/Psych negative neurological ROS  negative psych ROS   GI/Hepatic negative GI ROS, Neg liver ROS,   Endo/Other  negative endocrine ROSHypothyroidism Morbid obesity  Renal/GU negative Renal ROS  negative genitourinary   Musculoskeletal negative musculoskeletal ROS (+)   Abdominal   Peds negative pediatric ROS (+)  Hematology negative hematology ROS (+)   Anesthesia Other Findings   Reproductive/Obstetrics negative OB ROS                            Anesthesia Physical Anesthesia Plan  ASA: III  Anesthesia Plan: General   Post-op Pain Management:    Induction: Intravenous  Airway Management Planned: LMA  Additional Equipment:   Intra-op Plan:   Post-operative Plan: Extubation in OR  Informed Consent: I have reviewed the patients History and Physical, chart, labs and discussed the procedure including the risks, benefits and alternatives for the proposed anesthesia with the patient or authorized representative who has indicated his/her understanding and acceptance.   Dental advisory given  Plan Discussed with: CRNA and Surgeon  Anesthesia Plan Comments:         Anesthesia Quick Evaluation

## 2015-04-27 NOTE — H&P (Signed)
History of Present Illness Marland Kitchen T. Jackye Dever MD; 04/14/2015 2:18 PM) Patient words: left breast.  The patient is a 69 year old female who presents with a complaint of Breast problems. She comes to the office for the courtesy of Dr. Isaiah Blakes for recent abnormal mammogram and biopsy showing radial scar. The patient has a significant family history of breast cancer in her mother and her sister and maternal aunt. Her sister has previously had negative genetic testing but the patient has not due to financial problems and insurance coverage. However she has had routine screening mammograms. She does take regular estrogen due to a dry eye problem that nothing else helps. She has a history of endometrial cancer. Recent mammograms which I reviewed has shown a new small area of calcifications at the 12 o'clock position left breast as well as a possible mass in the right breast. Subsequent ultrasound showed a complex cyst in the right breast and biopsy shows only fibroadipose tissue. Stereotactic left breast biopsy was performed which has returned showing a complex sclerosing lesion associated with calcifications and pseudo-angiomatous stromal hyperplasia. She has not noted any breast lumps. No nipple discharge or skin changes. She has some chronic breast discomfort due to fibrocystic disease.   Other Problems Marjean Donna, CMA; 04/14/2015 1:24 PM) Anxiety Disorder Back Pain Cholelithiasis Chronic Obstructive Lung Disease Depression Emphysema Of Lung Gastroesophageal Reflux Disease Hemorrhoids High blood pressure Home Oxygen Use Hypercholesterolemia Lump In Breast Oophorectomy Bilateral. Thyroid Disease  Past Surgical History Marjean Donna, CMA; 04/14/2015 1:24 PM) Breast Biopsy Bilateral. Colon Removal - Partial Foot Surgery Left. Gallbladder Surgery - Laparoscopic Hemorrhoidectomy Hysterectomy (due to cancer) - Complete Spinal Surgery - Lower  Back Tonsillectomy  Diagnostic Studies History Marjean Donna, CMA; 04/14/2015 1:24 PM) Colonoscopy within last year Mammogram within last year Pap Smear 1-5 years ago  Medication History Marjean Donna, CMA; 04/14/2015 1:26 PM) TraMADol HCl (50MG  Tablet, Oral) Active. Lyrica (100MG  Capsule, Oral) Active. Estradiol (0.05MG /24HR Patch TW, Transdermal) Active. Furosemide (80MG  Tablet, Oral) Active. Losartan Potassium (50MG  Tablet, Oral) Active. Armour Thyroid (120MG  Tablet, Oral) Active. Venlafaxine HCl (25MG  Tablet, Oral) Active. Lacrisert (5MG  Insert, Ophthalmic) Active. DiphenhydrAMINE HCl (50MG  Capsule, Oral) Active. Armour Thyroid (60MG  Tablet, Oral) Active. Medications Reconciled  Social History Marjean Donna, CMA; 04/14/2015 1:24 PM) Alcohol use Remotely quit alcohol use. Caffeine use Carbonated beverages. No drug use Tobacco use Former smoker.  Family History Marjean Donna, CMA; 04/14/2015 1:24 PM) Breast Cancer Family Members In General, Mother, Sister. Colon Polyps Father. Depression Brother, Father, Mother, Sister. Diabetes Mellitus Brother, Family Members In [place], Mother. Heart Disease Father, Mother. Hypertension Brother, Father, Mother, Sister. Prostate Cancer Father. Respiratory Condition Father. Thyroid problems Brother, Mother.  Pregnancy / Birth History Marjean Donna, [place]; 04/14/2015 1:24 PM) Age at menarche 46 years. Age of menopause 65-50 Contraceptive History Oral contraceptives. Gravida 0 Para 0  Review of Systems ([place]; 04/14/2015 1:24 PM) General Present- Fatigue and Weight Gain. Not Present- Appetite Loss, Chills, Fever, Night Sweats and Weight Loss. Skin Present- Dryness. Not Present- Change in Wart/Mole, Hives, Jaundice, New Lesions, Non-Healing Wounds, Rash and Ulcer. HEENT Present- Ringing in the Ears, Seasonal Allergies, Visual Disturbances and Wears glasses/contact lenses. Not Present- Earache, Hearing  Loss, Hoarseness, Nose Bleed, Oral Ulcers, Sinus Pain, Sore Throat and Yellow Eyes. Respiratory Present- Chronic Cough, Difficulty Breathing and Wheezing. Not Present- Bloody sputum and Snoring. Breast Present- Breast Mass and Breast Pain. Not Present- Nipple Discharge and Skin Changes. Cardiovascular Present- Shortness of Breath and Swelling of Extremities. Not  Present- Chest Pain, Difficulty Breathing Lying Down, Leg Cramps, Palpitations and Rapid Heart Rate. Gastrointestinal Present- Abdominal Pain, Chronic diarrhea, Hemorrhoids, Indigestion and Rectal Pain. Not Present- Bloating, Bloody Stool, Change in Bowel Habits, Constipation, Difficulty Swallowing, Excessive gas, Gets full quickly at meals, Nausea and Vomiting. Female Genitourinary Present- Nocturia, Pelvic Pain and Urgency. Not Present- Frequency and Painful Urination. Musculoskeletal Present- Back Pain, Joint Pain, Joint Stiffness, Muscle Pain, Muscle Weakness and Swelling of Extremities. Neurological Present- Decreased Memory and Weakness. Not Present- Fainting, Headaches, Numbness, Seizures, Tingling, Tremor and Trouble walking. Psychiatric Present- Anxiety and Depression. Not Present- Bipolar, Change in Sleep Pattern, Fearful and Frequent crying. Endocrine Present- Cold Intolerance, Excessive Hunger, Hair Changes and Heat Intolerance. Not Present- Hot flashes and New Diabetes. Hematology Present- Gland problems. Not Present- Easy Bruising, Excessive bleeding, HIV and Persistent Infections.   Vitals (Sonya Bynum CMA; 04/14/2015 1:25 PM) 04/14/2015 1:24 PM Weight: 264 lb Height: 66in Body Surface Area: 2.36 m Body Mass Index: 42.61 kg/m Temp.: 53F(Temporal)  Pulse: 84 (Regular)  BP: 126/84 (Sitting, Left Arm, Standard)    Physical Exam Marland Kitchen T. Elzena Muston MD; 04/14/2015 2:19 PM) The physical exam findings are as follows: Note:General: Alert, moderately obese Caucasian female, in no distress Skin: Warm and dry  without rash or infection. HEENT: No palpable masses or thyromegaly. Sclera nonicteric.Lymph nodes: No cervical, supraclavicular, or inguinal nodes palpable. Lungs: Breath sounds clear and equal. No wheezing or increased work of breathing. Cardiovascular: Regular rate and rhythm without murmer. 1-2+ lower extremity edema Abdomen: Nondistended. Soft and nontender. No masses palpable. No organomegaly. No palpable hernias. Well-healed incisions Extremities: 1-2+ lower extremity edema with compression hose in place Neurologic: Alert and fully oriented. Gait normal. No focal weakness. Psychiatric: Normal mood and affect. Thought content appropriate with normal judgement and insight    Assessment & Plan Marland Kitchen T. Ermagene Saidi MD; 04/14/2015 2:23 PM) RADIAL SCAR OF BREAST (611.89  N64.89) Impression: Abnormal mammogram with calcifications 12 o'clock position left breast and core biopsy showing radial scar. She has a worrisome family history and also estrogen use. I've recommended excisional biopsy to rule out underlying malignancy. She is in strong agreement. We discussed the procedure in detail including indications and risks of bleeding, infection, possible diagnoses, risks of anesthesia. All her questions answered. Current Plans  Schedule for Surgery Radioactive seed localized left breast lumpectomy under general anesthesia as an outpatient Pt Education - [facility] Breast Biopsy HCI: discussed with patient and provided information.

## 2015-04-27 NOTE — Op Note (Signed)
Preoperative Diagnosis: left breast cancer  Postoprative Diagnosis: left breast cancer  Procedure: Procedure(s): LEFT BREAST LUMPECTOMY WITH RADIOACTIVE SEED LOCALIZATION   Surgeon: Excell Seltzer T   Assistants: None  Anesthesia:  General LMA anesthesia  Indications: Patient has a strong family history of breast cancer and a recent abnormal screening mammogram with an area of microcalcifications in the upper left breast and large core needle biopsy revealing radial scar. After discussion of options and risks detailed elsewhere we have elected to proceed with radioactive seed localized excisional biopsy as initial surgical treatment    Procedure Detail:  Earlier today the patient underwent accurate placement of a radioactive seed in the left breast near the biopsy clip. Seed placement was confirmed in the holding area with the neoprobe. She was then taken to the operating room, placed in the supine position on the operating table and laryngeal mask general anesthesia induced. The left breast was widely sterilely prepped and draped. PAS were in place. Patient timeout was performed and correct procedure verified. I localized a hot area in the upper outer left breast near the areolar edge with the neoprobe. A circumareolar incision was made and dissection carried down to the subcutaneous tissue. A short skin and subcutaneous flap was raised out over the upper outer quadrant in the neoprobe used to localize the seed. A generous biopsy specimen about 2.5 cm in diameter was then excised with cautery around the area of high counts. The specimen was inked for margins. Specimen x-ray showed the seed and the marking clip within the specimen. This was sent for permanent pathology. There was no bleeding. The cavity was marked with clips. Soft tissue was infiltrated with local anesthesia. Breast and subcutaneous tissue was closed with interrupted 3-0 Vicryl and the skin with subcuticular 4-0 Monocryl and  Dermabond. Sponge needle and instrument counts were correct.    Findings: As above  Estimated Blood Loss:  Minimal         Drains: none  Blood Given: none          Specimens: Left breast tissue        Complications:  * No complications entered in OR log *         Disposition: PACU - hemodynamically stable.         Condition: stable

## 2015-04-27 NOTE — Interval H&P Note (Signed)
History and Physical Interval Note:  04/27/2015 3:49 PM  Shelly Mosley  has presented today for surgery, with the diagnosis of left breast cancer  The various methods of treatment have been discussed with the patient and family. After consideration of risks, benefits and other options for treatment, the patient has consented to  Procedure(s): LEFT BREAST LUMPECTOMY WITH RADIOACTIVE SEED LOCALIZATION (Left) as a surgical intervention .  The patient's history has been reviewed, patient examined, no change in status, stable for surgery.  I have reviewed the patient's chart and labs.  Questions were answered to the patient's satisfaction.     Julissa Browning T

## 2015-04-28 ENCOUNTER — Encounter (HOSPITAL_BASED_OUTPATIENT_CLINIC_OR_DEPARTMENT_OTHER): Payer: Self-pay | Admitting: General Surgery

## 2015-05-03 ENCOUNTER — Telehealth: Payer: Self-pay | Admitting: Pulmonary Disease

## 2015-05-03 DIAGNOSIS — J438 Other emphysema: Secondary | ICD-10-CM

## 2015-05-03 NOTE — Telephone Encounter (Signed)
Spoke with Shelly Mosley at [facility], states that pt's pulm rehab order has a dx code of COPD-pt can be referred for COPD but she will need to have a full PFT (specifically the post bronchodilator) for insurance to cover this dx.  Alternatively, another dx code can be used, but pt cannot have pulm rehab covered with COPD AND another dx code on her order.  BQ please advise if you wish to order under a different dx code, or if you'd like to have a pft performed.  Thanks!

## 2015-05-04 DIAGNOSIS — R05 Cough: Secondary | ICD-10-CM | POA: Diagnosis not present

## 2015-05-04 DIAGNOSIS — J329 Chronic sinusitis, unspecified: Secondary | ICD-10-CM | POA: Diagnosis not present

## 2015-05-04 NOTE — Telephone Encounter (Signed)
I have placed order with emphysema DX. Will forward to [place] to make aware so when the paperwork comes over to be filled out.

## 2015-05-04 NOTE — Telephone Encounter (Signed)
If emphysema will work we can use that Otherwise she can have a repeat PFT

## 2015-05-06 ENCOUNTER — Telehealth: Payer: Self-pay | Admitting: Obstetrics and Gynecology

## 2015-05-06 ENCOUNTER — Encounter: Payer: Self-pay | Admitting: Obstetrics and Gynecology

## 2015-05-06 NOTE — Telephone Encounter (Signed)
Phone call with patient regarding her hormone therapy.   Had a breast biopsy showing atypical ductal hyperplasia.  Sees Dr. Excell Seltzer and will have follow up with him for a post op check this month.   Had consultation with her ophthalmology Dr. Ananias Pilgrim regarding her dry eyes.   Dr. Ananias Pilgrim called me by phone on 04/16/15 stating that hormone therapy can help dry eyes but that the patient will not loose her sight if she stops the estrogen.  He did not write this in her chart through [facility], however.   I have shared with the patient that I am not comfortable refilling her estrogen prescription any longer. She does have refills through December, and I will not renew after this.   I have offered for her to have a second opinion through another gynecologist or specifically her GYN oncologist who has taken care of her for her uterine cancer.   Patient thanked for for calling and for my concern.  She acknowledges that she understands I really do care about her.

## 2015-05-07 ENCOUNTER — Telehealth (HOSPITAL_COMMUNITY): Payer: Self-pay

## 2015-05-07 NOTE — Telephone Encounter (Signed)
Called patient regarding entrance to Pulmonary Rehab.  Patient states that they are interested in attending the program.  [name] is going to verify insurance coverage and follow up.

## 2015-05-10 ENCOUNTER — Telehealth (HOSPITAL_COMMUNITY): Payer: Self-pay | Admitting: *Deleted

## 2015-05-28 ENCOUNTER — Telehealth: Payer: Self-pay | Admitting: Pulmonary Disease

## 2015-05-28 NOTE — Telephone Encounter (Signed)
Spoke with Shelly Mosley.  She needed another copy of EKG faxed to her, the one that was sent before was too dark to read. Faxed over copy of EKG. Shelly Mosley notified. Nothing further needed.

## 2015-06-01 DIAGNOSIS — J439 Emphysema, unspecified: Secondary | ICD-10-CM | POA: Diagnosis not present

## 2015-06-02 ENCOUNTER — Telehealth: Payer: Self-pay | Admitting: Obstetrics and Gynecology

## 2015-06-02 DIAGNOSIS — N6099 Unspecified benign mammary dysplasia of unspecified breast: Secondary | ICD-10-CM

## 2015-06-02 DIAGNOSIS — J439 Emphysema, unspecified: Secondary | ICD-10-CM | POA: Diagnosis not present

## 2015-06-02 NOTE — Telephone Encounter (Signed)
Phone call returned from patient this afternoon.   Patient has nor further surgical care planned with general surgeon.   I discussed referring her to medical oncology to discuss breast cancer risk reduction strategies. She states she had consultation many years ago with Dr. Humphrey Rolls.  She had a trial of Tamoxifen but developed terrible dry eye symptoms and stopped it.   I have offered for her to revisit with medical oncology to discuss an overall plan with her as she now has atypical hyperplasia of the breast and needs to come off her estrogen therapy, as we previously discussed.   I will put in a referral for her to see Dr. Jana Hakim to get a second opinion. Patient is in agreement with this and appreciates the assistance.

## 2015-06-02 NOTE — Telephone Encounter (Signed)
Phone call to patient.  Left message to return my call.  No details left.   My intent in calling is to discuss referral to medical oncology regarding the atypical hyperplasia of the breast noted on biopsy with Dr. Excell Seltzer.  I do not see a plan in the notes from him regarding any further breast care. There are risk reduction strategies for her including possible Tamoxifen treatment.  This can be prescribed and monitored through medical oncology.   She and I have already discussed her discontinuation of hormones.

## 2015-06-03 DIAGNOSIS — J439 Emphysema, unspecified: Secondary | ICD-10-CM | POA: Diagnosis not present

## 2015-06-04 ENCOUNTER — Telehealth: Payer: Self-pay | Admitting: *Deleted

## 2015-06-04 NOTE — Telephone Encounter (Signed)
Received referral from Dr. Elza Rafter office and I confirmed 06/21/15 appt w/ pt.  Placed a note for an intake form to be given to the pt at time of check in.

## 2015-06-07 DIAGNOSIS — Z23 Encounter for immunization: Secondary | ICD-10-CM | POA: Diagnosis not present

## 2015-06-07 DIAGNOSIS — J439 Emphysema, unspecified: Secondary | ICD-10-CM | POA: Diagnosis not present

## 2015-06-09 DIAGNOSIS — J439 Emphysema, unspecified: Secondary | ICD-10-CM | POA: Diagnosis not present

## 2015-06-10 DIAGNOSIS — J439 Emphysema, unspecified: Secondary | ICD-10-CM | POA: Diagnosis not present

## 2015-06-14 DIAGNOSIS — J439 Emphysema, unspecified: Secondary | ICD-10-CM | POA: Diagnosis not present

## 2015-06-16 DIAGNOSIS — J439 Emphysema, unspecified: Secondary | ICD-10-CM | POA: Diagnosis not present

## 2015-06-17 DIAGNOSIS — J439 Emphysema, unspecified: Secondary | ICD-10-CM | POA: Diagnosis not present

## 2015-06-18 ENCOUNTER — Other Ambulatory Visit: Payer: Self-pay

## 2015-06-18 DIAGNOSIS — C541 Malignant neoplasm of endometrium: Secondary | ICD-10-CM

## 2015-06-21 ENCOUNTER — Ambulatory Visit (HOSPITAL_BASED_OUTPATIENT_CLINIC_OR_DEPARTMENT_OTHER): Payer: Medicare Other | Admitting: Oncology

## 2015-06-21 ENCOUNTER — Other Ambulatory Visit (HOSPITAL_BASED_OUTPATIENT_CLINIC_OR_DEPARTMENT_OTHER): Payer: Medicare Other

## 2015-06-21 VITALS — BP 118/68 | HR 80 | Temp 98.0°F | Resp 18 | Ht 66.5 in | Wt 262.7 lb

## 2015-06-21 DIAGNOSIS — Z1239 Encounter for other screening for malignant neoplasm of breast: Secondary | ICD-10-CM

## 2015-06-21 DIAGNOSIS — Z803 Family history of malignant neoplasm of breast: Secondary | ICD-10-CM | POA: Diagnosis not present

## 2015-06-21 DIAGNOSIS — J439 Emphysema, unspecified: Secondary | ICD-10-CM | POA: Diagnosis not present

## 2015-06-21 DIAGNOSIS — C541 Malignant neoplasm of endometrium: Secondary | ICD-10-CM | POA: Diagnosis not present

## 2015-06-21 DIAGNOSIS — J438 Other emphysema: Secondary | ICD-10-CM

## 2015-06-21 LAB — CBC WITH DIFFERENTIAL/PLATELET
BASO%: 0.4 % (ref 0.0–2.0)
BASOS ABS: 0 10*3/uL (ref 0.0–0.1)
EOS%: 1.8 % (ref 0.0–7.0)
Eosinophils Absolute: 0.2 10*3/uL (ref 0.0–0.5)
HCT: 37.6 % (ref 34.8–46.6)
HGB: 12.4 g/dL (ref 11.6–15.9)
LYMPH#: 1.3 10*3/uL (ref 0.9–3.3)
LYMPH%: 14.9 % (ref 14.0–49.7)
MCH: 30 pg (ref 25.1–34.0)
MCHC: 33.1 g/dL (ref 31.5–36.0)
MCV: 90.8 fL (ref 79.5–101.0)
MONO#: 0.5 10*3/uL (ref 0.1–0.9)
MONO%: 5.5 % (ref 0.0–14.0)
NEUT#: 6.9 10*3/uL — ABNORMAL HIGH (ref 1.5–6.5)
NEUT%: 77.4 % — AB (ref 38.4–76.8)
PLATELETS: 269 10*3/uL (ref 145–400)
RBC: 4.14 10*6/uL (ref 3.70–5.45)
RDW: 13.1 % (ref 11.2–14.5)
WBC: 8.9 10*3/uL (ref 3.9–10.3)

## 2015-06-21 LAB — COMPREHENSIVE METABOLIC PANEL (CC13)
ALT: 50 U/L (ref 0–55)
AST: 40 U/L — ABNORMAL HIGH (ref 5–34)
Albumin: 3.7 g/dL (ref 3.5–5.0)
Alkaline Phosphatase: 167 U/L — ABNORMAL HIGH (ref 40–150)
Anion Gap: 8 mEq/L (ref 3–11)
BUN: 17.1 mg/dL (ref 7.0–26.0)
CO2: 30 mEq/L — ABNORMAL HIGH (ref 22–29)
Calcium: 10.2 mg/dL (ref 8.4–10.4)
Chloride: 100 mEq/L (ref 98–109)
Creatinine: 0.9 mg/dL (ref 0.6–1.1)
EGFR: 63 mL/min/{1.73_m2} — ABNORMAL LOW (ref >90)
Glucose: 96 mg/dl (ref 70–140)
Potassium: 4.1 mEq/L (ref 3.5–5.1)
Sodium: 138 mEq/L (ref 136–145)
Total Bilirubin: 0.43 mg/dL (ref 0.20–1.20)
Total Protein: 7.2 g/dL (ref 6.4–8.3)

## 2015-06-21 MED ORDER — RALOXIFENE HCL 60 MG PO TABS: 60.0000 mg | ORAL_TABLET | Freq: Every day | ORAL | Status: DC
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
## 2015-06-22 ENCOUNTER — Telehealth: Payer: Self-pay | Admitting: Oncology

## 2015-06-22 NOTE — Telephone Encounter (Signed)
Called patient and she is aware of her appointments and to call for her mri   anne

## 2015-06-23 ENCOUNTER — Other Ambulatory Visit: Payer: Self-pay | Admitting: *Deleted

## 2015-06-23 DIAGNOSIS — C541 Malignant neoplasm of endometrium: Secondary | ICD-10-CM

## 2015-06-23 DIAGNOSIS — J439 Emphysema, unspecified: Secondary | ICD-10-CM | POA: Diagnosis not present

## 2015-06-23 DIAGNOSIS — Z1239 Encounter for other screening for malignant neoplasm of breast: Secondary | ICD-10-CM

## 2015-06-24 DIAGNOSIS — J439 Emphysema, unspecified: Secondary | ICD-10-CM | POA: Diagnosis not present

## 2015-06-24 DIAGNOSIS — E789 Disorder of lipoprotein metabolism, unspecified: Secondary | ICD-10-CM | POA: Diagnosis not present

## 2015-06-24 DIAGNOSIS — E0789 Other specified disorders of thyroid: Secondary | ICD-10-CM | POA: Diagnosis not present

## 2015-06-28 DIAGNOSIS — J439 Emphysema, unspecified: Secondary | ICD-10-CM | POA: Diagnosis not present

## 2015-06-30 DIAGNOSIS — J439 Emphysema, unspecified: Secondary | ICD-10-CM | POA: Diagnosis not present

## 2015-07-01 DIAGNOSIS — E039 Hypothyroidism, unspecified: Secondary | ICD-10-CM | POA: Diagnosis not present

## 2015-07-01 DIAGNOSIS — E789 Disorder of lipoprotein metabolism, unspecified: Secondary | ICD-10-CM | POA: Diagnosis not present

## 2015-07-05 DIAGNOSIS — J439 Emphysema, unspecified: Secondary | ICD-10-CM | POA: Diagnosis not present

## 2015-07-07 DIAGNOSIS — J439 Emphysema, unspecified: Secondary | ICD-10-CM | POA: Diagnosis not present

## 2015-07-08 DIAGNOSIS — J439 Emphysema, unspecified: Secondary | ICD-10-CM | POA: Diagnosis not present

## 2015-07-12 DIAGNOSIS — J439 Emphysema, unspecified: Secondary | ICD-10-CM | POA: Diagnosis not present

## 2015-07-14 DIAGNOSIS — J439 Emphysema, unspecified: Secondary | ICD-10-CM | POA: Diagnosis not present

## 2015-07-19 DIAGNOSIS — J439 Emphysema, unspecified: Secondary | ICD-10-CM | POA: Diagnosis not present

## 2015-07-20 ENCOUNTER — Ambulatory Visit
Admission: RE | Admit: 2015-07-20 | Discharge: 2015-07-20 | Disposition: A | Discharge status: Home / Self Care | Payer: Medicare Other | Source: Ambulatory Visit | Attending: Oncology | Admitting: Oncology

## 2015-07-20 ENCOUNTER — Other Ambulatory Visit: Payer: Medicare Other

## 2015-07-20 DIAGNOSIS — Z1239 Encounter for other screening for malignant neoplasm of breast: Secondary | ICD-10-CM

## 2015-07-20 DIAGNOSIS — C541 Malignant neoplasm of endometrium: Secondary | ICD-10-CM

## 2015-07-20 DIAGNOSIS — N6489 Other specified disorders of breast: Secondary | ICD-10-CM | POA: Diagnosis not present

## 2015-07-20 MED ORDER — GADOBENATE DIMEGLUMINE 529 MG/ML IV SOLN
20.0000 mL | Freq: Once | INTRAVENOUS | Status: AC | PRN
  Administered 2015-07-20: 20 mL via INTRAVENOUS

## 2015-07-21 DIAGNOSIS — J439 Emphysema, unspecified: Secondary | ICD-10-CM | POA: Diagnosis not present

## 2015-07-22 DIAGNOSIS — J439 Emphysema, unspecified: Secondary | ICD-10-CM | POA: Diagnosis not present

## 2015-07-26 DIAGNOSIS — J439 Emphysema, unspecified: Secondary | ICD-10-CM | POA: Diagnosis not present

## 2015-07-27 DIAGNOSIS — H2513 Age-related nuclear cataract, bilateral: Secondary | ICD-10-CM | POA: Diagnosis not present

## 2015-07-27 DIAGNOSIS — H16229 Keratoconjunctivitis sicca, not specified as Sjogren's, unspecified eye: Secondary | ICD-10-CM | POA: Diagnosis not present

## 2015-07-28 DIAGNOSIS — J439 Emphysema, unspecified: Secondary | ICD-10-CM | POA: Diagnosis not present

## 2015-07-29 DIAGNOSIS — J439 Emphysema, unspecified: Secondary | ICD-10-CM | POA: Diagnosis not present

## 2015-08-02 DIAGNOSIS — J439 Emphysema, unspecified: Secondary | ICD-10-CM | POA: Diagnosis not present

## 2015-08-04 DIAGNOSIS — K045 Chronic apical periodontitis: Secondary | ICD-10-CM | POA: Diagnosis not present

## 2015-08-04 DIAGNOSIS — M272 Inflammatory conditions of jaws: Secondary | ICD-10-CM | POA: Diagnosis not present

## 2015-08-04 DIAGNOSIS — J439 Emphysema, unspecified: Secondary | ICD-10-CM | POA: Diagnosis not present

## 2015-08-05 DIAGNOSIS — J439 Emphysema, unspecified: Secondary | ICD-10-CM | POA: Diagnosis not present

## 2015-08-09 DIAGNOSIS — J439 Emphysema, unspecified: Secondary | ICD-10-CM | POA: Diagnosis not present

## 2015-08-11 DIAGNOSIS — J439 Emphysema, unspecified: Secondary | ICD-10-CM | POA: Diagnosis not present

## 2015-08-12 DIAGNOSIS — J439 Emphysema, unspecified: Secondary | ICD-10-CM | POA: Diagnosis not present

## 2015-08-18 DIAGNOSIS — J439 Emphysema, unspecified: Secondary | ICD-10-CM | POA: Diagnosis not present

## 2015-08-19 DIAGNOSIS — J439 Emphysema, unspecified: Secondary | ICD-10-CM | POA: Diagnosis not present

## 2015-08-23 DIAGNOSIS — J439 Emphysema, unspecified: Secondary | ICD-10-CM | POA: Diagnosis not present

## 2015-08-25 ENCOUNTER — Ambulatory Visit (INDEPENDENT_AMBULATORY_CARE_PROVIDER_SITE_OTHER): Payer: Medicare Other | Admitting: Obstetrics and Gynecology

## 2015-08-25 ENCOUNTER — Encounter: Payer: Self-pay | Admitting: Obstetrics and Gynecology

## 2015-08-25 VITALS — BP 108/66 | HR 76 | Resp 18 | Ht 67.0 in | Wt 266.0 lb

## 2015-08-25 DIAGNOSIS — N3946 Mixed incontinence: Secondary | ICD-10-CM | POA: Diagnosis not present

## 2015-08-25 DIAGNOSIS — Z01419 Encounter for gynecological examination (general) (routine) without abnormal findings: Secondary | ICD-10-CM | POA: Diagnosis not present

## 2015-08-25 DIAGNOSIS — J439 Emphysema, unspecified: Secondary | ICD-10-CM | POA: Diagnosis not present

## 2015-08-25 DIAGNOSIS — N951 Menopausal and female climacteric states: Secondary | ICD-10-CM | POA: Diagnosis not present

## 2015-08-25 DIAGNOSIS — Z124 Encounter for screening for malignant neoplasm of cervix: Secondary | ICD-10-CM

## 2015-08-25 DIAGNOSIS — Z8542 Personal history of malignant neoplasm of other parts of uterus: Secondary | ICD-10-CM

## 2015-08-25 DIAGNOSIS — Z1272 Encounter for screening for malignant neoplasm of vagina: Secondary | ICD-10-CM | POA: Diagnosis not present
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
## 2015-08-25 NOTE — Patient Instructions (Signed)

## 2015-08-26 LAB — IPS PAP SMEAR ONLY

## 2015-08-28 ENCOUNTER — Other Ambulatory Visit: Payer: Self-pay | Admitting: Oncology

## 2015-08-31 ENCOUNTER — Telehealth: Payer: Self-pay

## 2015-08-31 DIAGNOSIS — Z0000 Encounter for general adult medical examination without abnormal findings: Secondary | ICD-10-CM | POA: Diagnosis not present

## 2015-08-31 DIAGNOSIS — E559 Vitamin D deficiency, unspecified: Secondary | ICD-10-CM | POA: Diagnosis not present

## 2015-08-31 DIAGNOSIS — E0789 Other specified disorders of thyroid: Secondary | ICD-10-CM | POA: Diagnosis not present

## 2015-08-31 DIAGNOSIS — E789 Disorder of lipoprotein metabolism, unspecified: Secondary | ICD-10-CM | POA: Diagnosis not present

## 2015-08-31 DIAGNOSIS — I10 Essential (primary) hypertension: Secondary | ICD-10-CM | POA: Diagnosis not present

## 2015-08-31 NOTE — Telephone Encounter (Signed)
Spoke with patient. Advised of results as seen below from [place]. Patient is agreeable and verbalizes understanding. 08 recall placed. Patient is asking about referral to urology. Advised a referral has been placed to [facility] for patient to see Dr.MacDiarmid. Advised I will check on the status of this referral and help to get this scheduled. Patient prefers morning appointments. Advised I will return call with appointment date and time. Patient is agreeable.

## 2015-08-31 NOTE — Telephone Encounter (Signed)
-----   Message from Nunzio Cobbs, MD sent at 08/26/2015  8:13 PM EST ----- Please inform patient that her pap is normal. Please place in recall - 08 due to history of endometrial cancer.  Please let her know that I have sent the message to Dr. Jana Hakim about stopping the Evista and starting back on estrogen treatment.  I am awaiting his response.

## 2015-08-31 NOTE — Telephone Encounter (Signed)
Appointment scheduled with Dr.MacDiarmid on February 9th at 8 am. OV notes from 08/25/2015 and demographics faxed to [facility] at [phone] with cover sheet and confirmation. Left message for patient to call [place] at [phone].

## 2015-09-01 NOTE — Telephone Encounter (Signed)
Spoke with patient. Advised of appointment date and time scheduled at [facility] with Dr.MacDiarmid for 09/30/2015 at 8 am. Patient is agreeable to date and time. Address and telephone number provided to [facility] for patient.  Routing to provider for final review. Patient agreeable to disposition. Will close encounter.

## 2015-09-07 DIAGNOSIS — I10 Essential (primary) hypertension: Secondary | ICD-10-CM | POA: Diagnosis not present

## 2015-09-07 DIAGNOSIS — E789 Disorder of lipoprotein metabolism, unspecified: Secondary | ICD-10-CM | POA: Diagnosis not present

## 2015-09-07 DIAGNOSIS — R0602 Shortness of breath: Secondary | ICD-10-CM | POA: Diagnosis not present

## 2015-09-07 DIAGNOSIS — E032 Hypothyroidism due to medicaments and other exogenous substances: Secondary | ICD-10-CM | POA: Diagnosis not present

## 2015-09-13 ENCOUNTER — Telehealth: Payer: Self-pay | Admitting: Obstetrics and Gynecology

## 2015-09-13 NOTE — Telephone Encounter (Signed)
Phone call in follow up to patient's request to stop taking Evista and start taking her ERT again.   Message machine answered with a recording saying it was for "Shelly Mosley."  No phone message left.  I simply hung up before anything was recorded.   I will ask patient to see medical oncology if she chooses to return to her estrogen therapy.

## 2015-09-21 ENCOUNTER — Telehealth: Payer: Self-pay | Admitting: Emergency Medicine

## 2015-09-21 NOTE — Telephone Encounter (Signed)
Message left to return call to [place] at [phone]. Voicemail confirms "Jana Half" This is patient's sister that she lives with and same phone number listed as sister on designated party release form.

## 2015-09-21 NOTE — Telephone Encounter (Signed)
-----   Message from Nunzio Cobbs, MD sent at 09/20/2015  7:52 AM EST ----- Regarding: Please contact patient to have her see Dr. Jana Hakim Please contact patient and ask her to make an appointment with Dr. Jana Hakim again.  She and I have been discussing her estrogen therapy, and I am not comfortable prescribing this for her again after my review of the literature.  She is aware that I have not been comfortable with this.  She is currently off estrogen.   She has been seeing Dr. Jana Hakim for consultation regarding reduction of risk of breast cancer due to her family history and personal history of atypical breast hyperplasia.  He placed her on Evista, and she does not like the side effects.  She is also an endometrial cancer survivor.  She has dry eye and has stated she needs this for protection of her vision.  She has had consultation regarding this.   At this point, I am going to ask her to have these conversations directly with Dr. Jana Hakim.    I invite her to make an appointment with him.  She is already established recently, and she may call to make the appointment.   Thank you,   Josefa Half

## 2015-09-22 NOTE — Telephone Encounter (Signed)
Call to patient. She is given message from Dr. Quincy Simmonds.  She states she has discussed estrogen with Dr. Jana Hakim. I advised her that Dr. Quincy Simmonds will not be placing a prescription for her. She states "is she just scared?" and I advised that our intention is to provide her the most safe care that is recommended by research and evidence. Patient states "I don't think so, I think she is just scared. Thank you for calling." Patient ended phone call.   Final message to Dr. Quincy Simmonds and encounter is closed.

## 2015-09-22 NOTE — Telephone Encounter (Signed)
Patient is returning a call to [place]. °

## 2015-09-28 ENCOUNTER — Encounter: Payer: Self-pay | Admitting: Pulmonary Disease

## 2015-09-28 ENCOUNTER — Ambulatory Visit (INDEPENDENT_AMBULATORY_CARE_PROVIDER_SITE_OTHER): Payer: Medicare Other | Admitting: Pulmonary Disease

## 2015-09-28 VITALS — BP 132/76 | HR 65 | Ht 66.75 in | Wt 269.8 lb

## 2015-09-28 DIAGNOSIS — J438 Other emphysema: Secondary | ICD-10-CM | POA: Diagnosis not present

## 2015-09-28 DIAGNOSIS — J309 Allergic rhinitis, unspecified: Secondary | ICD-10-CM

## 2015-09-28 MED ORDER — ALBUTEROL SULFATE HFA 108 (90 BASE) MCG/ACT IN AERS: 1.0000 | INHALATION_SPRAY | RESPIRATORY_TRACT | Status: DC | PRN

## 2015-09-28 NOTE — Patient Instructions (Addendum)
We will prescribe for you an albuterol inhaler that you may use 1-2 puffs up to every four hours as needed for shortness of breath, wheezing. You may try over the counter Flonase. Start at 1 spray each nostril daily. Continue to try to stay active. Get your flu shot in the fall. Follow up with Dr. Lake Bells in 6 months or sooner.
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
## 2015-09-28 NOTE — Assessment & Plan Note (Addendum)
Stable. She has moderate airflow obstruction. Some dyspnea due to chronic rib pain. Pain is controlled by tramadol and heat. She has been exercising regularly. Last used albuterol 1 month ago. She has not used her oxygen since December.  Plan: Continue albuterol as she is doing. Prescribed inhaler she can use in addition to her nebulizer. Discontinue supplemental oxygen. Flu shot in the fall Follow up with Dr. Lake Bells in 6 months or sooner if needed.

## 2015-09-28 NOTE — Assessment & Plan Note (Signed)
Nasal congestion and post nasal drip chronically. She has cough in the mornings likely secondary to this. Takes Zyrtec daily.  Plan: Continue Zyrtec daily Start OTC Flonase 1 spray each nostril daily

## 2015-09-30 DIAGNOSIS — R351 Nocturia: Secondary | ICD-10-CM | POA: Diagnosis not present

## 2015-09-30 DIAGNOSIS — N3944 Nocturnal enuresis: Secondary | ICD-10-CM | POA: Diagnosis not present

## 2015-09-30 DIAGNOSIS — N3946 Mixed incontinence: Secondary | ICD-10-CM | POA: Diagnosis not present

## 2015-09-30 DIAGNOSIS — Z0000 Encounter for general adult medical examination without abnormal findings: Secondary | ICD-10-CM | POA: Diagnosis not present

## 2015-10-07 DIAGNOSIS — F330 Major depressive disorder, recurrent, mild: Secondary | ICD-10-CM | POA: Diagnosis not present

## 2015-10-07 DIAGNOSIS — F451 Undifferentiated somatoform disorder: Secondary | ICD-10-CM | POA: Diagnosis not present

## 2015-10-13 DIAGNOSIS — L820 Inflamed seborrheic keratosis: Secondary | ICD-10-CM | POA: Diagnosis not present

## 2015-10-13 DIAGNOSIS — L218 Other seborrheic dermatitis: Secondary | ICD-10-CM | POA: Diagnosis not present

## 2015-10-15 ENCOUNTER — Encounter: Payer: Self-pay | Admitting: Gynecologic Oncology

## 2015-10-20 ENCOUNTER — Encounter: Payer: Self-pay | Admitting: Gynecologic Oncology

## 2015-11-01 ENCOUNTER — Encounter: Payer: Self-pay | Admitting: Gynecologic Oncology

## 2015-11-03 DIAGNOSIS — R351 Nocturia: Secondary | ICD-10-CM | POA: Diagnosis not present

## 2015-11-03 DIAGNOSIS — N3946 Mixed incontinence: Secondary | ICD-10-CM | POA: Diagnosis not present

## 2015-11-03 DIAGNOSIS — E0789 Other specified disorders of thyroid: Secondary | ICD-10-CM | POA: Diagnosis not present

## 2015-11-03 DIAGNOSIS — Z0000 Encounter for general adult medical examination without abnormal findings: Secondary | ICD-10-CM | POA: Diagnosis not present

## 2015-11-03 DIAGNOSIS — E789 Disorder of lipoprotein metabolism, unspecified: Secondary | ICD-10-CM | POA: Diagnosis not present

## 2015-11-03 DIAGNOSIS — N3944 Nocturnal enuresis: Secondary | ICD-10-CM | POA: Diagnosis not present

## 2015-11-03 DIAGNOSIS — E559 Vitamin D deficiency, unspecified: Secondary | ICD-10-CM | POA: Diagnosis not present

## 2015-11-03 DIAGNOSIS — I10 Essential (primary) hypertension: Secondary | ICD-10-CM | POA: Diagnosis not present

## 2015-11-10 DIAGNOSIS — N3946 Mixed incontinence: Secondary | ICD-10-CM | POA: Diagnosis not present

## 2015-11-16 DIAGNOSIS — I10 Essential (primary) hypertension: Secondary | ICD-10-CM | POA: Diagnosis not present

## 2015-11-16 DIAGNOSIS — E789 Disorder of lipoprotein metabolism, unspecified: Secondary | ICD-10-CM | POA: Diagnosis not present

## 2015-11-16 DIAGNOSIS — E032 Hypothyroidism due to medicaments and other exogenous substances: Secondary | ICD-10-CM | POA: Diagnosis not present

## 2015-11-16 DIAGNOSIS — R5383 Other fatigue: Secondary | ICD-10-CM | POA: Diagnosis not present

## 2015-11-22 ENCOUNTER — Encounter: Payer: Self-pay | Admitting: Gynecologic Oncology

## 2015-11-22 ENCOUNTER — Ambulatory Visit: Payer: Medicare Other | Attending: Gynecologic Oncology | Admitting: Gynecologic Oncology

## 2015-11-22 ENCOUNTER — Other Ambulatory Visit (HOSPITAL_BASED_OUTPATIENT_CLINIC_OR_DEPARTMENT_OTHER): Payer: Medicare Other

## 2015-11-22 VITALS — BP 100/41 | HR 56 | Temp 98.0°F | Resp 18 | Ht 65.75 in | Wt 268.8 lb

## 2015-11-22 DIAGNOSIS — Z803 Family history of malignant neoplasm of breast: Secondary | ICD-10-CM

## 2015-11-22 DIAGNOSIS — M1990 Unspecified osteoarthritis, unspecified site: Secondary | ICD-10-CM | POA: Insufficient documentation

## 2015-11-22 DIAGNOSIS — F329 Major depressive disorder, single episode, unspecified: Secondary | ICD-10-CM | POA: Insufficient documentation

## 2015-11-22 DIAGNOSIS — E039 Hypothyroidism, unspecified: Secondary | ICD-10-CM | POA: Diagnosis not present

## 2015-11-22 DIAGNOSIS — G629 Polyneuropathy, unspecified: Secondary | ICD-10-CM | POA: Diagnosis not present

## 2015-11-22 DIAGNOSIS — G8929 Other chronic pain: Secondary | ICD-10-CM | POA: Insufficient documentation

## 2015-11-22 DIAGNOSIS — M797 Fibromyalgia: Secondary | ICD-10-CM | POA: Insufficient documentation

## 2015-11-22 DIAGNOSIS — K219 Gastro-esophageal reflux disease without esophagitis: Secondary | ICD-10-CM | POA: Insufficient documentation

## 2015-11-22 DIAGNOSIS — C541 Malignant neoplasm of endometrium: Secondary | ICD-10-CM

## 2015-11-22 DIAGNOSIS — Z79890 Hormone replacement therapy (postmenopausal): Secondary | ICD-10-CM | POA: Insufficient documentation

## 2015-11-22 DIAGNOSIS — J449 Chronic obstructive pulmonary disease, unspecified: Secondary | ICD-10-CM | POA: Diagnosis not present

## 2015-11-22 DIAGNOSIS — F419 Anxiety disorder, unspecified: Secondary | ICD-10-CM | POA: Insufficient documentation

## 2015-11-22 DIAGNOSIS — N62 Hypertrophy of breast: Secondary | ICD-10-CM | POA: Diagnosis not present

## 2015-11-22 DIAGNOSIS — I890 Lymphedema, not elsewhere classified: Secondary | ICD-10-CM | POA: Insufficient documentation

## 2015-11-22 DIAGNOSIS — Z8542 Personal history of malignant neoplasm of other parts of uterus: Secondary | ICD-10-CM | POA: Diagnosis not present

## 2015-11-22 DIAGNOSIS — E785 Hyperlipidemia, unspecified: Secondary | ICD-10-CM | POA: Insufficient documentation

## 2015-11-22 DIAGNOSIS — I10 Essential (primary) hypertension: Secondary | ICD-10-CM | POA: Insufficient documentation

## 2015-11-22 DIAGNOSIS — E669 Obesity, unspecified: Secondary | ICD-10-CM | POA: Insufficient documentation

## 2015-11-22 DIAGNOSIS — R102 Pelvic and perineal pain: Secondary | ICD-10-CM | POA: Diagnosis not present

## 2015-11-22 LAB — BUN AND CREATININE (CC13)
BUN: 17.6 mg/dL (ref 7.0–26.0)
Creatinine: 0.8 mg/dL (ref 0.6–1.1)
EGFR: 78 mL/min/{1.73_m2} — AB (ref >90)
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
## 2015-11-22 NOTE — Addendum Note (Signed)
Addended by: Joylene John D on: 11/22/2015 09:54 AM   Modules accepted: Orders

## 2015-11-22 NOTE — Patient Instructions (Signed)
Plan to have a CT scan of the abdomen and pelvis.  Plan to follow up with Dr. Denman George in six months or sooner if needed if everything is clear on the CT scan.  Please call for any questions or concerns.

## 2015-11-29 ENCOUNTER — Encounter (HOSPITAL_COMMUNITY): Payer: Self-pay

## 2015-11-29 ENCOUNTER — Ambulatory Visit (HOSPITAL_COMMUNITY): Payer: Medicare Other

## 2015-11-29 ENCOUNTER — Ambulatory Visit (HOSPITAL_COMMUNITY)
Admission: RE | Admit: 2015-11-29 | Discharge: 2015-11-29 | Disposition: A | Discharge status: Home / Self Care | Payer: Medicare Other | Source: Ambulatory Visit | Attending: Gynecologic Oncology | Admitting: Gynecologic Oncology

## 2015-11-29 ENCOUNTER — Other Ambulatory Visit: Payer: Medicare Other

## 2015-11-29 DIAGNOSIS — K838 Other specified diseases of biliary tract: Secondary | ICD-10-CM | POA: Insufficient documentation

## 2015-11-29 DIAGNOSIS — Z90710 Acquired absence of both cervix and uterus: Secondary | ICD-10-CM | POA: Diagnosis not present

## 2015-11-29 DIAGNOSIS — C541 Malignant neoplasm of endometrium: Secondary | ICD-10-CM | POA: Diagnosis not present

## 2015-11-29 DIAGNOSIS — Z9049 Acquired absence of other specified parts of digestive tract: Secondary | ICD-10-CM | POA: Diagnosis not present

## 2015-11-29 DIAGNOSIS — R918 Other nonspecific abnormal finding of lung field: Secondary | ICD-10-CM | POA: Diagnosis not present

## 2015-11-29 DIAGNOSIS — I700 Atherosclerosis of aorta: Secondary | ICD-10-CM | POA: Insufficient documentation

## 2015-11-29 DIAGNOSIS — J9811 Atelectasis: Secondary | ICD-10-CM | POA: Diagnosis not present

## 2015-11-29 HISTORY — DX: Reserved for concepts with insufficient information to code with codable children: IMO0002

## 2015-11-29 MED ORDER — IOPAMIDOL (ISOVUE-300) INJECTION 61%
100.0000 mL | Freq: Once | INTRAVENOUS | Status: AC | PRN
  Administered 2015-11-29: 100 mL via INTRAVENOUS

## 2015-12-07 ENCOUNTER — Telehealth: Payer: Self-pay | Admitting: Gynecologic Oncology

## 2015-12-07 NOTE — Telephone Encounter (Signed)
Informed patient of CT scan results.  Advised we would let her know if Dr. Denman George wanted her to have follow up imaging due to the new renal lesion.  All questions answered.

## 2015-12-09 ENCOUNTER — Other Ambulatory Visit: Payer: Self-pay

## 2015-12-09 ENCOUNTER — Telehealth: Payer: Self-pay

## 2015-12-09 DIAGNOSIS — N3946 Mixed incontinence: Secondary | ICD-10-CM | POA: Diagnosis not present

## 2015-12-09 DIAGNOSIS — N289 Disorder of kidney and ureter, unspecified: Secondary | ICD-10-CM

## 2015-12-09 DIAGNOSIS — R351 Nocturia: Secondary | ICD-10-CM | POA: Diagnosis not present

## 2015-12-09 DIAGNOSIS — Z0000 Encounter for general adult medical examination without abnormal findings: Secondary | ICD-10-CM | POA: Diagnosis not present

## 2015-12-09 NOTE — Telephone Encounter (Signed)
Patient contcated to update with appointments being scheduled for labs 09/27 , CT Scan follow up on left renal lesion noted on last CT scan and follow up with Dr Everitt Amber on May 22, 2016 . Patient instructed to pick up oral contrast for scheduled CT scan the day she has her labs drawn. Patient also instructed to arrive 15 min prior to CT scan and to have nothing by mouth 4 hours before the scan. Patient states understanding , denies further questions at this time , will call with any changes or concerns.

## 2015-12-15 DIAGNOSIS — E032 Hypothyroidism due to medicaments and other exogenous substances: Secondary | ICD-10-CM | POA: Diagnosis not present

## 2015-12-15 DIAGNOSIS — E559 Vitamin D deficiency, unspecified: Secondary | ICD-10-CM | POA: Diagnosis not present

## 2015-12-22 DIAGNOSIS — C541 Malignant neoplasm of endometrium: Secondary | ICD-10-CM | POA: Diagnosis not present

## 2015-12-22 DIAGNOSIS — E789 Disorder of lipoprotein metabolism, unspecified: Secondary | ICD-10-CM | POA: Diagnosis not present

## 2015-12-22 DIAGNOSIS — E032 Hypothyroidism due to medicaments and other exogenous substances: Secondary | ICD-10-CM | POA: Diagnosis not present

## 2015-12-22 DIAGNOSIS — I10 Essential (primary) hypertension: Secondary | ICD-10-CM | POA: Diagnosis not present

## 2016-01-21 DIAGNOSIS — L820 Inflamed seborrheic keratosis: Secondary | ICD-10-CM | POA: Diagnosis not present

## 2016-01-21 DIAGNOSIS — L218 Other seborrheic dermatitis: Secondary | ICD-10-CM | POA: Diagnosis not present

## 2016-03-07 DIAGNOSIS — H6690 Otitis media, unspecified, unspecified ear: Secondary | ICD-10-CM | POA: Diagnosis not present

## 2016-03-21 DIAGNOSIS — Z853 Personal history of malignant neoplasm of breast: Secondary | ICD-10-CM | POA: Diagnosis not present

## 2016-03-30 DIAGNOSIS — F330 Major depressive disorder, recurrent, mild: Secondary | ICD-10-CM | POA: Diagnosis not present

## 2016-03-30 DIAGNOSIS — F341 Dysthymic disorder: Secondary | ICD-10-CM | POA: Diagnosis not present

## 2016-03-30 DIAGNOSIS — F451 Undifferentiated somatoform disorder: Secondary | ICD-10-CM | POA: Diagnosis not present

## 2016-04-04 ENCOUNTER — Encounter: Payer: Self-pay | Admitting: Obstetrics and Gynecology

## 2016-04-11 ENCOUNTER — Ambulatory Visit: Payer: Medicare Other | Admitting: Pulmonary Disease

## 2016-04-18 ENCOUNTER — Ambulatory Visit (INDEPENDENT_AMBULATORY_CARE_PROVIDER_SITE_OTHER)
Admission: RE | Admit: 2016-04-18 | Discharge: 2016-04-18 | Disposition: A | Discharge status: Home / Self Care | Payer: Medicare Other | Source: Ambulatory Visit | Attending: Pulmonary Disease | Admitting: Pulmonary Disease

## 2016-04-18 ENCOUNTER — Encounter: Payer: Self-pay | Admitting: Pulmonary Disease

## 2016-04-18 ENCOUNTER — Ambulatory Visit (INDEPENDENT_AMBULATORY_CARE_PROVIDER_SITE_OTHER): Payer: Medicare Other | Admitting: Pulmonary Disease

## 2016-04-18 DIAGNOSIS — Z79899 Other long term (current) drug therapy: Secondary | ICD-10-CM | POA: Diagnosis not present

## 2016-04-18 DIAGNOSIS — R0600 Dyspnea, unspecified: Secondary | ICD-10-CM | POA: Diagnosis not present

## 2016-04-18 DIAGNOSIS — I10 Essential (primary) hypertension: Secondary | ICD-10-CM | POA: Diagnosis not present

## 2016-04-18 DIAGNOSIS — R0602 Shortness of breath: Secondary | ICD-10-CM | POA: Diagnosis not present

## 2016-04-18 DIAGNOSIS — J438 Other emphysema: Secondary | ICD-10-CM

## 2016-04-18 DIAGNOSIS — E032 Hypothyroidism due to medicaments and other exogenous substances: Secondary | ICD-10-CM | POA: Diagnosis not present

## 2016-04-18 DIAGNOSIS — E0789 Other specified disorders of thyroid: Secondary | ICD-10-CM | POA: Diagnosis not present

## 2016-04-18 DIAGNOSIS — E559 Vitamin D deficiency, unspecified: Secondary | ICD-10-CM | POA: Diagnosis not present

## 2016-04-18 DIAGNOSIS — R05 Cough: Secondary | ICD-10-CM | POA: Diagnosis not present

## 2016-04-18 MED ORDER — AEROCHAMBER MV MISC: 0 refills | Status: DC

## 2016-04-18 NOTE — Assessment & Plan Note (Signed)
I believe that her dyspnea is due to deconditioning and obesity primarily but she has been working hard going to the gym recently.  Plan: Check chest x-ray to ensure there is nothing else going on given the progression of dyspnea recently Rest as above

## 2016-04-18 NOTE — Addendum Note (Signed)
Addended by: Collier Salina on: 04/18/2016 11:36 AM   Modules accepted: Orders

## 2016-04-18 NOTE — Assessment & Plan Note (Signed)
She has been struggling with worsening dyspnea recently. I believe this is a multifactorial problem but her untreated COPD certainly contributes. She wants to try an inhaler again to see if it will help with her dyspnea despite the issues with her glaucoma. My recommendation is for her to use half of the usual starting dose given her issues with glaucoma.  Plan: Flu shot recommended, she will get it in October with her primary care physician Sample of Advair HFA medium dose given today recommended 1 puff twice a day, will call in prescription if she has benefit, instructed her to call us after 1 week to let us know if she thinks it's helping Continue albuterol as needed Exercise regularly

## 2016-04-18 NOTE — Patient Instructions (Addendum)
Use Advair 1 puff twice a day (not 2) and then call us to let us know if it is helping so we can prescribe Exercise regularly, even on days when you do not go to the gym Get a flu shot in October I will see you back in 3 months or sooner if needed
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
## 2016-04-21 ENCOUNTER — Telehealth: Payer: Self-pay | Admitting: Pulmonary Disease

## 2016-04-21 NOTE — Telephone Encounter (Signed)
Notes Recorded by Juanito Doom, MD on 04/19/2016 at 1:20 PM EDT A, Please let the patient know this showed findings consistent with her COPD, but nothing else. Thanks, B ------------- Spoke with pt, aware of results/recs.  Nothing further needed.

## 2016-04-27 DIAGNOSIS — I10 Essential (primary) hypertension: Secondary | ICD-10-CM | POA: Diagnosis not present

## 2016-04-27 DIAGNOSIS — E789 Disorder of lipoprotein metabolism, unspecified: Secondary | ICD-10-CM | POA: Diagnosis not present

## 2016-04-27 DIAGNOSIS — R0602 Shortness of breath: Secondary | ICD-10-CM | POA: Diagnosis not present

## 2016-05-17 ENCOUNTER — Other Ambulatory Visit (HOSPITAL_BASED_OUTPATIENT_CLINIC_OR_DEPARTMENT_OTHER): Payer: Medicare Other

## 2016-05-17 DIAGNOSIS — N289 Disorder of kidney and ureter, unspecified: Secondary | ICD-10-CM | POA: Diagnosis present

## 2016-05-17 LAB — BASIC METABOLIC PANEL
Anion Gap: 11 mEq/L (ref 3–11)
BUN: 20.6 mg/dL (ref 7.0–26.0)
CO2: 28 mEq/L (ref 22–29)
CREATININE: 0.8 mg/dL (ref 0.6–1.1)
Calcium: 9.7 mg/dL (ref 8.4–10.4)
Chloride: 100 mEq/L (ref 98–109)
EGFR: 71 mL/min/{1.73_m2} — AB (ref >90)
Glucose: 87 mg/dl (ref 70–140)
POTASSIUM: 4.6 meq/L (ref 3.5–5.1)
SODIUM: 139 meq/L (ref 136–145)

## 2016-05-18 ENCOUNTER — Ambulatory Visit (HOSPITAL_COMMUNITY)
Admission: RE | Admit: 2016-05-18 | Discharge: 2016-05-18 | Disposition: A | Discharge status: Home / Self Care | Payer: Medicare Other | Source: Ambulatory Visit | Attending: Gynecologic Oncology | Admitting: Gynecologic Oncology

## 2016-05-18 DIAGNOSIS — K760 Fatty (change of) liver, not elsewhere classified: Secondary | ICD-10-CM | POA: Diagnosis not present

## 2016-05-18 DIAGNOSIS — N289 Disorder of kidney and ureter, unspecified: Secondary | ICD-10-CM | POA: Diagnosis not present

## 2016-05-18 DIAGNOSIS — Z90722 Acquired absence of ovaries, bilateral: Secondary | ICD-10-CM | POA: Insufficient documentation

## 2016-05-18 DIAGNOSIS — I2510 Atherosclerotic heart disease of native coronary artery without angina pectoris: Secondary | ICD-10-CM | POA: Diagnosis not present

## 2016-05-18 DIAGNOSIS — N2889 Other specified disorders of kidney and ureter: Secondary | ICD-10-CM | POA: Diagnosis not present

## 2016-05-18 DIAGNOSIS — R918 Other nonspecific abnormal finding of lung field: Secondary | ICD-10-CM | POA: Diagnosis not present

## 2016-05-18 DIAGNOSIS — Z90710 Acquired absence of both cervix and uterus: Secondary | ICD-10-CM | POA: Insufficient documentation

## 2016-05-18 DIAGNOSIS — Z9079 Acquired absence of other genital organ(s): Secondary | ICD-10-CM | POA: Diagnosis not present

## 2016-05-18 MED ORDER — IOPAMIDOL (ISOVUE-300) INJECTION 61%
100.0000 mL | Freq: Once | INTRAVENOUS | Status: AC | PRN
  Administered 2016-05-18: 100 mL via INTRAVENOUS

## 2016-05-22 ENCOUNTER — Encounter: Payer: Self-pay | Admitting: Gynecologic Oncology

## 2016-05-22 ENCOUNTER — Ambulatory Visit: Payer: Medicare Other | Attending: Gynecologic Oncology | Admitting: Gynecologic Oncology

## 2016-05-22 VITALS — BP 116/73 | HR 78 | Temp 97.7°F | Resp 18 | Ht 65.75 in | Wt 276.0 lb

## 2016-05-22 DIAGNOSIS — M797 Fibromyalgia: Secondary | ICD-10-CM | POA: Diagnosis not present

## 2016-05-22 DIAGNOSIS — I890 Lymphedema, not elsewhere classified: Secondary | ICD-10-CM | POA: Diagnosis not present

## 2016-05-22 DIAGNOSIS — Z803 Family history of malignant neoplasm of breast: Secondary | ICD-10-CM | POA: Insufficient documentation

## 2016-05-22 DIAGNOSIS — J439 Emphysema, unspecified: Secondary | ICD-10-CM | POA: Insufficient documentation

## 2016-05-22 DIAGNOSIS — Z90710 Acquired absence of both cervix and uterus: Secondary | ICD-10-CM | POA: Diagnosis not present

## 2016-05-22 DIAGNOSIS — I10 Essential (primary) hypertension: Secondary | ICD-10-CM | POA: Diagnosis not present

## 2016-05-22 DIAGNOSIS — G629 Polyneuropathy, unspecified: Secondary | ICD-10-CM | POA: Diagnosis not present

## 2016-05-22 DIAGNOSIS — R102 Pelvic and perineal pain: Secondary | ICD-10-CM | POA: Diagnosis not present

## 2016-05-22 DIAGNOSIS — Z9049 Acquired absence of other specified parts of digestive tract: Secondary | ICD-10-CM | POA: Insufficient documentation

## 2016-05-22 DIAGNOSIS — K589 Irritable bowel syndrome without diarrhea: Secondary | ICD-10-CM | POA: Insufficient documentation

## 2016-05-22 DIAGNOSIS — K219 Gastro-esophageal reflux disease without esophagitis: Secondary | ICD-10-CM | POA: Insufficient documentation

## 2016-05-22 DIAGNOSIS — E669 Obesity, unspecified: Secondary | ICD-10-CM | POA: Insufficient documentation

## 2016-05-22 DIAGNOSIS — E785 Hyperlipidemia, unspecified: Secondary | ICD-10-CM | POA: Diagnosis not present

## 2016-05-22 DIAGNOSIS — Z800 Family history of malignant neoplasm of digestive organs: Secondary | ICD-10-CM | POA: Insufficient documentation

## 2016-05-22 DIAGNOSIS — M1990 Unspecified osteoarthritis, unspecified site: Secondary | ICD-10-CM | POA: Diagnosis not present

## 2016-05-22 DIAGNOSIS — E0500 Thyrotoxicosis with diffuse goiter without thyrotoxic crisis or storm: Secondary | ICD-10-CM | POA: Insufficient documentation

## 2016-05-22 DIAGNOSIS — Z90722 Acquired absence of ovaries, bilateral: Secondary | ICD-10-CM | POA: Insufficient documentation

## 2016-05-22 DIAGNOSIS — I9789 Other postprocedural complications and disorders of the circulatory system, not elsewhere classified: Secondary | ICD-10-CM

## 2016-05-22 DIAGNOSIS — Z833 Family history of diabetes mellitus: Secondary | ICD-10-CM | POA: Insufficient documentation

## 2016-05-22 DIAGNOSIS — E039 Hypothyroidism, unspecified: Secondary | ICD-10-CM | POA: Diagnosis not present

## 2016-05-22 DIAGNOSIS — Z8249 Family history of ischemic heart disease and other diseases of the circulatory system: Secondary | ICD-10-CM | POA: Insufficient documentation

## 2016-05-22 DIAGNOSIS — Z6841 Body Mass Index (BMI) 40.0 and over, adult: Secondary | ICD-10-CM | POA: Diagnosis not present

## 2016-05-22 DIAGNOSIS — J449 Chronic obstructive pulmonary disease, unspecified: Secondary | ICD-10-CM | POA: Diagnosis not present

## 2016-05-22 DIAGNOSIS — G8929 Other chronic pain: Secondary | ICD-10-CM | POA: Diagnosis not present

## 2016-05-22 DIAGNOSIS — Z8542 Personal history of malignant neoplasm of other parts of uterus: Secondary | ICD-10-CM | POA: Diagnosis not present

## 2016-05-22 DIAGNOSIS — C541 Malignant neoplasm of endometrium: Secondary | ICD-10-CM | POA: Diagnosis not present
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
## 2016-05-22 NOTE — Patient Instructions (Signed)
Plan to follow up in 12 months or sooner if needed.  Please call our office in June or July to schedule your appointment.

## 2016-06-02 DIAGNOSIS — Z23 Encounter for immunization: Secondary | ICD-10-CM | POA: Diagnosis not present

## 2016-06-06 ENCOUNTER — Ambulatory Visit (HOSPITAL_BASED_OUTPATIENT_CLINIC_OR_DEPARTMENT_OTHER): Payer: Medicare Other | Admitting: Oncology

## 2016-06-06 VITALS — BP 127/57 | HR 66 | Temp 97.7°F | Resp 19 | Ht 65.75 in | Wt 268.5 lb

## 2016-06-06 DIAGNOSIS — Z8542 Personal history of malignant neoplasm of other parts of uterus: Secondary | ICD-10-CM | POA: Diagnosis not present

## 2016-06-06 DIAGNOSIS — N6092 Unspecified benign mammary dysplasia of left breast: Secondary | ICD-10-CM

## 2016-06-06 DIAGNOSIS — C541 Malignant neoplasm of endometrium: Secondary | ICD-10-CM

## 2016-06-06 DIAGNOSIS — Z1239 Encounter for other screening for malignant neoplasm of breast: Secondary | ICD-10-CM
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
## 2016-06-22 ENCOUNTER — Other Ambulatory Visit: Payer: Self-pay | Admitting: *Deleted

## 2016-06-22 DIAGNOSIS — Z1239 Encounter for other screening for malignant neoplasm of breast: Secondary | ICD-10-CM

## 2016-06-23 ENCOUNTER — Other Ambulatory Visit: Payer: Self-pay | Admitting: *Deleted

## 2016-07-19 ENCOUNTER — Encounter: Payer: Self-pay | Admitting: Pulmonary Disease

## 2016-07-19 ENCOUNTER — Ambulatory Visit (INDEPENDENT_AMBULATORY_CARE_PROVIDER_SITE_OTHER): Payer: Medicare Other | Admitting: Pulmonary Disease

## 2016-07-19 DIAGNOSIS — E789 Disorder of lipoprotein metabolism, unspecified: Secondary | ICD-10-CM | POA: Diagnosis not present

## 2016-07-19 DIAGNOSIS — J42 Unspecified chronic bronchitis: Secondary | ICD-10-CM

## 2016-07-19 DIAGNOSIS — I10 Essential (primary) hypertension: Secondary | ICD-10-CM | POA: Diagnosis not present

## 2016-07-19 DIAGNOSIS — E0789 Other specified disorders of thyroid: Secondary | ICD-10-CM | POA: Diagnosis not present

## 2016-07-19 LAB — HEPATIC FUNCTION PANEL: Alkaline Phosphatase: 203 — AB (ref 25–125)

## 2016-07-19 MED ORDER — ALBUTEROL SULFATE HFA 108 (90 BASE) MCG/ACT IN AERS: 2.0000 | INHALATION_SPRAY | Freq: Four times a day (QID) | RESPIRATORY_TRACT | 6 refills | Status: DC | PRN

## 2016-07-19 NOTE — Patient Instructions (Signed)
Try using Advair twice a day on an every other day basis or 1 puff daily Use albuterol as needed for chest tightness shortness of breath or wheezing We will see you back in 6 months or sooner if needed
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
## 2016-07-19 NOTE — Assessment & Plan Note (Signed)
This has been a stable interval for [facility]. She has not had an exacerbation since last visit. She remains intolerant of standard doses of controller medications due to her severe glaucoma. I have advised that she try using half dose of Advair (either twice a day every other day or 1 puff daily).  Plan: Refill albuterol Flu shot is up-to-date Follow-up 6 months

## 2016-07-27 DIAGNOSIS — C541 Malignant neoplasm of endometrium: Secondary | ICD-10-CM | POA: Diagnosis not present

## 2016-07-27 DIAGNOSIS — E032 Hypothyroidism due to medicaments and other exogenous substances: Secondary | ICD-10-CM | POA: Diagnosis not present

## 2016-07-27 DIAGNOSIS — I10 Essential (primary) hypertension: Secondary | ICD-10-CM | POA: Diagnosis not present

## 2016-07-27 DIAGNOSIS — Z23 Encounter for immunization: Secondary | ICD-10-CM | POA: Diagnosis not present

## 2016-08-03 ENCOUNTER — Ambulatory Visit
Admission: RE | Admit: 2016-08-03 | Discharge: 2016-08-03 | Disposition: A | Discharge status: Home / Self Care | Payer: Medicare Other | Source: Ambulatory Visit | Attending: Oncology | Admitting: Oncology

## 2016-08-03 DIAGNOSIS — N6489 Other specified disorders of breast: Secondary | ICD-10-CM | POA: Diagnosis not present

## 2016-08-03 DIAGNOSIS — Z1239 Encounter for other screening for malignant neoplasm of breast: Secondary | ICD-10-CM

## 2016-08-03 MED ORDER — GADOBENATE DIMEGLUMINE 529 MG/ML IV SOLN
20.0000 mL | Freq: Once | INTRAVENOUS | Status: AC | PRN
  Administered 2016-08-03: 20 mL via INTRAVENOUS

## 2016-09-05 DIAGNOSIS — I10 Essential (primary) hypertension: Secondary | ICD-10-CM | POA: Diagnosis not present

## 2016-09-05 DIAGNOSIS — E032 Hypothyroidism due to medicaments and other exogenous substances: Secondary | ICD-10-CM | POA: Diagnosis not present

## 2016-09-05 DIAGNOSIS — E559 Vitamin D deficiency, unspecified: Secondary | ICD-10-CM | POA: Diagnosis not present

## 2016-09-05 DIAGNOSIS — E789 Disorder of lipoprotein metabolism, unspecified: Secondary | ICD-10-CM | POA: Diagnosis not present

## 2016-09-05 LAB — CBC AND DIFFERENTIAL
HCT: 40 (ref 36–46)
HEMOGLOBIN: 13.1 (ref 12.0–16.0)
Platelets: 267 (ref 150–399)
WBC: 6.3

## 2016-09-05 LAB — BASIC METABOLIC PANEL
BUN: 16 (ref 4–21)
CREATININE: 0.7 (ref 0.5–1.1)
Glucose: 91
Potassium: 4.5 (ref 3.4–5.3)
SODIUM: 141 (ref 137–147)

## 2016-09-05 LAB — HEPATIC FUNCTION PANEL
ALT: 52 — AB (ref 7–35)
AST: 38 — AB (ref 13–35)
Alkaline Phosphatase: 182 — AB (ref 25–125)
BILIRUBIN, TOTAL: 0.4

## 2016-09-05 LAB — LIPID PANEL
CHOLESTEROL: 149 (ref 0–200)
HDL: 53 (ref 35–70)
Triglycerides: 174 — AB (ref 40–160)

## 2016-09-12 DIAGNOSIS — I10 Essential (primary) hypertension: Secondary | ICD-10-CM | POA: Diagnosis not present

## 2016-09-12 DIAGNOSIS — R0602 Shortness of breath: Secondary | ICD-10-CM | POA: Diagnosis not present

## 2016-09-12 DIAGNOSIS — E032 Hypothyroidism due to medicaments and other exogenous substances: Secondary | ICD-10-CM | POA: Diagnosis not present

## 2016-09-12 DIAGNOSIS — E789 Disorder of lipoprotein metabolism, unspecified: Secondary | ICD-10-CM | POA: Diagnosis not present

## 2016-09-14 ENCOUNTER — Ambulatory Visit: Payer: Medicare Other | Admitting: Obstetrics and Gynecology

## 2016-10-12 DIAGNOSIS — F451 Undifferentiated somatoform disorder: Secondary | ICD-10-CM | POA: Diagnosis not present

## 2016-10-12 DIAGNOSIS — F341 Dysthymic disorder: Secondary | ICD-10-CM | POA: Diagnosis not present

## 2016-10-12 DIAGNOSIS — F330 Major depressive disorder, recurrent, mild: Secondary | ICD-10-CM | POA: Diagnosis not present

## 2016-11-07 DIAGNOSIS — E789 Disorder of lipoprotein metabolism, unspecified: Secondary | ICD-10-CM | POA: Diagnosis not present

## 2016-11-07 DIAGNOSIS — E559 Vitamin D deficiency, unspecified: Secondary | ICD-10-CM | POA: Diagnosis not present

## 2016-11-07 DIAGNOSIS — M810 Age-related osteoporosis without current pathological fracture: Secondary | ICD-10-CM | POA: Diagnosis not present

## 2016-11-07 DIAGNOSIS — I10 Essential (primary) hypertension: Secondary | ICD-10-CM | POA: Diagnosis not present

## 2016-11-07 DIAGNOSIS — E0789 Other specified disorders of thyroid: Secondary | ICD-10-CM | POA: Diagnosis not present

## 2016-11-14 DIAGNOSIS — E559 Vitamin D deficiency, unspecified: Secondary | ICD-10-CM | POA: Diagnosis not present

## 2016-11-14 DIAGNOSIS — G629 Polyneuropathy, unspecified: Secondary | ICD-10-CM | POA: Diagnosis not present

## 2016-11-14 DIAGNOSIS — I10 Essential (primary) hypertension: Secondary | ICD-10-CM | POA: Diagnosis not present

## 2016-11-14 DIAGNOSIS — E032 Hypothyroidism due to medicaments and other exogenous substances: Secondary | ICD-10-CM | POA: Diagnosis not present
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
## 2016-11-30 ENCOUNTER — Encounter: Payer: Self-pay | Admitting: Obstetrics and Gynecology

## 2016-11-30 ENCOUNTER — Ambulatory Visit (INDEPENDENT_AMBULATORY_CARE_PROVIDER_SITE_OTHER): Payer: Medicare Other | Admitting: Obstetrics and Gynecology

## 2016-11-30 VITALS — BP 118/64 | HR 84 | Resp 16 | Ht 66.25 in | Wt 276.6 lb

## 2016-11-30 DIAGNOSIS — Z01419 Encounter for gynecological examination (general) (routine) without abnormal findings: Secondary | ICD-10-CM

## 2016-11-30 DIAGNOSIS — Z124 Encounter for screening for malignant neoplasm of cervix: Secondary | ICD-10-CM | POA: Diagnosis not present

## 2016-11-30 DIAGNOSIS — Z8542 Personal history of malignant neoplasm of other parts of uterus: Secondary | ICD-10-CM

## 2016-11-30 NOTE — Patient Instructions (Signed)

## 2016-12-19 DIAGNOSIS — E789 Disorder of lipoprotein metabolism, unspecified: Secondary | ICD-10-CM | POA: Diagnosis not present

## 2016-12-19 DIAGNOSIS — E0789 Other specified disorders of thyroid: Secondary | ICD-10-CM | POA: Diagnosis not present

## 2016-12-19 DIAGNOSIS — E559 Vitamin D deficiency, unspecified: Secondary | ICD-10-CM | POA: Diagnosis not present

## 2016-12-19 DIAGNOSIS — I10 Essential (primary) hypertension: Secondary | ICD-10-CM | POA: Diagnosis not present

## 2016-12-19 LAB — VITAMIN D 25 HYDROXY (VIT D DEFICIENCY, FRACTURES)
Vit D, 25-Hydroxy: 28
Vit D, 25-Hydroxy: 28

## 2016-12-19 LAB — TSH
TSH: 0.13 — AB (ref 0.41–5.90)
TSH: 0.13 — AB (ref 0.41–5.90)

## 2016-12-20 DIAGNOSIS — H0289 Other specified disorders of eyelid: Secondary | ICD-10-CM | POA: Diagnosis not present

## 2016-12-20 DIAGNOSIS — H2513 Age-related nuclear cataract, bilateral: Secondary | ICD-10-CM | POA: Diagnosis not present

## 2016-12-20 DIAGNOSIS — H25013 Cortical age-related cataract, bilateral: Secondary | ICD-10-CM | POA: Diagnosis not present

## 2016-12-20 DIAGNOSIS — M3501 Sicca syndrome with keratoconjunctivitis: Secondary | ICD-10-CM | POA: Diagnosis not present

## 2016-12-26 DIAGNOSIS — E032 Hypothyroidism due to medicaments and other exogenous substances: Secondary | ICD-10-CM | POA: Diagnosis not present

## 2016-12-26 DIAGNOSIS — E559 Vitamin D deficiency, unspecified: Secondary | ICD-10-CM | POA: Diagnosis not present

## 2016-12-26 DIAGNOSIS — E789 Disorder of lipoprotein metabolism, unspecified: Secondary | ICD-10-CM | POA: Diagnosis not present

## 2017-01-18 ENCOUNTER — Ambulatory Visit: Payer: Medicare Other | Admitting: Pulmonary Disease

## 2017-01-30 DIAGNOSIS — H25813 Combined forms of age-related cataract, bilateral: Secondary | ICD-10-CM | POA: Diagnosis not present

## 2017-01-30 DIAGNOSIS — H52223 Regular astigmatism, bilateral: Secondary | ICD-10-CM | POA: Diagnosis not present

## 2017-01-30 DIAGNOSIS — H16223 Keratoconjunctivitis sicca, not specified as Sjogren's, bilateral: Secondary | ICD-10-CM | POA: Diagnosis not present

## 2017-01-30 DIAGNOSIS — H43393 Other vitreous opacities, bilateral: Secondary | ICD-10-CM | POA: Diagnosis not present

## 2017-02-26 DIAGNOSIS — F330 Major depressive disorder, recurrent, mild: Secondary | ICD-10-CM | POA: Diagnosis not present

## 2017-02-26 DIAGNOSIS — F341 Dysthymic disorder: Secondary | ICD-10-CM | POA: Diagnosis not present

## 2017-02-26 DIAGNOSIS — F451 Undifferentiated somatoform disorder: Secondary | ICD-10-CM | POA: Diagnosis not present

## 2017-02-27 ENCOUNTER — Telehealth: Payer: Self-pay | Admitting: *Deleted

## 2017-02-27 NOTE — Telephone Encounter (Signed)
Patient called and scheduled a follow up appt for the end of August. Appt scheduled for August 27th

## 2017-03-21 ENCOUNTER — Ambulatory Visit (INDEPENDENT_AMBULATORY_CARE_PROVIDER_SITE_OTHER): Payer: Medicare Other | Admitting: Pulmonary Disease

## 2017-03-21 ENCOUNTER — Encounter: Payer: Self-pay | Admitting: Pulmonary Disease

## 2017-03-21 VITALS — BP 132/76 | HR 84 | Ht 65.75 in | Wt 280.0 lb

## 2017-03-21 DIAGNOSIS — J42 Unspecified chronic bronchitis: Secondary | ICD-10-CM | POA: Diagnosis not present

## 2017-03-21 DIAGNOSIS — J438 Other emphysema: Secondary | ICD-10-CM | POA: Diagnosis not present

## 2017-03-21 MED ORDER — FLUTICASONE-SALMETEROL 250-50 MCG/DOSE IN AEPB: 1.0000 | INHALATION_SPRAY | Freq: Two times a day (BID) | RESPIRATORY_TRACT | 11 refills | Status: DC

## 2017-03-21 NOTE — Addendum Note (Signed)
Addended by: Len Blalock on: 03/21/2017 09:21 AM   Modules accepted: Orders

## 2017-03-21 NOTE — Patient Instructions (Signed)
For your COPD: Keep taking Advair as you are doing about every third day Keep using albuterol as needed for chest tightness wheezing or shortness of breath Get a flu shot when it comes out in a few weeks Let us know if you have any difficulty breathing or with an episode of bronchitis We will see you back in one year or sooner if needed
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
## 2017-03-27 DIAGNOSIS — R922 Inconclusive mammogram: Secondary | ICD-10-CM | POA: Diagnosis not present

## 2017-04-03 ENCOUNTER — Telehealth: Payer: Self-pay

## 2017-04-03 NOTE — Telephone Encounter (Signed)
No Notes
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
## 2017-04-04 ENCOUNTER — Ambulatory Visit (INDEPENDENT_AMBULATORY_CARE_PROVIDER_SITE_OTHER): Payer: Medicare Other | Admitting: Family Medicine

## 2017-04-04 VITALS — BP 117/75 | HR 66 | Temp 98.1°F | Ht 65.75 in | Wt 283.2 lb

## 2017-04-04 DIAGNOSIS — E782 Mixed hyperlipidemia: Secondary | ICD-10-CM | POA: Diagnosis not present

## 2017-04-04 DIAGNOSIS — I890 Lymphedema, not elsewhere classified: Secondary | ICD-10-CM | POA: Diagnosis not present

## 2017-04-04 DIAGNOSIS — I10 Essential (primary) hypertension: Secondary | ICD-10-CM

## 2017-04-04 DIAGNOSIS — Z803 Family history of malignant neoplasm of breast: Secondary | ICD-10-CM

## 2017-04-04 DIAGNOSIS — G6289 Other specified polyneuropathies: Secondary | ICD-10-CM | POA: Diagnosis not present

## 2017-04-04 NOTE — Patient Instructions (Signed)
We will request your records from Dr. Wilson Singer- we can plan to see you the first of the year for your physical and labs  It was very nice to meet you today!

## 2017-04-16 ENCOUNTER — Ambulatory Visit: Payer: Medicare Other | Attending: Gynecologic Oncology | Admitting: Gynecologic Oncology

## 2017-04-16 ENCOUNTER — Encounter: Payer: Self-pay | Admitting: Gynecologic Oncology

## 2017-04-16 VITALS — BP 98/70 | HR 88 | Temp 98.1°F | Resp 20 | Wt 280.8 lb

## 2017-04-16 DIAGNOSIS — E039 Hypothyroidism, unspecified: Secondary | ICD-10-CM | POA: Insufficient documentation

## 2017-04-16 DIAGNOSIS — Z79890 Hormone replacement therapy (postmenopausal): Secondary | ICD-10-CM | POA: Insufficient documentation

## 2017-04-16 DIAGNOSIS — M797 Fibromyalgia: Secondary | ICD-10-CM | POA: Diagnosis not present

## 2017-04-16 DIAGNOSIS — F419 Anxiety disorder, unspecified: Secondary | ICD-10-CM | POA: Diagnosis not present

## 2017-04-16 DIAGNOSIS — E785 Hyperlipidemia, unspecified: Secondary | ICD-10-CM | POA: Diagnosis not present

## 2017-04-16 DIAGNOSIS — J449 Chronic obstructive pulmonary disease, unspecified: Secondary | ICD-10-CM | POA: Diagnosis not present

## 2017-04-16 DIAGNOSIS — Z08 Encounter for follow-up examination after completed treatment for malignant neoplasm: Secondary | ICD-10-CM | POA: Insufficient documentation

## 2017-04-16 DIAGNOSIS — E669 Obesity, unspecified: Secondary | ICD-10-CM | POA: Diagnosis not present

## 2017-04-16 DIAGNOSIS — I890 Lymphedema, not elsewhere classified: Secondary | ICD-10-CM | POA: Insufficient documentation

## 2017-04-16 DIAGNOSIS — C541 Malignant neoplasm of endometrium: Secondary | ICD-10-CM

## 2017-04-16 DIAGNOSIS — I10 Essential (primary) hypertension: Secondary | ICD-10-CM | POA: Insufficient documentation

## 2017-04-16 DIAGNOSIS — F329 Major depressive disorder, single episode, unspecified: Secondary | ICD-10-CM | POA: Diagnosis not present

## 2017-04-16 DIAGNOSIS — Z8542 Personal history of malignant neoplasm of other parts of uterus: Secondary | ICD-10-CM | POA: Diagnosis not present

## 2017-04-16 DIAGNOSIS — Z9049 Acquired absence of other specified parts of digestive tract: Secondary | ICD-10-CM | POA: Insufficient documentation

## 2017-04-16 DIAGNOSIS — I9789 Other postprocedural complications and disorders of the circulatory system, not elsewhere classified: Secondary | ICD-10-CM

## 2017-04-16 DIAGNOSIS — G8929 Other chronic pain: Secondary | ICD-10-CM | POA: Insufficient documentation

## 2017-04-16 DIAGNOSIS — G629 Polyneuropathy, unspecified: Secondary | ICD-10-CM | POA: Insufficient documentation

## 2017-04-16 DIAGNOSIS — M549 Dorsalgia, unspecified: Secondary | ICD-10-CM | POA: Insufficient documentation

## 2017-04-16 DIAGNOSIS — Z90710 Acquired absence of both cervix and uterus: Secondary | ICD-10-CM | POA: Diagnosis not present
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
## 2017-04-16 NOTE — Patient Instructions (Addendum)
Follow up in 6 months with Dr. Quincy Simmonds and 1 year with Dr. Denman George.  Call in March of 2019 to schedule your follow up appointment.

## 2017-04-26 ENCOUNTER — Telehealth: Payer: Self-pay | Admitting: *Deleted

## 2017-04-26 DIAGNOSIS — Z23 Encounter for immunization: Secondary | ICD-10-CM | POA: Diagnosis not present

## 2017-04-26 NOTE — Telephone Encounter (Signed)
Received Medical records from [facility]; forwarded to provider/SLS 09/06

## 2017-04-29 ENCOUNTER — Telehealth: Payer: Self-pay | Admitting: Family Medicine

## 2017-04-29 DIAGNOSIS — E559 Vitamin D deficiency, unspecified: Secondary | ICD-10-CM | POA: Insufficient documentation

## 2017-04-29 NOTE — Telephone Encounter (Signed)
Received her records from [facility]- will abstract and scan as appropriate

## 2017-05-04 ENCOUNTER — Encounter: Payer: Self-pay | Admitting: Family Medicine

## 2017-05-04 NOTE — Progress Notes (Unsigned)
T4: 7.9

## 2017-05-11 ENCOUNTER — Encounter: Payer: Self-pay | Admitting: Family Medicine

## 2017-05-11 NOTE — Progress Notes (Signed)
T4: 7.9

## 2017-05-30 ENCOUNTER — Encounter (HOSPITAL_COMMUNITY): Payer: Self-pay

## 2017-05-30 ENCOUNTER — Other Ambulatory Visit (HOSPITAL_BASED_OUTPATIENT_CLINIC_OR_DEPARTMENT_OTHER): Payer: Medicare Other

## 2017-05-30 ENCOUNTER — Ambulatory Visit (HOSPITAL_COMMUNITY)
Admission: RE | Admit: 2017-05-30 | Discharge: 2017-05-30 | Disposition: A | Discharge status: Home / Self Care | Payer: Medicare Other | Source: Ambulatory Visit | Attending: Oncology | Admitting: Oncology

## 2017-05-30 DIAGNOSIS — Z1239 Encounter for other screening for malignant neoplasm of breast: Secondary | ICD-10-CM

## 2017-05-30 DIAGNOSIS — K760 Fatty (change of) liver, not elsewhere classified: Secondary | ICD-10-CM | POA: Insufficient documentation

## 2017-05-30 DIAGNOSIS — Z90710 Acquired absence of both cervix and uterus: Secondary | ICD-10-CM | POA: Diagnosis not present

## 2017-05-30 DIAGNOSIS — N6092 Unspecified benign mammary dysplasia of left breast: Secondary | ICD-10-CM | POA: Diagnosis present

## 2017-05-30 DIAGNOSIS — Z8542 Personal history of malignant neoplasm of other parts of uterus: Secondary | ICD-10-CM | POA: Diagnosis not present

## 2017-05-30 DIAGNOSIS — C541 Malignant neoplasm of endometrium: Secondary | ICD-10-CM | POA: Diagnosis not present

## 2017-05-30 DIAGNOSIS — R1900 Intra-abdominal and pelvic swelling, mass and lump, unspecified site: Secondary | ICD-10-CM | POA: Diagnosis not present

## 2017-05-30 LAB — COMPREHENSIVE METABOLIC PANEL
ALT: 44 U/L (ref 0–55)
AST: 38 U/L — AB (ref 5–34)
Albumin: 3.6 g/dL (ref 3.5–5.0)
Alkaline Phosphatase: 169 U/L — ABNORMAL HIGH (ref 40–150)
Anion Gap: 9 mEq/L (ref 3–11)
BUN: 14.2 mg/dL (ref 7.0–26.0)
CHLORIDE: 99 meq/L (ref 98–109)
CO2: 31 mEq/L — ABNORMAL HIGH (ref 22–29)
CREATININE: 0.8 mg/dL (ref 0.6–1.1)
Calcium: 9.9 mg/dL (ref 8.4–10.4)
EGFR: >60 mL/min/{1.73_m2} (ref >60)
Glucose: 77 mg/dl (ref 70–140)
POTASSIUM: 4 meq/L (ref 3.5–5.1)
Sodium: 140 mEq/L (ref 136–145)
TOTAL PROTEIN: 7.6 g/dL (ref 6.4–8.3)
Total Bilirubin: 0.3 mg/dL (ref 0.20–1.20)

## 2017-05-30 LAB — CBC WITH DIFFERENTIAL/PLATELET
BASO%: 0.3 % (ref 0.0–2.0)
Basophils Absolute: 0 10*3/uL (ref 0.0–0.1)
EOS%: 1.6 % (ref 0.0–7.0)
Eosinophils Absolute: 0.1 10*3/uL (ref 0.0–0.5)
HEMATOCRIT: 40.4 % (ref 34.8–46.6)
HGB: 12.7 g/dL (ref 11.6–15.9)
LYMPH#: 1.7 10*3/uL (ref 0.9–3.3)
LYMPH%: 21.6 % (ref 14.0–49.7)
MCH: 30.1 pg (ref 25.1–34.0)
MCHC: 31.4 g/dL — ABNORMAL LOW (ref 31.5–36.0)
MCV: 95.7 fL (ref 79.5–101.0)
MONO#: 0.5 10*3/uL (ref 0.1–0.9)
MONO%: 5.9 % (ref 0.0–14.0)
NEUT%: 70.6 % (ref 38.4–76.8)
NEUTROS ABS: 5.7 10*3/uL (ref 1.5–6.5)
PLATELETS: 259 10*3/uL (ref 145–400)
RBC: 4.22 10*6/uL (ref 3.70–5.45)
RDW: 13.5 % (ref 11.2–14.5)
WBC: 8 10*3/uL (ref 3.9–10.3)

## 2017-05-30 MED ORDER — IOPAMIDOL (ISOVUE-300) INJECTION 61%
INTRAVENOUS | Status: AC
  Filled 2017-05-30: qty 100

## 2017-05-30 MED ORDER — IOPAMIDOL (ISOVUE-300) INJECTION 61%
100.0000 mL | Freq: Once | INTRAVENOUS | Status: AC | PRN
  Administered 2017-05-30: 100 mL via INTRAVENOUS
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
## 2017-06-06 ENCOUNTER — Ambulatory Visit (HOSPITAL_BASED_OUTPATIENT_CLINIC_OR_DEPARTMENT_OTHER): Payer: Medicare Other | Admitting: Oncology

## 2017-06-06 ENCOUNTER — Telehealth: Payer: Self-pay | Admitting: Oncology

## 2017-06-06 VITALS — BP 119/59 | HR 80 | Temp 97.6°F | Resp 20 | Ht 65.75 in | Wt 285.7 lb

## 2017-06-06 DIAGNOSIS — Z803 Family history of malignant neoplasm of breast: Secondary | ICD-10-CM

## 2017-06-06 DIAGNOSIS — N951 Menopausal and female climacteric states: Secondary | ICD-10-CM | POA: Diagnosis not present

## 2017-06-06 DIAGNOSIS — C541 Malignant neoplasm of endometrium: Secondary | ICD-10-CM

## 2017-06-06 DIAGNOSIS — Z8542 Personal history of malignant neoplasm of other parts of uterus: Secondary | ICD-10-CM | POA: Diagnosis not present

## 2017-06-06 DIAGNOSIS — K760 Fatty (change of) liver, not elsewhere classified: Secondary | ICD-10-CM

## 2017-06-06 DIAGNOSIS — Z9223 Personal history of estrogen therapy: Secondary | ICD-10-CM

## 2017-06-06 DIAGNOSIS — Z1239 Encounter for other screening for malignant neoplasm of breast: Secondary | ICD-10-CM

## 2017-06-06 MED ORDER — VENLAFAXINE HCL 75 MG PO TABS: 37.5000 mg | ORAL_TABLET | Freq: Two times a day (BID) | ORAL | 4 refills | Status: DC

## 2017-06-06 NOTE — Telephone Encounter (Signed)
Gave patient AVS and calendar of upcoming October 2019.

## 2017-06-22 DIAGNOSIS — M3501 Sicca syndrome with keratoconjunctivitis: Secondary | ICD-10-CM | POA: Diagnosis not present

## 2017-06-22 DIAGNOSIS — H43811 Vitreous degeneration, right eye: Secondary | ICD-10-CM | POA: Diagnosis not present

## 2017-06-22 DIAGNOSIS — H25013 Cortical age-related cataract, bilateral: Secondary | ICD-10-CM | POA: Diagnosis not present

## 2017-06-22 DIAGNOSIS — H2513 Age-related nuclear cataract, bilateral: Secondary | ICD-10-CM | POA: Diagnosis not present

## 2017-06-22 DIAGNOSIS — H02889 Meibomian gland dysfunction of unspecified eye, unspecified eyelid: Secondary | ICD-10-CM | POA: Diagnosis not present

## 2017-07-06 ENCOUNTER — Encounter: Payer: Self-pay | Admitting: Family Medicine

## 2017-07-06 ENCOUNTER — Ambulatory Visit (INDEPENDENT_AMBULATORY_CARE_PROVIDER_SITE_OTHER): Payer: Medicare Other | Admitting: Family Medicine

## 2017-07-06 VITALS — BP 132/80 | HR 84 | Temp 98.0°F | Ht 67.0 in | Wt 290.0 lb

## 2017-07-06 DIAGNOSIS — R300 Dysuria: Secondary | ICD-10-CM | POA: Diagnosis not present

## 2017-07-06 DIAGNOSIS — N3090 Cystitis, unspecified without hematuria: Secondary | ICD-10-CM

## 2017-07-06 MED ORDER — CEPHALEXIN 500 MG PO CAPS: 500.0000 mg | ORAL_CAPSULE | Freq: Two times a day (BID) | ORAL | 0 refills | Status: DC

## 2017-07-06 MED FILL — CEPHALEXIN 500 MG CAPSULE: 500 | 7 days supply | Qty: 14 | Fill #0

## 2017-07-06 NOTE — Progress Notes (Signed)
Pre visit review using our clinic review tool, if applicable. No additional management support is needed unless otherwise documented below in the visit note. 

## 2017-07-06 NOTE — Patient Instructions (Signed)
Fevers, nausea/vomiting, or worsening symptoms should prompt seeking care.  Let us know if you need anything.
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
## 2017-07-08 LAB — URINE CULTURE
MICRO NUMBER: 81295454
SPECIMEN QUALITY:: ADEQUATE

## 2017-07-09 ENCOUNTER — Other Ambulatory Visit: Payer: Self-pay | Admitting: Family Medicine

## 2017-07-09 MED ORDER — TRAMADOL HCL 50 MG PO TABS: 50.0000 mg | ORAL_TABLET | Freq: Three times a day (TID) | ORAL | 1 refills | Status: DC | PRN

## 2017-07-09 NOTE — Telephone Encounter (Signed)
Self   Refill for Tramadol    Pharmacy: [facility] on [address] says that she was told by provider that she would refill w/out an apt.

## 2017-07-09 NOTE — Telephone Encounter (Signed)
Called pt to clarify her tramadol dose- she uses 50 mg TID, has done so for a long time with no problems Refilled for her today  Meds ordered this encounter  Medications  . traMADol (ULTRAM) 50 MG tablet    Sig: Take 1 tablet (50 mg total) 3 (three) times daily as needed by mouth (moderate to severe pain).    Dispense:  90 tablet    Refill:  1

## 2017-07-09 NOTE — Telephone Encounter (Signed)
Sent to provider for approval

## 2017-07-16 DIAGNOSIS — F330 Major depressive disorder, recurrent, mild: Secondary | ICD-10-CM | POA: Diagnosis not present

## 2017-07-16 DIAGNOSIS — F451 Undifferentiated somatoform disorder: Secondary | ICD-10-CM | POA: Diagnosis not present

## 2017-07-16 DIAGNOSIS — F341 Dysthymic disorder: Secondary | ICD-10-CM | POA: Diagnosis not present

## 2017-08-12 ENCOUNTER — Other Ambulatory Visit: Payer: Self-pay

## 2017-08-12 ENCOUNTER — Encounter: Payer: Self-pay | Admitting: Emergency Medicine

## 2017-08-12 ENCOUNTER — Emergency Department (INDEPENDENT_AMBULATORY_CARE_PROVIDER_SITE_OTHER)
Admission: EM | Admit: 2017-08-12 | Discharge: 2017-08-12 | Disposition: A | Discharge status: Home / Self Care | Payer: Medicare Other | Source: Home / Self Care | Attending: Emergency Medicine | Admitting: Emergency Medicine

## 2017-08-12 DIAGNOSIS — N3001 Acute cystitis with hematuria: Secondary | ICD-10-CM | POA: Diagnosis not present

## 2017-08-12 LAB — POCT URINALYSIS DIP (MANUAL ENTRY)
Bilirubin, UA: NEGATIVE
Glucose, UA: NEGATIVE mg/dL
Ketones, POC UA: NEGATIVE mg/dL
Nitrite, UA: POSITIVE — AB
Protein Ur, POC: NEGATIVE mg/dL
Spec Grav, UA: 1.015 (ref 1.010–1.025)
Urobilinogen, UA: 0.2 E.U./dL
pH, UA: 6 (ref 5.0–8.0)

## 2017-08-12 MED ORDER — CEPHALEXIN 250 MG PO CAPS: 250.0000 mg | ORAL_CAPSULE | Freq: Three times a day (TID) | ORAL | 0 refills | Status: DC

## 2017-08-12 NOTE — ED Triage Notes (Signed)
Patient presents to [facility] with a complaint of Urinary Frequency & Urgency, along with cramping since last PM.
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
## 2017-08-14 LAB — URINE CULTURE
MICRO NUMBER:: 81445017
SPECIMEN QUALITY:: ADEQUATE

## 2017-08-15 ENCOUNTER — Telehealth: Payer: Self-pay | Admitting: Emergency Medicine

## 2017-08-27 ENCOUNTER — Ambulatory Visit
Admission: RE | Admit: 2017-08-27 | Discharge: 2017-08-27 | Disposition: A | Discharge status: Home / Self Care | Payer: Medicare Other | Source: Ambulatory Visit | Attending: Oncology | Admitting: Oncology

## 2017-08-27 DIAGNOSIS — C541 Malignant neoplasm of endometrium: Secondary | ICD-10-CM

## 2017-08-27 DIAGNOSIS — Z803 Family history of malignant neoplasm of breast: Secondary | ICD-10-CM | POA: Diagnosis not present

## 2017-08-27 DIAGNOSIS — N6489 Other specified disorders of breast: Secondary | ICD-10-CM | POA: Diagnosis not present

## 2017-08-27 DIAGNOSIS — Z1239 Encounter for other screening for malignant neoplasm of breast: Secondary | ICD-10-CM

## 2017-08-27 MED ORDER — GADOBENATE DIMEGLUMINE 529 MG/ML IV SOLN
20.0000 mL | Freq: Once | INTRAVENOUS | Status: AC | PRN
  Administered 2017-08-27: 20 mL via INTRAVENOUS

## 2017-08-28 DIAGNOSIS — Z8639 Personal history of other endocrine, nutritional and metabolic disease: Secondary | ICD-10-CM | POA: Diagnosis not present

## 2017-08-28 DIAGNOSIS — R5382 Chronic fatigue, unspecified: Secondary | ICD-10-CM | POA: Diagnosis not present

## 2017-08-28 DIAGNOSIS — Z6841 Body Mass Index (BMI) 40.0 and over, adult: Secondary | ICD-10-CM | POA: Diagnosis not present

## 2017-08-28 DIAGNOSIS — E559 Vitamin D deficiency, unspecified: Secondary | ICD-10-CM | POA: Diagnosis not present

## 2017-08-28 DIAGNOSIS — E039 Hypothyroidism, unspecified: Secondary | ICD-10-CM | POA: Diagnosis not present
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
## 2017-09-03 ENCOUNTER — Telehealth: Payer: Self-pay | Admitting: Family Medicine

## 2017-09-03 ENCOUNTER — Other Ambulatory Visit: Payer: Self-pay | Admitting: Family Medicine

## 2017-09-03 ENCOUNTER — Other Ambulatory Visit: Payer: Self-pay | Admitting: Emergency Medicine

## 2017-09-03 MED ORDER — PREGABALIN 100 MG PO CAPS: 100.0000 mg | ORAL_CAPSULE | Freq: Three times a day (TID) | ORAL | 0 refills | Status: DC

## 2017-09-03 NOTE — Telephone Encounter (Signed)
Refill sent per pt request.  

## 2017-09-03 NOTE — Telephone Encounter (Signed)
Copied from [place] [id number]. Topic: Quick Communication - See Telephone Encounter >> Sep 03, 2017  9:41 AM Bea Graff, NT wrote: CRM for notification. See Telephone encounter for: Pt requesting a refill of pregabalin (LYRICA) 100 MG, she is almost completley out. Uses [facility] on [address] in [place]  09/03/17.

## 2017-09-03 NOTE — Telephone Encounter (Signed)
Medication refill. Has appointment for 09/06/17. Thanks.

## 2017-09-06 ENCOUNTER — Encounter: Payer: Self-pay | Admitting: Family Medicine

## 2017-09-06 ENCOUNTER — Ambulatory Visit (INDEPENDENT_AMBULATORY_CARE_PROVIDER_SITE_OTHER): Payer: Medicare Other | Admitting: Family Medicine

## 2017-09-06 VITALS — BP 110/82 | HR 74 | Temp 97.5°F | Ht 67.0 in | Wt 280.2 lb

## 2017-09-06 DIAGNOSIS — G6289 Other specified polyneuropathies: Secondary | ICD-10-CM

## 2017-09-06 DIAGNOSIS — E782 Mixed hyperlipidemia: Secondary | ICD-10-CM | POA: Diagnosis not present

## 2017-09-06 DIAGNOSIS — I10 Essential (primary) hypertension: Secondary | ICD-10-CM | POA: Diagnosis not present

## 2017-09-06 DIAGNOSIS — Z1159 Encounter for screening for other viral diseases: Secondary | ICD-10-CM

## 2017-09-06 DIAGNOSIS — I890 Lymphedema, not elsewhere classified: Secondary | ICD-10-CM | POA: Diagnosis not present

## 2017-09-06 DIAGNOSIS — R634 Abnormal weight loss: Secondary | ICD-10-CM | POA: Diagnosis not present

## 2017-09-06 DIAGNOSIS — G629 Polyneuropathy, unspecified: Secondary | ICD-10-CM | POA: Insufficient documentation

## 2017-09-06 LAB — LIPID PANEL
CHOL/HDL RATIO: 3
Cholesterol: 137 mg/dL (ref 0–200)
HDL: 47.9 mg/dL (ref >39.00)
LDL Cholesterol: 53 mg/dL (ref 0–99)
NonHDL: 88.6
TRIGLYCERIDES: 180 mg/dL — AB (ref 0.0–149.0)
VLDL: 36 mg/dL (ref 0.0–40.0)

## 2017-09-06 MED ORDER — PREGABALIN 100 MG PO CAPS: 100.0000 mg | ORAL_CAPSULE | Freq: Three times a day (TID) | ORAL | 11 refills | Status: DC

## 2017-09-06 NOTE — Addendum Note (Signed)
Addended by: Emi Holes on: 09/06/2017 09:59 AM   Modules accepted: Orders

## 2017-09-06 NOTE — Patient Instructions (Signed)
It was great to see you again today- take care, I will be in touch with your labs asap  Please call your gastroenterologist and schedule a visit for your colonoscopy soon  I would recommend that you get the Shingrix vaccine series at your convenience- this is available at most major drug stores  [place] job with weight loss so far!   Health Maintenance, Female Adopting a healthy lifestyle and getting preventive care can go a long way to promote health and wellness. Talk with your health care provider about what schedule of regular examinations is right for you. This is a good chance for you to check in with your provider about disease prevention and staying healthy. In between checkups, there are plenty of things you can do on your own. Experts have done a lot of research about which lifestyle changes and preventive measures are most likely to keep you healthy. Ask your health care provider for more information. Weight and diet Eat a healthy diet  Be sure to include plenty of vegetables, fruits, low-fat dairy products, and lean protein.  Do not eat a lot of foods high in solid fats, added sugars, or salt.  Get regular exercise. This is one of the most important things you can do for your health. ? Most adults should exercise for at least 150 minutes each week. The exercise should increase your heart rate and make you sweat (moderate-intensity exercise). ? Most adults should also do strengthening exercises at least twice a week. This is in addition to the moderate-intensity exercise.  Maintain a healthy weight  Body mass index (BMI) is a measurement that can be used to identify possible weight problems. It estimates body fat based on height and weight. Your health care provider can help determine your BMI and help you achieve or maintain a healthy weight.  For females 15 years of age and older: ? A BMI below 18.5 is considered underweight. ? A BMI of 18.5 to 24.9 is normal. ? A BMI of 25 to  29.9 is considered overweight. ? A BMI of 30 and above is considered obese.  Watch levels of cholesterol and blood lipids  You should start having your blood tested for lipids and cholesterol at 72 years of age, then have this test every 5 years.  You may need to have your cholesterol levels checked more often if: ? Your lipid or cholesterol levels are high. ? You are older than 72 years of age. ? You are at high risk for heart disease.  Cancer screening Lung Cancer  Lung cancer screening is recommended for adults 55-70 years old who are at high risk for lung cancer because of a history of smoking.  A yearly low-dose CT scan of the lungs is recommended for people who: ? Currently smoke. ? Have quit within the past 15 years. ? Have at least a 30-pack-year history of smoking. A pack year is smoking an average of one pack of cigarettes a day for 1 year.  Yearly screening should continue until it has been 15 years since you quit.  Yearly screening should stop if you develop a health problem that would prevent you from having lung cancer treatment.  Breast Cancer  Practice breast self-awareness. This means understanding how your breasts normally appear and feel.  It also means doing regular breast self-exams. Let your health care provider know about any changes, no matter how small.  If you are in your 20s or 30s, you should have a clinical breast  exam (CBE) by a health care provider every 1-3 years as part of a regular health exam.  If you are 45 or older, have a CBE every year. Also consider having a breast X-ray (mammogram) every year.  If you have a family history of breast cancer, talk to your health care provider about genetic screening.  If you are at high risk for breast cancer, talk to your health care provider about having an MRI and a mammogram every year.  Breast cancer gene (BRCA) assessment is recommended for women who have family members with BRCA-related cancers.  BRCA-related cancers include: ? Breast. ? Ovarian. ? Tubal. ? Peritoneal cancers.  Results of the assessment will determine the need for genetic counseling and BRCA1 and BRCA2 testing.  Cervical Cancer Your health care provider may recommend that you be screened regularly for cancer of the pelvic organs (ovaries, uterus, and vagina). This screening involves a pelvic examination, including checking for microscopic changes to the surface of your cervix (Pap test). You may be encouraged to have this screening done every 3 years, beginning at age 32.  For women ages 26-65, health care providers may recommend pelvic exams and Pap testing every 3 years, or they may recommend the Pap and pelvic exam, combined with testing for human papilloma virus (HPV), every 5 years. Some types of HPV increase your risk of cervical cancer. Testing for HPV may also be done on women of any age with unclear Pap test results.  Other health care providers may not recommend any screening for nonpregnant women who are considered low risk for pelvic cancer and who do not have symptoms. Ask your health care provider if a screening pelvic exam is right for you.  If you have had past treatment for cervical cancer or a condition that could lead to cancer, you need Pap tests and screening for cancer for at least 20 years after your treatment. If Pap tests have been discontinued, your risk factors (such as having a new sexual partner) need to be reassessed to determine if screening should resume. Some women have medical problems that increase the chance of getting cervical cancer. In these cases, your health care provider may recommend more frequent screening and Pap tests.  Colorectal Cancer  This type of cancer can be detected and often prevented.  Routine colorectal cancer screening usually begins at 72 years of age and continues through 72 years of age.  Your health care provider may recommend screening at an earlier age if  you have risk factors for colon cancer.  Your health care provider may also recommend using home test kits to check for hidden blood in the stool.  A small camera at the end of a tube can be used to examine your colon directly (sigmoidoscopy or colonoscopy). This is done to check for the earliest forms of colorectal cancer.  Routine screening usually begins at age 88.  Direct examination of the colon should be repeated every 5-10 years through 72 years of age. However, you may need to be screened more often if early forms of precancerous polyps or small growths are found.  Skin Cancer  Check your skin from head to toe regularly.  Tell your health care provider about any new moles or changes in moles, especially if there is a change in a mole's shape or color.  Also tell your health care provider if you have a mole that is larger than the size of a pencil eraser.  Always use sunscreen. Apply sunscreen  liberally and repeatedly throughout the day.  Protect yourself by wearing long sleeves, pants, a wide-brimmed hat, and sunglasses whenever you are outside.  Heart disease, diabetes, and high blood pressure  High blood pressure causes heart disease and increases the risk of stroke. High blood pressure is more likely to develop in: ? People who have blood pressure in the high end of the normal range (130-139/85-89 mm Hg). ? People who are overweight or obese. ? People who are African American.  If you are 48-58 years of age, have your blood pressure checked every 3-5 years. If you are 20 years of age or older, have your blood pressure checked every year. You should have your blood pressure measured twice-once when you are at a hospital or clinic, and once when you are not at a hospital or clinic. Record the average of the two measurements. To check your blood pressure when you are not at a hospital or clinic, you can use: ? An automated blood pressure machine at a pharmacy. ? A home blood  pressure monitor.  If you are between 18 years and 76 years old, ask your health care provider if you should take aspirin to prevent strokes.  Have regular diabetes screenings. This involves taking a blood sample to check your fasting blood sugar level. ? If you are at a normal weight and have a low risk for diabetes, have this test once every three years after 72 years of age. ? If you are overweight and have a high risk for diabetes, consider being tested at a younger age or more often. Preventing infection Hepatitis B  If you have a higher risk for hepatitis B, you should be screened for this virus. You are considered at high risk for hepatitis B if: ? You were born in a country where hepatitis B is common. Ask your health care provider which countries are considered high risk. ? Your parents were born in a high-risk country, and you have not been immunized against hepatitis B (hepatitis B vaccine). ? You have HIV or AIDS. ? You use needles to inject street drugs. ? You live with someone who has hepatitis B. ? You have had sex with someone who has hepatitis B. ? You get hemodialysis treatment. ? You take certain medicines for conditions, including cancer, organ transplantation, and autoimmune conditions.  Hepatitis C  Blood testing is recommended for: ? Everyone born from [birth year] through 1965. ? Anyone with known risk factors for hepatitis C.  Sexually transmitted infections (STIs)  You should be screened for sexually transmitted infections (STIs) including gonorrhea and chlamydia if: ? You are sexually active and are younger than 71 years of age. ? You are older than 72 years of age and your health care provider tells you that you are at risk for this type of infection. ? Your sexual activity has changed since you were last screened and you are at an increased risk for chlamydia or gonorrhea. Ask your health care provider if you are at risk.  If you do not have HIV, but are at risk,  it may be recommended that you take a prescription medicine daily to prevent HIV infection. This is called pre-exposure prophylaxis (PrEP). You are considered at risk if: ? You are sexually active and do not regularly use condoms or know the HIV status of your partner(s). ? You take drugs by injection. ? You are sexually active with a partner who has HIV.  Talk with your health care provider about  whether you are at high risk of being infected with HIV. If you choose to begin PrEP, you should first be tested for HIV. You should then be tested every 3 months for as long as you are taking PrEP. Pregnancy  If you are premenopausal and you may become pregnant, ask your health care provider about preconception counseling.  If you may become pregnant, take 400 to 800 micrograms (mcg) of folic acid every day.  If you want to prevent pregnancy, talk to your health care provider about birth control (contraception). Osteoporosis and menopause  Osteoporosis is a disease in which the bones lose minerals and strength with aging. This can result in serious bone fractures. Your risk for osteoporosis can be identified using a bone density scan.  If you are 75 years of age or older, or if you are at risk for osteoporosis and fractures, ask your health care provider if you should be screened.  Ask your health care provider whether you should take a calcium or vitamin D supplement to lower your risk for osteoporosis.  Menopause may have certain physical symptoms and risks.  Hormone replacement therapy may reduce some of these symptoms and risks. Talk to your health care provider about whether hormone replacement therapy is right for you. Follow these instructions at home:  Schedule regular health, dental, and eye exams.  Stay current with your immunizations.  Do not use any tobacco products including cigarettes, chewing tobacco, or electronic cigarettes.  If you are pregnant, do not drink  alcohol.  If you are breastfeeding, limit how much and how often you drink alcohol.  Limit alcohol intake to no more than 1 drink per day for nonpregnant women. One drink equals 12 ounces of beer, 5 ounces of wine, or 1 ounces of hard liquor.  Do not use street drugs.  Do not share needles.  Ask your health care provider for help if you need support or information about quitting drugs.  Tell your health care provider if you often feel depressed.  Tell your health care provider if you have ever been abused or do not feel safe at home. This information is not intended to replace advice given to you by your health care provider. Make sure you discuss any questions you have with your health care provider. Document Released: 02/20/2011 Document Revised: 01/13/2016 Document Reviewed: 05/11/2015 Elsevier Interactive Patient Education  [employer].

## 2017-09-07 LAB — HEPATITIS C ANTIBODY
Hepatitis C Ab: NONREACTIVE
SIGNAL TO CUT-OFF: 0.02 (ref <1.00)

## 2017-10-01 ENCOUNTER — Telehealth: Payer: Self-pay | Admitting: Family Medicine

## 2017-10-01 ENCOUNTER — Other Ambulatory Visit: Payer: Self-pay | Admitting: Emergency Medicine

## 2017-10-01 MED ORDER — TRAMADOL HCL 50 MG PO TABS: 50.0000 mg | ORAL_TABLET | Freq: Three times a day (TID) | ORAL | 1 refills | Status: DC | PRN

## 2017-10-01 NOTE — Telephone Encounter (Signed)
Received refill request for traMADol (ULTRAM) 50 MG tablet. Last office visit 09/06/2017 and last refill 07/09/17. Is it ok to refill? Please advise.

## 2017-10-01 NOTE — Telephone Encounter (Signed)
Rx refill request:   Tramadol 50 mg - last filled 07/09/17  Lipitor 20 mg - historical medication- new Rx for provider  (lab 09/06/17)  LOV: 09/06/17  Pharmacy: verified

## 2017-10-01 NOTE — Telephone Encounter (Signed)
Copied from [place]. Topic: Quick Communication - Rx Refill/Question >> Oct 01, 2017  9:46 AM Ether Griffins B wrote: Medication: refill on Tramadol and a new script for Lipitor   Has the patient contacted their pharmacy? Yes.     (Agent: If no, request that the patient contact the pharmacy for the refill.)   Preferred Pharmacy (with phone number or street name): [id number] - [place], [place] - [address] AT [place]: Please be advised that RX refills may take up to 3 business days. We ask that you follow-up with your pharmacy.

## 2017-10-05 ENCOUNTER — Other Ambulatory Visit: Payer: Self-pay | Admitting: Emergency Medicine

## 2017-10-05 MED ORDER — ATORVASTATIN CALCIUM 20 MG PO TABS: 20.0000 mg | ORAL_TABLET | Freq: Every day | ORAL | 3 refills | Status: DC

## 2017-10-05 NOTE — Telephone Encounter (Signed)
Received refill request for atorvastatin (LIPITOR) 20 MG  Tablet.  Pt has Allergy/Contraindication: Statins. Is to ok to refill? Please advise.

## 2017-10-05 NOTE — Telephone Encounter (Signed)
Tramadol refill sent on 10/01/17. Lipitor refill request sent to provider for approval due to Allergy/Contraindication: Statins. Awaiting approval.

## 2017-10-10 DIAGNOSIS — F330 Major depressive disorder, recurrent, mild: Secondary | ICD-10-CM | POA: Diagnosis not present

## 2017-10-10 DIAGNOSIS — F451 Undifferentiated somatoform disorder: Secondary | ICD-10-CM | POA: Diagnosis not present

## 2017-10-10 DIAGNOSIS — F341 Dysthymic disorder: Secondary | ICD-10-CM | POA: Diagnosis not present

## 2017-10-16 ENCOUNTER — Emergency Department (INDEPENDENT_AMBULATORY_CARE_PROVIDER_SITE_OTHER)
Admission: EM | Admit: 2017-10-16 | Discharge: 2017-10-16 | Disposition: A | Discharge status: Home / Self Care | Payer: Medicare Other | Source: Home / Self Care | Attending: Family Medicine | Admitting: Family Medicine

## 2017-10-16 ENCOUNTER — Emergency Department (INDEPENDENT_AMBULATORY_CARE_PROVIDER_SITE_OTHER): Payer: Medicare Other

## 2017-10-16 ENCOUNTER — Other Ambulatory Visit: Payer: Self-pay

## 2017-10-16 ENCOUNTER — Encounter: Payer: Self-pay | Admitting: *Deleted

## 2017-10-16 DIAGNOSIS — R69 Illness, unspecified: Secondary | ICD-10-CM | POA: Diagnosis not present

## 2017-10-16 DIAGNOSIS — J441 Chronic obstructive pulmonary disease with (acute) exacerbation: Secondary | ICD-10-CM | POA: Diagnosis not present

## 2017-10-16 DIAGNOSIS — J111 Influenza due to unidentified influenza virus with other respiratory manifestations: Secondary | ICD-10-CM

## 2017-10-16 DIAGNOSIS — R05 Cough: Secondary | ICD-10-CM

## 2017-10-16 DIAGNOSIS — R509 Fever, unspecified: Secondary | ICD-10-CM

## 2017-10-16 MED ORDER — OSELTAMIVIR PHOSPHATE 75 MG PO CAPS: 75.0000 mg | ORAL_CAPSULE | Freq: Two times a day (BID) | ORAL | 0 refills | Status: DC

## 2017-10-16 MED ORDER — BENZONATATE 200 MG PO CAPS: ORAL_CAPSULE | ORAL | 0 refills | Status: DC

## 2017-10-16 MED ORDER — PREDNISONE 20 MG PO TABS: ORAL_TABLET | ORAL | 0 refills | Status: DC

## 2017-10-16 MED ORDER — DOXYCYCLINE HYCLATE 100 MG PO CAPS: 100.0000 mg | ORAL_CAPSULE | Freq: Two times a day (BID) | ORAL | 0 refills | Status: DC

## 2017-10-16 NOTE — ED Triage Notes (Signed)
Pt c/o RT ear ache and sinus HA x 2 days with productive cough, fever up to 102 and chills x last night. Last dose Tylenol at 0600 today. Hx of pneumonia and COPD.
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
## 2017-10-16 NOTE — Discharge Instructions (Signed)
Take plain guaifenesin (1200mg extended release tabs such as Mucinex) twice daily, with plenty of water, for cough and congestion.  Get adequate rest.   °May use Afrin nasal spray (or generic oxymetazoline) each morning for about 5 days and then discontinue.  Also recommend using saline nasal spray several times daily and saline nasal irrigation (AYR is a common brand).  Use Flonase nasal spray each morning after using Afrin nasal spray and saline nasal irrigation. °Try warm salt water gargles for sore throat.  °Stop all antihistamines for now, and other non-prescription cough/cold preparations. °May take Delsym Cough Suppressant with Tessalon at bedtime for nighttime cough.  °

## 2017-10-29 DIAGNOSIS — E039 Hypothyroidism, unspecified: Secondary | ICD-10-CM | POA: Diagnosis not present
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
## 2017-11-01 ENCOUNTER — Ambulatory Visit (INDEPENDENT_AMBULATORY_CARE_PROVIDER_SITE_OTHER): Payer: Medicare Other | Admitting: *Deleted

## 2017-11-01 ENCOUNTER — Encounter: Payer: Self-pay | Admitting: Family Medicine

## 2017-11-01 ENCOUNTER — Ambulatory Visit (INDEPENDENT_AMBULATORY_CARE_PROVIDER_SITE_OTHER): Payer: Medicare Other | Admitting: Family Medicine

## 2017-11-01 ENCOUNTER — Encounter: Payer: Self-pay | Admitting: *Deleted

## 2017-11-01 VITALS — BP 120/72 | HR 74 | Ht 67.0 in | Wt 278.4 lb

## 2017-11-01 VITALS — BP 120/72 | HR 74 | Ht 67.0 in | Wt 278.0 lb

## 2017-11-01 DIAGNOSIS — Z0000 Encounter for general adult medical examination without abnormal findings: Secondary | ICD-10-CM | POA: Diagnosis not present

## 2017-11-01 DIAGNOSIS — R05 Cough: Secondary | ICD-10-CM | POA: Diagnosis not present

## 2017-11-01 DIAGNOSIS — R059 Cough, unspecified: Secondary | ICD-10-CM

## 2017-11-01 NOTE — Patient Instructions (Signed)
It was good to see you today- your lungs sound good, I think you are on the right track.  Please let me know if you do not continue to gradually improve- alert me right away if you are getting worse

## 2017-11-01 NOTE — Patient Instructions (Signed)
Continue to eat heart healthy diet (full of fruits, vegetables, whole grains, lean protein, water--limit salt, fat, and sugar intake) and increase physical activity as tolerated.  Continue doing brain stimulating activities (puzzles, reading, adult coloring books, staying active) to keep memory sharp.    Shelly Mosley , Thank you for taking time to come for your Medicare Wellness Visit. I appreciate your ongoing commitment to your health goals. Please review the following plan we discussed and let me know if I can assist you in the future.   These are the goals we discussed: Goals    . Weight (lb) < 250 lb (113.4 kg)     Using portion control and doing chair yoga at least 2x/week       This is a list of the screening recommended for you and due dates:  Health Maintenance  Topic Date Due  . Tetanus Vaccine  02[birth year]24/1965  . Colon Cancer Screening  06/12/2017  . Mammogram  03/21/2018  . Flu Shot  Completed  . DEXA scan (bone density measurement)  Completed  .  Hepatitis C: One time screening is recommended by Center for Disease Control  (CDC) for  adults born from [birth year] through 1965.   Completed  . Pneumonia vaccines  Completed

## 2017-11-09 ENCOUNTER — Other Ambulatory Visit: Payer: Self-pay | Admitting: Emergency Medicine

## 2017-11-09 ENCOUNTER — Telehealth: Payer: Self-pay | Admitting: Family Medicine

## 2017-11-09 DIAGNOSIS — I890 Lymphedema, not elsewhere classified: Secondary | ICD-10-CM

## 2017-11-09 DIAGNOSIS — I10 Essential (primary) hypertension: Secondary | ICD-10-CM

## 2017-11-09 MED ORDER — LOSARTAN POTASSIUM 50 MG PO TABS: 50.0000 mg | ORAL_TABLET | Freq: Two times a day (BID) | ORAL | 3 refills | Status: DC

## 2017-11-09 MED ORDER — FUROSEMIDE 80 MG PO TABS: 80.0000 mg | ORAL_TABLET | Freq: Every day | ORAL | 1 refills | Status: DC

## 2017-11-09 NOTE — Telephone Encounter (Signed)
Pt requesting 90 day refills on losartan (COZAAR) 50 MG tablet and furosemide (LASIX) 80 MG tablet.  Last office visit 11/01/17. In the system the last refill for lasix was 05/09/14 and 10/14/12 for losartan. Are you ok with me refilling these medications. Please advise.

## 2017-11-09 NOTE — Telephone Encounter (Signed)
Copied from [place]. Topic: Quick Communication - Rx Refill/Question >> Nov 09, 2017  1:16 PM Eulogio Bear J wrote: Medication: lasix 80 mg   and losartan 50 mg - theses were prescribed by the last md. New rx for Dr Lorelei Pont  Has the patient contacted their pharmacy? No. - new rx from this md  (Agent: If no, request that the patient contact the pharmacy for the refill.) Preferred Pharmacy (with phone number or street name): [facility] [phone]  Agent: Please be advised that RX refills may take up to 3 business days. We ask that you follow-up with your pharmacy.  Would like 90 day supply for both

## 2017-11-09 NOTE — Telephone Encounter (Signed)
Med requests from a historical provider:   Lasix  80 mg daily Losartan  50 mg bid  Provider  Janett Billow. Copland, [place]  [address] [place]  LOV  09/06/17  She is requesting 90 days for both.  Please review.

## 2017-11-12 ENCOUNTER — Telehealth: Payer: Self-pay

## 2017-11-12 NOTE — Telephone Encounter (Signed)
PA initiated for quantity limit exception (Pt's plan only covers 30 day supply of losartan). KEY: [id number]. Received real-time PA approval.   [id number];INOMVE:[id number];Review Type:Qty;Coverage Start Date:10/13/2017;Coverage End Date:11/12/2018;

## 2017-11-15 ENCOUNTER — Other Ambulatory Visit: Payer: Self-pay | Admitting: Emergency Medicine

## 2017-11-15 DIAGNOSIS — I10 Essential (primary) hypertension: Secondary | ICD-10-CM

## 2017-11-15 MED ORDER — LOSARTAN POTASSIUM 50 MG PO TABS: 50.0000 mg | ORAL_TABLET | Freq: Two times a day (BID) | ORAL | 2 refills | Status: DC

## 2017-12-12 ENCOUNTER — Ambulatory Visit (INDEPENDENT_AMBULATORY_CARE_PROVIDER_SITE_OTHER): Payer: Medicare Other | Admitting: Family Medicine

## 2017-12-12 ENCOUNTER — Other Ambulatory Visit: Payer: Self-pay

## 2017-12-12 ENCOUNTER — Emergency Department (INDEPENDENT_AMBULATORY_CARE_PROVIDER_SITE_OTHER)
Admission: EM | Admit: 2017-12-12 | Discharge: 2017-12-12 | Disposition: A | Discharge status: Home / Self Care | Payer: Medicare Other | Source: Home / Self Care | Attending: Emergency Medicine | Admitting: Emergency Medicine

## 2017-12-12 ENCOUNTER — Emergency Department (INDEPENDENT_AMBULATORY_CARE_PROVIDER_SITE_OTHER): Payer: Medicare Other

## 2017-12-12 ENCOUNTER — Encounter: Payer: Self-pay | Admitting: Family Medicine

## 2017-12-12 DIAGNOSIS — M79641 Pain in right hand: Secondary | ICD-10-CM | POA: Diagnosis not present

## 2017-12-12 DIAGNOSIS — R0602 Shortness of breath: Secondary | ICD-10-CM | POA: Diagnosis not present

## 2017-12-12 DIAGNOSIS — S8991XA Unspecified injury of right lower leg, initial encounter: Secondary | ICD-10-CM | POA: Diagnosis not present

## 2017-12-12 DIAGNOSIS — R05 Cough: Secondary | ICD-10-CM | POA: Diagnosis not present

## 2017-12-12 DIAGNOSIS — S62346A Nondisplaced fracture of base of fifth metacarpal bone, right hand, initial encounter for closed fracture: Secondary | ICD-10-CM | POA: Diagnosis not present

## 2017-12-12 DIAGNOSIS — M25561 Pain in right knee: Secondary | ICD-10-CM | POA: Diagnosis not present

## 2017-12-12 DIAGNOSIS — S62316A Displaced fracture of base of fifth metacarpal bone, right hand, initial encounter for closed fracture: Secondary | ICD-10-CM

## 2017-12-12 DIAGNOSIS — R062 Wheezing: Secondary | ICD-10-CM

## 2017-12-12 DIAGNOSIS — J441 Chronic obstructive pulmonary disease with (acute) exacerbation: Secondary | ICD-10-CM

## 2017-12-12 DIAGNOSIS — W19XXXA Unspecified fall, initial encounter: Secondary | ICD-10-CM

## 2017-12-12 DIAGNOSIS — J181 Lobar pneumonia, unspecified organism: Secondary | ICD-10-CM | POA: Diagnosis not present

## 2017-12-12 DIAGNOSIS — J189 Pneumonia, unspecified organism: Secondary | ICD-10-CM

## 2017-12-12 DIAGNOSIS — R058 Other specified cough: Secondary | ICD-10-CM

## 2017-12-12 MED ORDER — IPRATROPIUM-ALBUTEROL 0.5-2.5 (3) MG/3ML IN SOLN
3.0000 mL | Freq: Once | RESPIRATORY_TRACT | Status: AC
  Administered 2017-12-12: 3 mL via RESPIRATORY_TRACT

## 2017-12-12 MED ORDER — PREDNISONE 10 MG (21) PO TBPK: ORAL_TABLET | ORAL | 0 refills | Status: DC

## 2017-12-12 MED ORDER — AZITHROMYCIN 250 MG PO TABS: ORAL_TABLET | ORAL | 0 refills | Status: DC

## 2017-12-12 MED ORDER — METHYLPREDNISOLONE ACETATE 80 MG/ML IJ SUSP
80.0000 mg | INTRAMUSCULAR | Status: AC
  Administered 2017-12-12: 80 mg via INTRAMUSCULAR

## 2017-12-12 NOTE — ED Triage Notes (Signed)
Pt c/o SOB and some congestion with cough x 4 days. Also says ears feel stopped up. Pt fell in parking lot upon arriving. Says she felt dizzy but did not lose consciousness. Has scrape on L hand due to fall. O2 SAT when taking vitals was 88% so we started pt on oxygen.

## 2017-12-12 NOTE — Progress Notes (Signed)
   Subjective:    I'm seeing this patient as a consultation for:  Dr Burnett Harry  CC: Right Hand Fracture  HPI: Shelly Mosley was on her way to urgent care this morning for what resulted in a COPD exacerbation.  In the parking lot she tripped and fell landing on her right hand.  She suffered an abrasion to her right hand but noted pain and swelling.  She had an x-ray that showed a transverse nondisplaced fracture through the base of the fifth metacarpal.  I was asked to provide treatment.  She notes some pain but denies any radiating pain weakness or numbness fevers or chills.  She feels pretty well on her hand otherwise.   Past medical history, Surgical history, Family history not pertinant except as noted below, Social history, Allergies, and medications have been entered into the medical record, reviewed, and no changes needed.   Review of Systems: No headache, visual changes, nausea, vomiting, diarrhea, constipation, dizziness, abdominal pain, skin rash, , chills, night sweats, weight loss, swollen lymph nodes, body aches, other joint swelling, muscle aches,  mood changes, visual or auditory hallucinations.   Objective:   BP (!) 123/94 (BP Location: Left Arm)   Pulse 97   Temp 99.7 F (37.6 C)   Resp 18   LMP 11/20/2011 (Approximate)   SpO2 94%    General: Well Developed, well nourished, and in no acute distress.  Neuro/Psych: Alert and oriented x3, extra-ocular muscles intact, able to move all 4 extremities, sensation grossly intact. Skin: Warm and dry, no rashes noted.  Respiratory: Not using accessory muscles, speaking in full sentences, trachea midline.  Cardiovascular: Pulses palpable, no extremity edema. Abdomen: Does not appear distended. MSK: Right hand abrasion along the ulnar aspect of the hand with no obvious deformities.  No significant rotational deformity.  Tender to palpation at the base of the fifth metacarpal.  Pulses capillary refill and sensation are intact.  Patient was  fitted with a well-formed ulnar gutter splint  EXAM: RIGHT HAND - COMPLETE 3+ VIEW  COMPARISON:  None.  FINDINGS: Minimal degenerate change of the radiocarpal joint, distal radioulnar joint and first carpometacarpal joints. Subtle transverse fracture without significant displacement over the base of the fifth metacarpal.  IMPRESSION: Subtle transverse fracture without significant displacement base of the fifth metacarpal.   Electronically Signed   By: Marin Olp M.D.   On: 12/12/2017 12:09   I personally (independently) visualized and performed the interpretation of the right hand x-ray images attached in this note.  Impression and Recommendations:    Assessment and Plan: 72 y.o. female with transverse fracture at the base of the fifth metacarpal.  No rotational deformity noted.  Good alignment on x-ray.  Patient was placed in a well-formed ulnar gutter splint.  Plan to recheck next week and repeat x-rays and likely placement to a fiberglass or Exos cast. Follow-up with PCP as directed by urgent care for COPD exacerbation follow-up. Recheck sooner if needed.   Discussed warning signs or symptoms. Please see discharge instructions. Patient expresses understanding.  CC: Copland, Gay Filler, MD

## 2017-12-12 NOTE — Discharge Instructions (Addendum)
For your right hand fracture, Dr. Lynne Leader came and placed splint.  Please see attached splint care instruction sheet.  You need to call and make an appointment to recheck right hand fracture with Dr. Georgann Housekeeper in 1 week. X-ray right knee negative. Chest x-ray shows possible early, mild left-sided pneumonia.  Today, we gave you a shot of Depo-Medrol for exacerbation of COPD and DuoNeb nebulizer treatment, which helped. I am prescribing azithromycin for antibiotic and oral prednisone.-Please take as directed For pain relief for the fracture, you may use the tramadol that you have at home as needed for severe pain.  For mild to moderate pain or fever, okay to use Tylenol.  Okay to use Mucinex for expectorant. You need to follow-up with your PCP or your lung specialist within 2 days for recheck.  Very important! Please discuss with your PCP or lung specialist about possible prescription for home nebulizer treatments. If any severely worsening symptoms, go to emergency room immediately. Your sister is driving you home . rest at home, drink plenty of liquids.
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
## 2017-12-13 ENCOUNTER — Ambulatory Visit: Payer: Medicare Other | Admitting: Obstetrics and Gynecology
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
## 2017-12-17 ENCOUNTER — Encounter: Payer: Self-pay | Admitting: Family Medicine

## 2017-12-17 ENCOUNTER — Ambulatory Visit (INDEPENDENT_AMBULATORY_CARE_PROVIDER_SITE_OTHER): Payer: Medicare Other | Admitting: Family Medicine

## 2017-12-17 VITALS — BP 134/79 | HR 85 | Resp 20 | Ht 67.0 in | Wt 274.8 lb

## 2017-12-17 DIAGNOSIS — J189 Pneumonia, unspecified organism: Secondary | ICD-10-CM

## 2017-12-17 DIAGNOSIS — R05 Cough: Secondary | ICD-10-CM | POA: Diagnosis not present

## 2017-12-17 DIAGNOSIS — J181 Lobar pneumonia, unspecified organism: Secondary | ICD-10-CM

## 2017-12-17 DIAGNOSIS — S62346A Nondisplaced fracture of base of fifth metacarpal bone, right hand, initial encounter for closed fracture: Secondary | ICD-10-CM | POA: Diagnosis not present

## 2017-12-17 DIAGNOSIS — R059 Cough, unspecified: Secondary | ICD-10-CM

## 2017-12-17 MED ORDER — HYDROCODONE-ACETAMINOPHEN 5-325 MG PO TABS: 1.0000 | ORAL_TABLET | Freq: Three times a day (TID) | ORAL | 0 refills | Status: AC | PRN

## 2017-12-17 MED FILL — HYDROCODON-APAP 5-325: 5-325 | 5 days supply | Qty: 15 | Fill #0

## 2017-12-17 NOTE — Patient Instructions (Addendum)
It was good to see you today- I am sorry about your recent injury and illness!  Ask the pharmacist downstairs for something like afrin for you to use for your nasal congestion I also gave you a few vicodin pills to take as needed for cough and hand pain.  When you use these, please don't also take tramadol   Let's plan to visit in 3-4 months for a recheck

## 2017-12-19 DIAGNOSIS — H43811 Vitreous degeneration, right eye: Secondary | ICD-10-CM | POA: Diagnosis not present

## 2017-12-19 DIAGNOSIS — H2513 Age-related nuclear cataract, bilateral: Secondary | ICD-10-CM | POA: Diagnosis not present

## 2017-12-19 DIAGNOSIS — H02889 Meibomian gland dysfunction of unspecified eye, unspecified eyelid: Secondary | ICD-10-CM | POA: Diagnosis not present

## 2017-12-19 DIAGNOSIS — H25013 Cortical age-related cataract, bilateral: Secondary | ICD-10-CM | POA: Diagnosis not present

## 2017-12-19 DIAGNOSIS — M3501 Sicca syndrome with keratoconjunctivitis: Secondary | ICD-10-CM | POA: Diagnosis not present

## 2017-12-21 ENCOUNTER — Encounter: Payer: Self-pay | Admitting: Family Medicine

## 2017-12-21 ENCOUNTER — Ambulatory Visit (INDEPENDENT_AMBULATORY_CARE_PROVIDER_SITE_OTHER): Payer: Medicare Other

## 2017-12-21 ENCOUNTER — Ambulatory Visit (INDEPENDENT_AMBULATORY_CARE_PROVIDER_SITE_OTHER): Payer: Medicare Other | Admitting: Family Medicine

## 2017-12-21 VITALS — BP 95/58 | HR 80 | Ht 67.0 in | Wt 274.0 lb

## 2017-12-21 DIAGNOSIS — S62346A Nondisplaced fracture of base of fifth metacarpal bone, right hand, initial encounter for closed fracture: Secondary | ICD-10-CM

## 2017-12-21 DIAGNOSIS — S62366A Nondisplaced fracture of neck of fifth metacarpal bone, right hand, initial encounter for closed fracture: Secondary | ICD-10-CM | POA: Diagnosis not present

## 2017-12-21 DIAGNOSIS — W19XXXD Unspecified fall, subsequent encounter: Secondary | ICD-10-CM

## 2017-12-21 DIAGNOSIS — S62346D Nondisplaced fracture of base of fifth metacarpal bone, right hand, subsequent encounter for fracture with routine healing: Secondary | ICD-10-CM | POA: Diagnosis not present

## 2017-12-21 NOTE — Patient Instructions (Signed)
Thank you for coming in today. Recheck in 2 weeks.  Return sooner if needed.  Ok to get this wet.  Keep the inside dry.
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
## 2017-12-24 ENCOUNTER — Ambulatory Visit (INDEPENDENT_AMBULATORY_CARE_PROVIDER_SITE_OTHER)
Admission: RE | Admit: 2017-12-24 | Discharge: 2017-12-24 | Disposition: A | Discharge status: Home / Self Care | Payer: Medicare Other | Source: Ambulatory Visit | Attending: Acute Care | Admitting: Acute Care

## 2017-12-24 ENCOUNTER — Other Ambulatory Visit: Payer: Self-pay | Admitting: Family Medicine

## 2017-12-24 ENCOUNTER — Encounter: Payer: Self-pay | Admitting: Acute Care

## 2017-12-24 ENCOUNTER — Ambulatory Visit (INDEPENDENT_AMBULATORY_CARE_PROVIDER_SITE_OTHER): Payer: Medicare Other | Admitting: Acute Care

## 2017-12-24 VITALS — BP 126/76 | HR 91 | Ht 65.75 in | Wt 272.0 lb

## 2017-12-24 DIAGNOSIS — R0602 Shortness of breath: Secondary | ICD-10-CM | POA: Diagnosis not present

## 2017-12-24 DIAGNOSIS — J42 Unspecified chronic bronchitis: Secondary | ICD-10-CM

## 2017-12-24 DIAGNOSIS — J189 Pneumonia, unspecified organism: Secondary | ICD-10-CM

## 2017-12-24 DIAGNOSIS — R05 Cough: Secondary | ICD-10-CM | POA: Diagnosis not present

## 2017-12-24 DIAGNOSIS — G6289 Other specified polyneuropathies: Secondary | ICD-10-CM

## 2017-12-24 MED ORDER — ALBUTEROL SULFATE HFA 108 (90 BASE) MCG/ACT IN AERS: 1.0000 | INHALATION_SPRAY | Freq: Four times a day (QID) | RESPIRATORY_TRACT | 3 refills | Status: DC | PRN

## 2017-12-24 MED ORDER — HYDROCOD POLST-CPM POLST ER 10-8 MG/5ML PO SUER: 5.0000 mL | Freq: Every day | ORAL | 0 refills | Status: DC

## 2017-12-24 MED ORDER — BUDESONIDE-FORMOTEROL FUMARATE 80-4.5 MCG/ACT IN AERO: 2.0000 | INHALATION_SPRAY | Freq: Two times a day (BID) | RESPIRATORY_TRACT | 11 refills | Status: DC
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
## 2017-12-24 NOTE — Patient Instructions (Addendum)
It is nice to meet you today. We will send in a prescription for Tussionex 5 cc's at bedtime only. Do not drive if sleepy. CXR today ( Resolution of infiltrate) We will call you with results. We will prescribe a rescue inhaler( Pro Air) Use as needed for breakthrough shortness of breath up to 4 times a day. We will start you on Symbicort 80 Try 1 puff once daily Rinse mouth after use. Mucinex 1200 mg daily if CXR still shows pneumonia. Follow up in 2 months to see how you are doing. Remember oxygen sats should be between 88-92% Note your daily symptoms > remember "red flags" for COPD:  Increase in cough, increase in sputum production, increase in shortness of breath or activity tolerance. If you notice these symptoms, please call to be seen.   Please contact office for sooner follow up if symptoms do not improve or worsen or seek emergency care

## 2017-12-24 NOTE — Assessment & Plan Note (Addendum)
Recent pnrumonia Treated with Z pack and Pred taper Plan: CXR today to confirm resolution Tussionex 5 cc at bedtime for cough Do not drive if sleepy

## 2017-12-24 NOTE — Assessment & Plan Note (Signed)
Not on maintenance inhaler due to eye issues 2/2 thyroid disorder Plan: We will call you with results. We will prescribe a rescue inhaler( Pro Air) Use as needed for breakthrough shortness of breath up to 4 times a day. We will start you on Symbicort 80 Try 1 puff once daily Rinse mouth after use. Mucinex 1200 mg daily if CXR still shows pneumonia. Follow up in 2 months to see how you are doing. Remember oxygen sats should be between 88-92% Note your daily symptoms > remember "red flags" for COPD:  Increase in cough, increase in sputum production, increase in shortness of breath or activity tolerance. If you notice these symptoms, please call to be seen.   Please contact office for sooner follow up if symptoms do not improve or worsen or seek emergency care

## 2017-12-25 NOTE — Telephone Encounter (Signed)
Received refill request for traMADol (ULTRAM) 50 MG tablet. Last office visit 12/17/17 and last refill 10/01/17.

## 2017-12-31 ENCOUNTER — Telehealth: Payer: Self-pay | Admitting: Acute Care

## 2017-12-31 NOTE — Telephone Encounter (Signed)
Called and spoke with patient, she is requesting results from the xray on 5.6.19. SG please advise, thanks.

## 2018-01-01 DIAGNOSIS — F451 Undifferentiated somatoform disorder: Secondary | ICD-10-CM | POA: Diagnosis not present

## 2018-01-01 DIAGNOSIS — F330 Major depressive disorder, recurrent, mild: Secondary | ICD-10-CM | POA: Diagnosis not present

## 2018-01-01 DIAGNOSIS — F341 Dysthymic disorder: Secondary | ICD-10-CM | POA: Diagnosis not present

## 2018-01-01 MED ORDER — DOXYCYCLINE HYCLATE 100 MG PO TABS: 100.0000 mg | ORAL_TABLET | Freq: Two times a day (BID) | ORAL | 0 refills | Status: DC

## 2018-01-01 NOTE — Telephone Encounter (Signed)
Per SG: Please let her know there pneumonia is not completely resolved. Please order doxy 100 mg twice daily for 7 days. Sunblock with sun exposure. Follow up in 3-4 weeks with CXR. Thanks  ------- Spoke with pt, aware of results/recs.  Rx sent to preferred pharmacy.  Nothing further needed.

## 2018-01-04 ENCOUNTER — Ambulatory Visit (INDEPENDENT_AMBULATORY_CARE_PROVIDER_SITE_OTHER): Payer: Medicare Other | Admitting: Family Medicine

## 2018-01-04 ENCOUNTER — Encounter: Payer: Self-pay | Admitting: Family Medicine

## 2018-01-04 ENCOUNTER — Ambulatory Visit (INDEPENDENT_AMBULATORY_CARE_PROVIDER_SITE_OTHER): Payer: Medicare Other

## 2018-01-04 VITALS — BP 110/72 | HR 84 | Ht 67.0 in | Wt 271.0 lb

## 2018-01-04 DIAGNOSIS — X58XXXD Exposure to other specified factors, subsequent encounter: Secondary | ICD-10-CM

## 2018-01-04 DIAGNOSIS — S62316A Displaced fracture of base of fifth metacarpal bone, right hand, initial encounter for closed fracture: Secondary | ICD-10-CM | POA: Diagnosis not present

## 2018-01-04 DIAGNOSIS — S62346A Nondisplaced fracture of base of fifth metacarpal bone, right hand, initial encounter for closed fracture: Secondary | ICD-10-CM
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
## 2018-01-04 NOTE — Patient Instructions (Signed)
Thank you for coming in today. Recheck in 1 week.  Return sooner if needed.

## 2018-01-11 ENCOUNTER — Encounter: Payer: Self-pay | Admitting: Family Medicine

## 2018-01-11 ENCOUNTER — Ambulatory Visit (INDEPENDENT_AMBULATORY_CARE_PROVIDER_SITE_OTHER): Payer: Medicare Other

## 2018-01-11 ENCOUNTER — Ambulatory Visit (INDEPENDENT_AMBULATORY_CARE_PROVIDER_SITE_OTHER): Payer: Medicare Other | Admitting: Family Medicine

## 2018-01-11 VITALS — BP 104/68 | HR 81 | Wt 270.0 lb

## 2018-01-11 DIAGNOSIS — S62346A Nondisplaced fracture of base of fifth metacarpal bone, right hand, initial encounter for closed fracture: Secondary | ICD-10-CM | POA: Diagnosis not present

## 2018-01-11 DIAGNOSIS — S62346D Nondisplaced fracture of base of fifth metacarpal bone, right hand, subsequent encounter for fracture with routine healing: Secondary | ICD-10-CM | POA: Diagnosis not present

## 2018-01-11 DIAGNOSIS — S62201A Unspecified fracture of first metacarpal bone, right hand, initial encounter for closed fracture: Secondary | ICD-10-CM | POA: Diagnosis not present

## 2018-01-11 DIAGNOSIS — X58XXXD Exposure to other specified factors, subsequent encounter: Secondary | ICD-10-CM

## 2018-01-11 NOTE — Patient Instructions (Signed)
Thank you for coming in today. Recheck 2 weeks.
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
## 2018-01-18 ENCOUNTER — Ambulatory Visit: Payer: Medicare Other | Admitting: Obstetrics and Gynecology

## 2018-01-25 ENCOUNTER — Other Ambulatory Visit: Payer: Self-pay | Admitting: Family Medicine

## 2018-01-25 ENCOUNTER — Ambulatory Visit (INDEPENDENT_AMBULATORY_CARE_PROVIDER_SITE_OTHER): Payer: Medicare Other

## 2018-01-25 ENCOUNTER — Ambulatory Visit (INDEPENDENT_AMBULATORY_CARE_PROVIDER_SITE_OTHER): Payer: Medicare Other | Admitting: Family Medicine

## 2018-01-25 ENCOUNTER — Encounter: Payer: Self-pay | Admitting: Family Medicine

## 2018-01-25 VITALS — BP 120/56 | HR 78 | Ht 67.0 in | Wt 269.0 lb

## 2018-01-25 DIAGNOSIS — S62346A Nondisplaced fracture of base of fifth metacarpal bone, right hand, initial encounter for closed fracture: Secondary | ICD-10-CM | POA: Diagnosis not present

## 2018-01-25 DIAGNOSIS — X58XXXD Exposure to other specified factors, subsequent encounter: Secondary | ICD-10-CM

## 2018-01-25 DIAGNOSIS — S62346D Nondisplaced fracture of base of fifth metacarpal bone, right hand, subsequent encounter for fracture with routine healing: Secondary | ICD-10-CM

## 2018-01-25 DIAGNOSIS — S62316A Displaced fracture of base of fifth metacarpal bone, right hand, initial encounter for closed fracture: Secondary | ICD-10-CM | POA: Diagnosis not present

## 2018-01-25 NOTE — Patient Instructions (Addendum)
Thank you for coming in today. Ok to wash your hand out of the cast.  When out of the cast be very careful Buddy tape the fingers out of the cast.  Continue to use the cast almost all the time.  Recheck in 2 weeks.

## 2018-01-25 NOTE — Progress Notes (Signed)
Xray ordered per last OV note
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
## 2018-02-08 ENCOUNTER — Encounter: Payer: Self-pay | Admitting: Family Medicine

## 2018-02-08 ENCOUNTER — Ambulatory Visit (INDEPENDENT_AMBULATORY_CARE_PROVIDER_SITE_OTHER): Payer: Medicare Other

## 2018-02-08 ENCOUNTER — Other Ambulatory Visit: Payer: Self-pay | Admitting: Family Medicine

## 2018-02-08 ENCOUNTER — Ambulatory Visit (INDEPENDENT_AMBULATORY_CARE_PROVIDER_SITE_OTHER): Payer: Medicare Other | Admitting: Family Medicine

## 2018-02-08 VITALS — BP 107/67 | HR 70 | Wt 271.0 lb

## 2018-02-08 DIAGNOSIS — X58XXXD Exposure to other specified factors, subsequent encounter: Secondary | ICD-10-CM

## 2018-02-08 DIAGNOSIS — S62306A Unspecified fracture of fifth metacarpal bone, right hand, initial encounter for closed fracture: Secondary | ICD-10-CM | POA: Diagnosis not present

## 2018-02-08 DIAGNOSIS — S62346D Nondisplaced fracture of base of fifth metacarpal bone, right hand, subsequent encounter for fracture with routine healing: Secondary | ICD-10-CM | POA: Diagnosis not present

## 2018-02-08 DIAGNOSIS — S62346A Nondisplaced fracture of base of fifth metacarpal bone, right hand, initial encounter for closed fracture: Secondary | ICD-10-CM | POA: Diagnosis not present

## 2018-02-08 DIAGNOSIS — T148XXA Other injury of unspecified body region, initial encounter: Secondary | ICD-10-CM

## 2018-02-08 NOTE — Patient Instructions (Addendum)
Thank you for coming in today. Recheck in 2 weeks Ok to remove the cast with bathing.  OK to remove the cast and do gentle finger motion.  Wear the cast with activity and with sleeping.    Trigger Finger Trigger finger (stenosing tenosynovitis) is a condition that causes a finger to get stuck in a bent position. Each finger has a tough, cord-like tissue that connects muscle to bone (tendon), and each tendon is surrounded by a tunnel of tissue (tendon sheath). To move your finger, your tendon needs to slide freely through the sheath. Trigger finger happens when the tendon or the sheath thickens, making it difficult to move your finger. Trigger finger can affect any finger or a thumb. It may affect more than one finger. Mild cases may clear up with rest and medicine. Severe cases require more treatment. What are the causes? Trigger finger is caused by a thickened finger tendon or tendon sheath. The cause of this thickening is not known. What increases the risk? The following factors may make you more likely to develop this condition:  Doing activities that require a strong grip.  Having rheumatoid arthritis, gout, or diabetes.  Being 39-36 years old.  Being a woman.  What are the signs or symptoms? Symptoms of this condition include:  Pain when bending or straightening your finger.  Tenderness or swelling where your finger attaches to the palm of your hand.  A lump in the palm of your hand or on the inside of your finger.  Hearing a popping sound when you try to straighten your finger.  Feeling a popping, catching, or locking sensation when you try to straighten your finger.  Being unable to straighten your finger.  How is this diagnosed? This condition is diagnosed based on your symptoms and a physical exam. How is this treated? This condition may be treated by:  Resting your finger and avoiding activities that make symptoms worse.  Wearing a finger splint to keep your  finger in a slightly bent position.  Taking NSAIDs to relieve pain and swelling.  Injecting medicine (steroids) into the tendon sheath to reduce swelling and irritation. Injections may need to be repeated.  Having surgery to open the tendon sheath. This may be done if other treatments do not work and you cannot straighten your finger. You may need physical therapy after surgery.  Follow these instructions at home:  Use moist heat to help reduce pain and swelling as told by your health care provider.  Rest your finger and avoid activities that make pain worse. Return to normal activities as told by your health care provider.  If you have a splint, wear it as told by your health care provider.  Take over-the-counter and prescription medicines only as told by your health care provider.  Keep all follow-up visits as told by your health care provider. This is important. Contact a health care provider if:  Your symptoms are not improving with home care. Summary  Trigger finger (stenosing tenosynovitis) causes your finger to get stuck in a bent position, and it can make it difficult and painful to straighten your finger.  This condition develops when a finger tendon or tendon sheath thickens.  Treatment starts with resting, wearing a splint, and taking NSAIDs.  In severe cases, surgery to open the tendon sheath may be needed. This information is not intended to replace advice given to you by your health care provider. Make sure you discuss any questions you have with your health care  provider. Document Released: 05/27/2004 Document Revised: 07/18/2016 Document Reviewed: 07/18/2016 Elsevier Interactive Patient Education  2017 [employer].
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
## 2018-02-12 ENCOUNTER — Other Ambulatory Visit: Payer: Self-pay | Admitting: Family Medicine

## 2018-02-12 DIAGNOSIS — G6289 Other specified polyneuropathies: Secondary | ICD-10-CM

## 2018-02-12 NOTE — Telephone Encounter (Signed)
Requesting:Tramdol Contract:none, needs csc NHA:FBXU, needs uds Last Visit:12/17/17 Next Visit:03/04/18 Last Refill:12/25/17  No discrepancies.   Please Advise

## 2018-02-14 ENCOUNTER — Telehealth: Payer: Self-pay | Admitting: Acute Care

## 2018-02-14 ENCOUNTER — Ambulatory Visit (INDEPENDENT_AMBULATORY_CARE_PROVIDER_SITE_OTHER)
Admission: RE | Admit: 2018-02-14 | Discharge: 2018-02-14 | Disposition: A | Discharge status: Home / Self Care | Payer: Medicare Other | Source: Ambulatory Visit | Attending: Acute Care | Admitting: Acute Care

## 2018-02-14 ENCOUNTER — Ambulatory Visit (INDEPENDENT_AMBULATORY_CARE_PROVIDER_SITE_OTHER): Payer: Medicare Other | Admitting: Acute Care

## 2018-02-14 ENCOUNTER — Encounter: Payer: Self-pay | Admitting: Acute Care

## 2018-02-14 VITALS — BP 120/78 | HR 90 | Ht 67.0 in | Wt 272.0 lb

## 2018-02-14 DIAGNOSIS — R05 Cough: Secondary | ICD-10-CM

## 2018-02-14 DIAGNOSIS — J42 Unspecified chronic bronchitis: Secondary | ICD-10-CM

## 2018-02-14 DIAGNOSIS — R059 Cough, unspecified: Secondary | ICD-10-CM

## 2018-02-14 DIAGNOSIS — R053 Chronic cough: Secondary | ICD-10-CM

## 2018-02-14 DIAGNOSIS — K219 Gastro-esophageal reflux disease without esophagitis: Secondary | ICD-10-CM | POA: Insufficient documentation

## 2018-02-14 LAB — POCT EXHALED NITRIC OXIDE: FENO LEVEL (PPB): 19

## 2018-02-14 MED ORDER — OMEPRAZOLE 20 MG PO CPDR: 20.0000 mg | DELAYED_RELEASE_CAPSULE | Freq: Every day | ORAL | 5 refills | Status: DC

## 2018-02-14 NOTE — Assessment & Plan Note (Addendum)
Has not resolved Non-productive CXR with 02/14/18>>Unchanged prominence of the pulmonary interstitium suggesting chronic interstitial lung disease. Treated x 2 with ABX and is unresolved Plan: Will order HRCT with prone and supine positioning and inspiratory and expiratory cuts to better evaluate for ILD. Continue Mucinex Delsym as needed for cough Close follow up after CT scan after full evaluation for ILD.

## 2018-02-14 NOTE — Telephone Encounter (Signed)
Based on cxr and also 2017 ct abd lung cut - is moderate chance she has real ILD. Yes, please order HRCT as you have written down

## 2018-02-14 NOTE — Patient Instructions (Addendum)
It is good to see you today. We will do a CXR today. Continue your Neti Pot and Zyrtec for allergies. For cough we will do a FENO We will prescribe a spacer to use with the Symbicort. We will teach you how to use this. We will add omeprazole 20 mg before dinner. Add pepcid 20 mg before breakfast. GERD Diet Sips of water instead of throat clearing Sugar Free Jolly Ranchers or Werther's originals for throat soothing. Delsym Cough syrup 5 cc's every 12 hours Sputum Culture as able Follow up in 1 month with Judson Roch NP or Dr. Lake Bells

## 2018-02-14 NOTE — Telephone Encounter (Signed)
Could you review the CXR's on this patient. She has had an infiltrate that has been slow to clear, and they are now suggesting ILD. She does have a non-productive cough. Only previous CT was 2012 and just had some scarring. I was planning on HRCT with supine and prone positioning , inspiratory and expiratory cuts.  Is that appropriate? Thanks

## 2018-02-14 NOTE — Telephone Encounter (Signed)
Thank you :)
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
## 2018-02-14 NOTE — Assessment & Plan Note (Signed)
+   GERD Takes TUMS prn, nothing on a regular basis ? If impacting cough/ suspected ILD Plan: We will add omeprazole 20 mg before dinner. Add pepcid 20 mg before breakfast. GERD Diet

## 2018-02-14 NOTE — Assessment & Plan Note (Addendum)
Not on maintenance inhaler due to eye issues 2/2 thyroid disorder Taking Symbicort every third day to help with shortness of breath Plan: CXR today We will call you with results Continue  rescue inhaler( Pro Air)  Use as needed for breakthrough shortness of breath up to 4 times a day. Continue  Symbicort 80, titrate as needed for eye issues Try 1 puff once daily>> reduced dose due to eye  issues( She is using every third day) Rinse mouth after use. We will prescribe a spacer to make sure administration is not triggering cough. We will teach you to use the spacer today. Mucinex 1200 mg daily if CXR still shows pneumonia. Follow up in 1 month to see how you are doing. Remember oxygen sats should be between 88-92% Note your daily symptoms >remember "red flags" for COPD: Increase in cough, increase in sputum production, increase in shortness of breath or activity intolerance. If you notice these symptoms, please call to be seen.  Please contact office for sooner follow up if symptoms do not improve or worsen or seek emergency care

## 2018-02-14 NOTE — Telephone Encounter (Signed)
Please call patient and let her know we will need to do a CT Chest as her CXR shows the area we have been watching  has not cleared. Please place an order for HRCT with prone and supine positioning with inspiratory and expiratory cuts. Bleitz or Entrikin to read.   Thanks so much.

## 2018-02-18 ENCOUNTER — Other Ambulatory Visit: Payer: Self-pay | Admitting: Acute Care

## 2018-02-18 DIAGNOSIS — E032 Hypothyroidism due to medicaments and other exogenous substances: Secondary | ICD-10-CM | POA: Diagnosis not present

## 2018-02-18 DIAGNOSIS — R918 Other nonspecific abnormal finding of lung field: Secondary | ICD-10-CM

## 2018-02-18 DIAGNOSIS — Z8639 Personal history of other endocrine, nutritional and metabolic disease: Secondary | ICD-10-CM | POA: Diagnosis not present

## 2018-02-18 NOTE — Telephone Encounter (Signed)
Advised pt of results. Pt understood and nothing further is needed.   HRCT scan ordered

## 2018-02-20 ENCOUNTER — Telehealth: Payer: Self-pay | Admitting: Acute Care

## 2018-02-20 ENCOUNTER — Ambulatory Visit (INDEPENDENT_AMBULATORY_CARE_PROVIDER_SITE_OTHER): Payer: Medicare Other

## 2018-02-20 DIAGNOSIS — J449 Chronic obstructive pulmonary disease, unspecified: Secondary | ICD-10-CM | POA: Diagnosis not present

## 2018-02-20 DIAGNOSIS — K760 Fatty (change of) liver, not elsewhere classified: Secondary | ICD-10-CM

## 2018-02-20 DIAGNOSIS — R918 Other nonspecific abnormal finding of lung field: Secondary | ICD-10-CM

## 2018-02-20 DIAGNOSIS — J439 Emphysema, unspecified: Secondary | ICD-10-CM | POA: Diagnosis not present

## 2018-02-20 DIAGNOSIS — I2510 Atherosclerotic heart disease of native coronary artery without angina pectoris: Secondary | ICD-10-CM | POA: Diagnosis not present

## 2018-02-20 NOTE — Telephone Encounter (Signed)
Can we Rx a spacer for pt? She uses Symbicort and SG saw her. Please advise.  I called pt but hd to leave message.  Patient Instructions by Magdalen Spatz, NP at 02/14/2018 9:00 AM  Author: Magdalen Spatz, NP Author Type: Nurse Practitioner Filed: 02/14/2018 10:09 AM  Note Status: Addendum Cosign: Cosign Not Required Encounter Date: 02/14/2018  Editor: Magdalen Spatz, NP (Nurse Practitioner)  Prior Versions: 1. Magdalen Spatz, NP (Nurse Practitioner) at 02/14/2018 10:08 AM - Addendum   2. Magdalen Spatz, NP (Nurse Practitioner) at 02/14/2018 10:06 AM - Addendum   3. Magdalen Spatz, NP (Nurse Practitioner) at 02/14/2018 10:01 AM - Addendum   4. Magdalen Spatz, NP (Nurse Practitioner) at 02/14/2018 9:56 AM - Signed    It is good to see you today. We will do a CXR today. Continue your Neti Pot and Zyrtec for allergies. For cough we will do a FENO We will prescribe a spacer to use with the Symbicort. We will teach you how to use this. We will add omeprazole 20 mg before dinner. Add pepcid 20 mg before breakfast. GERD Diet Sips of water instead of throat clearing Sugar Free Jolly Ranchers or Werther's originals for throat soothing. Delsym Cough syrup 5 cc's every 12 hours Sputum Culture as able Follow up in 1 month with Judson Roch NP or Dr. Lake Bells

## 2018-02-22 MED ORDER — AEROCHAMBER MV MISC: 0 refills | Status: DC

## 2018-02-22 NOTE — Telephone Encounter (Signed)
Per last OV, patient was to receive a spacer. Patient lives in [place]. Spoke with patient and she wishes to pick up spacer from the [facility] office. Advised her that I would send one out on Monday and she could stop by after 230. She verbalized understanding. Will place spacer in the [facility] bag.   Nothing else needed at time of call.

## 2018-02-25 ENCOUNTER — Ambulatory Visit (INDEPENDENT_AMBULATORY_CARE_PROVIDER_SITE_OTHER): Payer: Medicare Other | Admitting: Family Medicine

## 2018-02-25 ENCOUNTER — Other Ambulatory Visit: Payer: Self-pay | Admitting: Family Medicine

## 2018-02-25 ENCOUNTER — Ambulatory Visit (INDEPENDENT_AMBULATORY_CARE_PROVIDER_SITE_OTHER): Payer: Medicare Other

## 2018-02-25 ENCOUNTER — Telehealth: Payer: Self-pay | Admitting: Acute Care

## 2018-02-25 ENCOUNTER — Encounter: Payer: Self-pay | Admitting: Family Medicine

## 2018-02-25 VITALS — BP 118/69 | HR 84 | Ht 67.0 in | Wt 276.0 lb

## 2018-02-25 DIAGNOSIS — S62346D Nondisplaced fracture of base of fifth metacarpal bone, right hand, subsequent encounter for fracture with routine healing: Secondary | ICD-10-CM

## 2018-02-25 DIAGNOSIS — M25641 Stiffness of right hand, not elsewhere classified: Secondary | ICD-10-CM

## 2018-02-25 DIAGNOSIS — S62346A Nondisplaced fracture of base of fifth metacarpal bone, right hand, initial encounter for closed fracture: Secondary | ICD-10-CM

## 2018-02-25 DIAGNOSIS — W19XXXD Unspecified fall, subsequent encounter: Secondary | ICD-10-CM | POA: Diagnosis not present

## 2018-02-25 DIAGNOSIS — M65341 Trigger finger, right ring finger: Secondary | ICD-10-CM

## 2018-02-25 DIAGNOSIS — S62316A Displaced fracture of base of fifth metacarpal bone, right hand, initial encounter for closed fracture: Secondary | ICD-10-CM | POA: Diagnosis not present

## 2018-02-25 NOTE — Progress Notes (Signed)
Order placed per appt note for imaging prior to appt.

## 2018-02-25 NOTE — Patient Instructions (Addendum)
Thank you for coming in today. Attend Hand therapy.  Recheck in 3 weeks.  Buddy tape the fingers . Use aspercream on the ring finger \\If  not doing well return for injection.     Trigger Finger Trigger finger (stenosing tenosynovitis) is a condition that causes a finger to get stuck in a bent position. Each finger has a tough, cord-like tissue that connects muscle to bone (tendon), and each tendon is surrounded by a tunnel of tissue (tendon sheath). To move your finger, your tendon needs to slide freely through the sheath. Trigger finger happens when the tendon or the sheath thickens, making it difficult to move your finger. Trigger finger can affect any finger or a thumb. It may affect more than one finger. Mild cases may clear up with rest and medicine. Severe cases require more treatment. What are the causes? Trigger finger is caused by a thickened finger tendon or tendon sheath. The cause of this thickening is not known. What increases the risk? The following factors may make you more likely to develop this condition:  Doing activities that require a strong grip.  Having rheumatoid arthritis, gout, or diabetes.  Being 47-105 years old.  Being a woman.  What are the signs or symptoms? Symptoms of this condition include:  Pain when bending or straightening your finger.  Tenderness or swelling where your finger attaches to the palm of your hand.  A lump in the palm of your hand or on the inside of your finger.  Hearing a popping sound when you try to straighten your finger.  Feeling a popping, catching, or locking sensation when you try to straighten your finger.  Being unable to straighten your finger.  How is this diagnosed? This condition is diagnosed based on your symptoms and a physical exam. How is this treated? This condition may be treated by:  Resting your finger and avoiding activities that make symptoms worse.  Wearing a finger splint to keep your finger in a  slightly bent position.  Taking NSAIDs to relieve pain and swelling.  Injecting medicine (steroids) into the tendon sheath to reduce swelling and irritation. Injections may need to be repeated.  Having surgery to open the tendon sheath. This may be done if other treatments do not work and you cannot straighten your finger. You may need physical therapy after surgery.  Follow these instructions at home:  Use moist heat to help reduce pain and swelling as told by your health care provider.  Rest your finger and avoid activities that make pain worse. Return to normal activities as told by your health care provider.  If you have a splint, wear it as told by your health care provider.  Take over-the-counter and prescription medicines only as told by your health care provider.  Keep all follow-up visits as told by your health care provider. This is important. Contact a health care provider if:  Your symptoms are not improving with home care. Summary  Trigger finger (stenosing tenosynovitis) causes your finger to get stuck in a bent position, and it can make it difficult and painful to straighten your finger.  This condition develops when a finger tendon or tendon sheath thickens.  Treatment starts with resting, wearing a splint, and taking NSAIDs.  In severe cases, surgery to open the tendon sheath may be needed. This information is not intended to replace advice given to you by your health care provider. Make sure you discuss any questions you have with your health care provider. Document Released:  05/27/2004 Document Revised: 07/18/2016 Document Reviewed: 07/18/2016 Elsevier Interactive Patient Education  2017 [employer].
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
## 2018-02-25 NOTE — Telephone Encounter (Signed)
Please let patient know her CT chest showed minimal progression of her fibrotic disease since her 2012 scan. This is good. Let her know there was notation of 3 vessel CAD ( She is already on Lipitor) and Hepatic Steatosis. Hepatic steatosis. This  is a term that describes the build up of fat in the liver. It is normal to have small amounts of fat in your liver, but when the proportion of liver cells that contain fat exceeds more than 5% it is indicative of early stage fatty liver.Treatment often involves reducing risk factors through a diet and exercise plan. It is generally a benign condition, but in a small percentage of patients it does require follow up. Please have the patient follow up with PCP regarding potential risk factor modification, dietary therapy or pharmacologic therapy if clinically indicated. Have her follow the plan of care that was developed at the 02/14/2018 appointment, with follow up 03/18/2018 as scheduled.Thanks so much.

## 2018-02-25 NOTE — Telephone Encounter (Signed)
Advised pt of results. Pt understood and nothing further is needed. She will keep her appt on 03/15/2018.

## 2018-02-26 ENCOUNTER — Telehealth: Payer: Self-pay | Admitting: Acute Care

## 2018-02-26 DIAGNOSIS — Z8639 Personal history of other endocrine, nutritional and metabolic disease: Secondary | ICD-10-CM | POA: Diagnosis not present

## 2018-02-26 DIAGNOSIS — E039 Hypothyroidism, unspecified: Secondary | ICD-10-CM | POA: Diagnosis not present

## 2018-02-26 DIAGNOSIS — R5382 Chronic fatigue, unspecified: Secondary | ICD-10-CM | POA: Diagnosis not present

## 2018-02-26 DIAGNOSIS — E559 Vitamin D deficiency, unspecified: Secondary | ICD-10-CM | POA: Diagnosis not present

## 2018-02-26 DIAGNOSIS — Z6841 Body Mass Index (BMI) 40.0 and over, adult: Secondary | ICD-10-CM | POA: Diagnosis not present

## 2018-02-26 NOTE — Telephone Encounter (Signed)
Pt returned phone call, pt contact # [phone]

## 2018-02-26 NOTE — Telephone Encounter (Addendum)
ATC patient >> voicemail said Jana Half (?) LMOM TCB x1  Per the 7.3.19 phone note, pt was to pick up a spacer from the [facility] office - our next date there is this coming Thursday

## 2018-02-26 NOTE — Telephone Encounter (Signed)
attempted to call pt but no answer.  Left message for pt to return call x1.

## 2018-02-26 NOTE — Telephone Encounter (Signed)
Called and spoke with pt letting her know we had the spacer in the bag that is taken to [facility] office.  Pt stated she is going to be in [facility] Friday and stated to leave it at the [facility] location and she would come by Friday to pick it up.  Bag for pt has been placed up front for pt.  Nothing further needed.

## 2018-02-26 NOTE — Telephone Encounter (Signed)
Patient returned call [phone].
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
## 2018-03-01 ENCOUNTER — Encounter: Payer: Self-pay | Admitting: Obstetrics and Gynecology

## 2018-03-01 ENCOUNTER — Other Ambulatory Visit: Payer: Self-pay

## 2018-03-01 ENCOUNTER — Ambulatory Visit (INDEPENDENT_AMBULATORY_CARE_PROVIDER_SITE_OTHER): Payer: Medicare Other | Admitting: Obstetrics and Gynecology

## 2018-03-01 ENCOUNTER — Other Ambulatory Visit (HOSPITAL_COMMUNITY)
Admission: RE | Admit: 2018-03-01 | Discharge: 2018-03-01 | Disposition: A | Discharge status: Home / Self Care | Payer: Medicare Other | Source: Ambulatory Visit | Attending: Obstetrics and Gynecology | Admitting: Obstetrics and Gynecology

## 2018-03-01 VITALS — BP 114/62 | HR 68 | Resp 16 | Ht 66.0 in | Wt 274.0 lb

## 2018-03-01 DIAGNOSIS — Z8542 Personal history of malignant neoplasm of other parts of uterus: Secondary | ICD-10-CM | POA: Insufficient documentation

## 2018-03-01 DIAGNOSIS — Z01419 Encounter for gynecological examination (general) (routine) without abnormal findings: Secondary | ICD-10-CM

## 2018-03-01 DIAGNOSIS — R1900 Intra-abdominal and pelvic swelling, mass and lump, unspecified site: Secondary | ICD-10-CM | POA: Diagnosis not present
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
## 2018-03-01 NOTE — Patient Instructions (Signed)

## 2018-03-04 ENCOUNTER — Encounter: Payer: Self-pay | Admitting: Family Medicine

## 2018-03-04 ENCOUNTER — Ambulatory Visit (INDEPENDENT_AMBULATORY_CARE_PROVIDER_SITE_OTHER): Payer: Medicare Other | Admitting: Family Medicine

## 2018-03-04 ENCOUNTER — Telehealth: Payer: Self-pay | Admitting: Obstetrics and Gynecology

## 2018-03-04 VITALS — BP 114/72 | HR 74 | Resp 16 | Ht 66.0 in | Wt 274.0 lb

## 2018-03-04 DIAGNOSIS — E039 Hypothyroidism, unspecified: Secondary | ICD-10-CM

## 2018-03-04 DIAGNOSIS — K760 Fatty (change of) liver, not elsewhere classified: Secondary | ICD-10-CM | POA: Diagnosis not present

## 2018-03-04 DIAGNOSIS — I10 Essential (primary) hypertension: Secondary | ICD-10-CM | POA: Diagnosis not present

## 2018-03-04 DIAGNOSIS — E782 Mixed hyperlipidemia: Secondary | ICD-10-CM

## 2018-03-04 DIAGNOSIS — J42 Unspecified chronic bronchitis: Secondary | ICD-10-CM

## 2018-03-04 DIAGNOSIS — R748 Abnormal levels of other serum enzymes: Secondary | ICD-10-CM | POA: Diagnosis not present

## 2018-03-04 LAB — COMPREHENSIVE METABOLIC PANEL
ALBUMIN: 4.2 g/dL (ref 3.5–5.2)
ALK PHOS: 162 U/L — AB (ref 39–117)
ALT: 37 U/L — ABNORMAL HIGH (ref 0–35)
AST: 33 U/L (ref 0–37)
BILIRUBIN TOTAL: 0.4 mg/dL (ref 0.2–1.2)
BUN: 13 mg/dL (ref 6–23)
CO2: 34 mEq/L — ABNORMAL HIGH (ref 19–32)
Calcium: 9.7 mg/dL (ref 8.4–10.5)
Chloride: 100 mEq/L (ref 96–112)
Creatinine, Ser: 0.82 mg/dL (ref 0.40–1.20)
GFR: 72.83 mL/min (ref >60.00)
GLUCOSE: 96 mg/dL (ref 70–99)
POTASSIUM: 4.7 meq/L (ref 3.5–5.1)
SODIUM: 144 meq/L (ref 135–145)
TOTAL PROTEIN: 7 g/dL (ref 6.0–8.3)

## 2018-03-04 LAB — GAMMA GT: GGT: 81 U/L — ABNORMAL HIGH (ref 7–51)

## 2018-03-04 LAB — CBC
HEMATOCRIT: 38.6 % (ref 36.0–46.0)
HEMOGLOBIN: 12.9 g/dL (ref 12.0–15.0)
MCHC: 33.5 g/dL (ref 30.0–36.0)
MCV: 94.6 fl (ref 78.0–100.0)
Platelets: 281 10*3/uL (ref 150.0–400.0)
RBC: 4.08 Mil/uL (ref 3.87–5.11)
RDW: 13.8 % (ref 11.5–15.5)
WBC: 7.3 10*3/uL (ref 4.0–10.5)

## 2018-03-04 NOTE — Telephone Encounter (Signed)
Call placed to patient to review benefits for a recommended ultrasound. Left voicemail message requesting a return call. °

## 2018-03-04 NOTE — Patient Instructions (Signed)
Great to see you as always today I will set up an [place] of your liver to look at your level of fatty infiltration and we will check your liver function today as well Will request your last colonoscopy report from Dr. Colon Branch for you  Let's plan to visit in 6 months unless any changes

## 2018-03-05 LAB — CYTOLOGY - PAP: Diagnosis: NEGATIVE

## 2018-03-05 NOTE — Telephone Encounter (Signed)
Spoke with patient regarding benefit for recommended ultrasound. Patient understood and agreeable. Patient ready to schedule. Patient scheduled 03/14/18 with Dr Quincy Simmonds. Patient aware of appointment date, arrival time and cancellation policy. No further questions.   Routing to Dr Quincy Simmonds for final review. Patient is agreeable to disposition. Will close encounter

## 2018-03-06 ENCOUNTER — Ambulatory Visit: Payer: Medicare Other | Attending: Family Medicine | Admitting: Occupational Therapy

## 2018-03-06 ENCOUNTER — Encounter: Payer: Self-pay | Admitting: Occupational Therapy

## 2018-03-06 DIAGNOSIS — M25641 Stiffness of right hand, not elsewhere classified: Secondary | ICD-10-CM | POA: Diagnosis not present

## 2018-03-06 DIAGNOSIS — M6281 Muscle weakness (generalized): Secondary | ICD-10-CM | POA: Insufficient documentation

## 2018-03-06 DIAGNOSIS — M79641 Pain in right hand: Secondary | ICD-10-CM | POA: Diagnosis not present
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
## 2018-03-06 NOTE — Patient Instructions (Addendum)
Avoid repetitive power grip with right hand,  wear trigger finger splint on ring finger during waking hours-remove if any problems Use ice pack 2x day for 8-10 mins to right hand MP Flexion (Active Isolated)   Bend __ring and small finger, wear buddy strap____ finger at large knuckle, keeping other fingers straight. Do not bend tips. Repeat _10-15___ times. Do __4-6__ sessions per day.        Copyright  VHI. All rights reserved.

## 2018-03-11 ENCOUNTER — Ambulatory Visit (HOSPITAL_BASED_OUTPATIENT_CLINIC_OR_DEPARTMENT_OTHER)
Admission: RE | Admit: 2018-03-11 | Discharge: 2018-03-11 | Disposition: A | Discharge status: Home / Self Care | Payer: Medicare Other | Source: Ambulatory Visit | Attending: Family Medicine | Admitting: Family Medicine

## 2018-03-11 DIAGNOSIS — K838 Other specified diseases of biliary tract: Secondary | ICD-10-CM | POA: Insufficient documentation

## 2018-03-11 DIAGNOSIS — K760 Fatty (change of) liver, not elsewhere classified: Secondary | ICD-10-CM

## 2018-03-11 DIAGNOSIS — Z9049 Acquired absence of other specified parts of digestive tract: Secondary | ICD-10-CM | POA: Diagnosis not present

## 2018-03-11 DIAGNOSIS — K7689 Other specified diseases of liver: Secondary | ICD-10-CM | POA: Diagnosis not present

## 2018-03-12 ENCOUNTER — Ambulatory Visit: Payer: Medicare Other | Admitting: Occupational Therapy

## 2018-03-12 ENCOUNTER — Encounter: Payer: Self-pay | Admitting: Family Medicine

## 2018-03-12 DIAGNOSIS — M79641 Pain in right hand: Secondary | ICD-10-CM

## 2018-03-12 DIAGNOSIS — M25641 Stiffness of right hand, not elsewhere classified: Secondary | ICD-10-CM

## 2018-03-12 DIAGNOSIS — M6281 Muscle weakness (generalized): Secondary | ICD-10-CM | POA: Diagnosis not present
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
## 2018-03-13 ENCOUNTER — Ambulatory Visit: Payer: Medicare Other | Admitting: Occupational Therapy

## 2018-03-14 ENCOUNTER — Ambulatory Visit (INDEPENDENT_AMBULATORY_CARE_PROVIDER_SITE_OTHER): Payer: Medicare Other

## 2018-03-14 ENCOUNTER — Encounter: Payer: Self-pay | Admitting: Occupational Therapy

## 2018-03-14 ENCOUNTER — Encounter: Payer: Medicare Other | Admitting: Occupational Therapy

## 2018-03-14 ENCOUNTER — Ambulatory Visit (INDEPENDENT_AMBULATORY_CARE_PROVIDER_SITE_OTHER): Payer: Medicare Other | Admitting: Obstetrics and Gynecology

## 2018-03-14 ENCOUNTER — Ambulatory Visit: Payer: Medicare Other | Admitting: Occupational Therapy

## 2018-03-14 ENCOUNTER — Encounter: Payer: Self-pay | Admitting: Obstetrics and Gynecology

## 2018-03-14 VITALS — BP 122/76 | Ht 66.0 in | Wt 276.0 lb

## 2018-03-14 DIAGNOSIS — R1900 Intra-abdominal and pelvic swelling, mass and lump, unspecified site: Secondary | ICD-10-CM | POA: Diagnosis not present

## 2018-03-14 DIAGNOSIS — M25641 Stiffness of right hand, not elsewhere classified: Secondary | ICD-10-CM

## 2018-03-14 DIAGNOSIS — M6281 Muscle weakness (generalized): Secondary | ICD-10-CM

## 2018-03-14 DIAGNOSIS — N949 Unspecified condition associated with female genital organs and menstrual cycle: Secondary | ICD-10-CM | POA: Diagnosis not present

## 2018-03-14 DIAGNOSIS — M79641 Pain in right hand: Secondary | ICD-10-CM
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
## 2018-03-14 NOTE — Patient Instructions (Addendum)
PIP Flexion (Active Blocked)    Hold large knuckle straight using other hand. Bend MIDDLE joint of  Ring and little  finger as far as possible. Hold 3 seconds. Repeat 10 times. Do 2 sessions per day.
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
## 2018-03-14 NOTE — Progress Notes (Signed)
Encounter reviewed by Dr. Brook Amundson C. Silva.  

## 2018-03-18 ENCOUNTER — Ambulatory Visit (INDEPENDENT_AMBULATORY_CARE_PROVIDER_SITE_OTHER)
Admission: RE | Admit: 2018-03-18 | Discharge: 2018-03-18 | Disposition: A | Discharge status: Home / Self Care | Payer: Medicare Other | Source: Ambulatory Visit | Attending: Acute Care | Admitting: Acute Care

## 2018-03-18 ENCOUNTER — Ambulatory Visit (INDEPENDENT_AMBULATORY_CARE_PROVIDER_SITE_OTHER): Payer: Medicare Other | Admitting: Acute Care

## 2018-03-18 ENCOUNTER — Encounter: Payer: Self-pay | Admitting: Acute Care

## 2018-03-18 VITALS — BP 126/72 | HR 89 | Ht 66.0 in | Wt 277.4 lb

## 2018-03-18 DIAGNOSIS — R05 Cough: Secondary | ICD-10-CM | POA: Diagnosis not present

## 2018-03-18 DIAGNOSIS — J42 Unspecified chronic bronchitis: Secondary | ICD-10-CM

## 2018-03-18 MED ORDER — TIOTROPIUM BROMIDE MONOHYDRATE 1.25 MCG/ACT IN AERS: 1.0000 | INHALATION_SPRAY | Freq: Every day | RESPIRATORY_TRACT | 0 refills | Status: DC

## 2018-03-18 NOTE — Patient Instructions (Addendum)
CXR today. We will call you with results We will schedule you for PFT's( Schedule in September in the am) We will schedule you for a 6 minute walk.( Schedule in September early am) Continue your Symbicort. The maintenance dose is 2 puffs twice daily Rinse after use. I know you are titrating your maintenance medication based on your eye pain. We will add Spiriva to your medical regimen. 1 puff once daily Rinse mouth after use. Follow up in 2 months Please contact office for sooner follow up if symptoms do not improve or worsen or seek emergency care

## 2018-03-18 NOTE — Assessment & Plan Note (Addendum)
Continues to complain of worsening shortness of breath Uses Symbicort every other day due to eye pain Plan: CXR today. We will call you with results We will schedule you for PFT's( Schedule in September in the am) We will schedule you for a 6 minute walk.( Schedule in September early am) Continue your Symbicort. The maintenance dose is 2 puffs twice daily Rinse after use. I know you are titrating your maintenance medication based on your eye pain. We will add Spiriva to your medical regimen.( OK'd by eye doctor) 2 puffs once daily Rinse mouth after use. Please let us know if you change your mind about pulmonary rehab Follow up in 2 months Please contact office for sooner follow up if symptoms do not improve or worsen or seek emergency care
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
## 2018-03-19 ENCOUNTER — Encounter: Payer: Medicare Other | Admitting: Occupational Therapy

## 2018-03-19 DIAGNOSIS — H43811 Vitreous degeneration, right eye: Secondary | ICD-10-CM | POA: Diagnosis not present

## 2018-03-19 DIAGNOSIS — H25013 Cortical age-related cataract, bilateral: Secondary | ICD-10-CM | POA: Diagnosis not present

## 2018-03-19 DIAGNOSIS — H35031 Hypertensive retinopathy, right eye: Secondary | ICD-10-CM | POA: Diagnosis not present

## 2018-03-19 DIAGNOSIS — H02889 Meibomian gland dysfunction of unspecified eye, unspecified eyelid: Secondary | ICD-10-CM | POA: Diagnosis not present

## 2018-03-19 DIAGNOSIS — H40013 Open angle with borderline findings, low risk, bilateral: Secondary | ICD-10-CM | POA: Diagnosis not present

## 2018-03-19 DIAGNOSIS — H2513 Age-related nuclear cataract, bilateral: Secondary | ICD-10-CM | POA: Diagnosis not present

## 2018-03-20 ENCOUNTER — Telehealth: Payer: Self-pay | Admitting: Acute Care

## 2018-03-20 ENCOUNTER — Ambulatory Visit: Payer: Medicare Other | Admitting: Occupational Therapy

## 2018-03-20 DIAGNOSIS — M79641 Pain in right hand: Secondary | ICD-10-CM

## 2018-03-20 DIAGNOSIS — M25641 Stiffness of right hand, not elsewhere classified: Secondary | ICD-10-CM

## 2018-03-20 DIAGNOSIS — M6281 Muscle weakness (generalized): Secondary | ICD-10-CM | POA: Diagnosis not present

## 2018-03-20 NOTE — Telephone Encounter (Signed)
Called and spoke with Patient.  Results and recommendations given to Patient.  Understanding stated.  Nothing further at this time.
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
## 2018-03-21 ENCOUNTER — Encounter: Payer: Medicare Other | Admitting: Occupational Therapy

## 2018-03-22 ENCOUNTER — Ambulatory Visit (INDEPENDENT_AMBULATORY_CARE_PROVIDER_SITE_OTHER): Payer: Medicare Other | Admitting: Family Medicine

## 2018-03-22 ENCOUNTER — Encounter: Payer: Self-pay | Admitting: Family Medicine

## 2018-03-22 VITALS — BP 108/54 | HR 59 | Ht 66.0 in | Wt 276.0 lb

## 2018-03-22 DIAGNOSIS — S62346A Nondisplaced fracture of base of fifth metacarpal bone, right hand, initial encounter for closed fracture: Secondary | ICD-10-CM | POA: Diagnosis not present

## 2018-03-22 DIAGNOSIS — M65341 Trigger finger, right ring finger: Secondary | ICD-10-CM | POA: Diagnosis not present

## 2018-03-22 NOTE — Patient Instructions (Addendum)
Thank you for coming in today. Continue to attend hand therapy.  Recheck with me in 6 weeks.  Return sooner if needed.  If not better next step is injection. We dont want the potential future injection to happen right around an eye surgery.   Buddy tape with activity.
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
## 2018-03-26 ENCOUNTER — Ambulatory Visit: Payer: Medicare Other | Attending: Family Medicine | Admitting: Occupational Therapy

## 2018-03-26 DIAGNOSIS — M25641 Stiffness of right hand, not elsewhere classified: Secondary | ICD-10-CM

## 2018-03-26 DIAGNOSIS — M79641 Pain in right hand: Secondary | ICD-10-CM | POA: Insufficient documentation

## 2018-03-26 DIAGNOSIS — M6281 Muscle weakness (generalized): Secondary | ICD-10-CM | POA: Diagnosis not present
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
## 2018-03-28 DIAGNOSIS — N6011 Diffuse cystic mastopathy of right breast: Secondary | ICD-10-CM | POA: Diagnosis not present

## 2018-03-28 DIAGNOSIS — R922 Inconclusive mammogram: Secondary | ICD-10-CM | POA: Diagnosis not present

## 2018-03-29 ENCOUNTER — Ambulatory Visit: Payer: Medicare Other | Admitting: Occupational Therapy

## 2018-03-29 DIAGNOSIS — M6281 Muscle weakness (generalized): Secondary | ICD-10-CM

## 2018-03-29 DIAGNOSIS — M79641 Pain in right hand: Secondary | ICD-10-CM | POA: Diagnosis not present

## 2018-03-29 DIAGNOSIS — M25641 Stiffness of right hand, not elsewhere classified: Secondary | ICD-10-CM
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
## 2018-04-01 ENCOUNTER — Ambulatory Visit: Payer: Medicare Other | Admitting: Occupational Therapy

## 2018-04-01 DIAGNOSIS — M25641 Stiffness of right hand, not elsewhere classified: Secondary | ICD-10-CM | POA: Diagnosis not present

## 2018-04-01 DIAGNOSIS — M79641 Pain in right hand: Secondary | ICD-10-CM | POA: Diagnosis not present

## 2018-04-01 DIAGNOSIS — M6281 Muscle weakness (generalized): Secondary | ICD-10-CM

## 2018-04-01 NOTE — Patient Instructions (Signed)
Wear trigger finger splint at all times except for icing, exercises and bathing. Remove if pressure areas, redness or swelling and bring to next visit.
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
## 2018-04-03 ENCOUNTER — Ambulatory Visit: Payer: Medicare Other | Admitting: Occupational Therapy

## 2018-04-03 DIAGNOSIS — M25641 Stiffness of right hand, not elsewhere classified: Secondary | ICD-10-CM

## 2018-04-03 DIAGNOSIS — M6281 Muscle weakness (generalized): Secondary | ICD-10-CM

## 2018-04-03 DIAGNOSIS — M79641 Pain in right hand: Secondary | ICD-10-CM | POA: Diagnosis not present

## 2018-04-03 NOTE — Patient Instructions (Addendum)
   AROM: PIP Flexion / Extension   Pinch bottom knuckle of ________ finger of hand to prevent bending. Actively bend middle knuckle until stretch is felt. Hold __5__ seconds. Relax. Straighten finger as far as possible. Repeat __10-15__ times per set. Do _4-6___ sessions per day.   AROM: DIP Flexion / Extension    MP Flexion (Active Isolated)  AROM: PIP Flexion / Extension   Pinch bottom knuckle of ___RING AND SMALL_____ finger of hand to prevent bending. Actively bend middle knuckle until stretch is felt. Hold __5__ seconds. Relax. Straighten finger as far as possible. Repeat __10-15__ times per set. Do _4-6___ sessions per day.      Copyright  VHI. All rights reserved.      Opposition (Active)   Touch tip of thumb to nail tip of each finger EXCEPT RING in turn, making an "O" shape. Repeat __10__ times. Do _4-6___ sessions per day.     Spread fingers apart then bring fingers back together, 10 reps 3x day        2. Roll putty into tube on table and pinch between each finger EXCEPT ring finger and thumb x 10 reps each.    Ice hand 2x day for 8 mins  Copyright  VHI. All rights reserved.

## 2018-04-04 ENCOUNTER — Encounter: Payer: Self-pay | Admitting: Obstetrics and Gynecology
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
## 2018-04-10 ENCOUNTER — Encounter: Payer: Medicare Other | Admitting: Occupational Therapy

## 2018-04-12 ENCOUNTER — Encounter: Payer: Medicare Other | Admitting: Occupational Therapy

## 2018-04-12 ENCOUNTER — Telehealth: Payer: Self-pay | Admitting: Family Medicine

## 2018-04-12 DIAGNOSIS — G6289 Other specified polyneuropathies: Secondary | ICD-10-CM

## 2018-04-15 NOTE — Telephone Encounter (Signed)
Requesting:Lyrica Contract:none, needs csc XKP:[id number], needs uds Last Visit:03/04/18 Next Visit:none Last Refill:09/06/17 90 capsules tid 11 refills. Refill too early?   Please Advise

## 2018-04-15 NOTE — Telephone Encounter (Signed)
Can you call [facility] and ask them?  She should have RF remaining still

## 2018-04-16 ENCOUNTER — Encounter: Payer: Medicare Other | Admitting: Occupational Therapy

## 2018-04-16 MED ORDER — PREGABALIN 100 MG PO CAPS: 100.0000 mg | ORAL_CAPSULE | Freq: Three times a day (TID) | ORAL | 5 refills | Status: DC

## 2018-04-16 NOTE — Addendum Note (Signed)
Addended by: Lamar Blinks C on: 04/16/2018 12:30 PM   Modules accepted: Orders

## 2018-04-16 NOTE — Telephone Encounter (Signed)
Called pharmacy, they explained that every 6 months they have to have a new rx sent in. For controlled substances, refills can not be sent in for a year. Patient would like refill today. Please advise.

## 2018-04-17 NOTE — Telephone Encounter (Signed)
Pt called back in to follow up. Pt says although she requested the generic, she would actually like to have the brand name because it is cheaper for her. Pt says that her legs are hurting and has been without her medication for 2 days now, pt would like to know If Rx could be sent in as soon as possible?

## 2018-04-17 NOTE — Telephone Encounter (Signed)
Called the pharmacy- they can dispense generic or brand whichever is cheaper

## 2018-04-18 ENCOUNTER — Encounter: Payer: Medicare Other | Admitting: Occupational Therapy

## 2018-04-23 ENCOUNTER — Ambulatory Visit: Payer: Medicare Other | Attending: Family Medicine | Admitting: Occupational Therapy

## 2018-04-23 DIAGNOSIS — M79641 Pain in right hand: Secondary | ICD-10-CM | POA: Diagnosis not present

## 2018-04-23 DIAGNOSIS — M6281 Muscle weakness (generalized): Secondary | ICD-10-CM | POA: Diagnosis not present

## 2018-04-23 DIAGNOSIS — M25641 Stiffness of right hand, not elsewhere classified: Secondary | ICD-10-CM | POA: Diagnosis not present
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
## 2018-04-24 DIAGNOSIS — H2510 Age-related nuclear cataract, unspecified eye: Secondary | ICD-10-CM | POA: Insufficient documentation

## 2018-04-24 HISTORY — DX: Age-related nuclear cataract, unspecified eye: H25.10

## 2018-04-25 DIAGNOSIS — I10 Essential (primary) hypertension: Secondary | ICD-10-CM | POA: Diagnosis not present

## 2018-04-25 DIAGNOSIS — G629 Polyneuropathy, unspecified: Secondary | ICD-10-CM | POA: Diagnosis not present

## 2018-04-25 DIAGNOSIS — Z882 Allergy status to sulfonamides status: Secondary | ICD-10-CM | POA: Diagnosis not present

## 2018-04-25 DIAGNOSIS — Z886 Allergy status to analgesic agent status: Secondary | ICD-10-CM | POA: Diagnosis not present

## 2018-04-25 DIAGNOSIS — Z79899 Other long term (current) drug therapy: Secondary | ICD-10-CM | POA: Diagnosis not present

## 2018-04-25 DIAGNOSIS — Z881 Allergy status to other antibiotic agents status: Secondary | ICD-10-CM | POA: Diagnosis not present

## 2018-04-25 DIAGNOSIS — J449 Chronic obstructive pulmonary disease, unspecified: Secondary | ICD-10-CM | POA: Diagnosis not present

## 2018-04-25 DIAGNOSIS — Z87891 Personal history of nicotine dependence: Secondary | ICD-10-CM | POA: Diagnosis not present

## 2018-04-25 DIAGNOSIS — E079 Disorder of thyroid, unspecified: Secondary | ICD-10-CM | POA: Diagnosis not present

## 2018-04-25 DIAGNOSIS — Z8542 Personal history of malignant neoplasm of other parts of uterus: Secondary | ICD-10-CM | POA: Diagnosis not present

## 2018-04-25 DIAGNOSIS — H2512 Age-related nuclear cataract, left eye: Secondary | ICD-10-CM | POA: Diagnosis not present

## 2018-04-30 ENCOUNTER — Encounter: Payer: Self-pay | Admitting: Family Medicine

## 2018-04-30 ENCOUNTER — Ambulatory Visit (INDEPENDENT_AMBULATORY_CARE_PROVIDER_SITE_OTHER): Payer: Medicare Other | Admitting: Family Medicine

## 2018-04-30 VITALS — BP 101/49 | HR 65 | Ht 67.0 in | Wt 284.0 lb

## 2018-04-30 DIAGNOSIS — M65341 Trigger finger, right ring finger: Secondary | ICD-10-CM | POA: Diagnosis not present
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
## 2018-04-30 NOTE — Patient Instructions (Addendum)
Thank you for coming in today. Continue the home exercises and treatment and brace.  Let me know how it goes.  Recheck as needed.   Call or go to the ER if you develop a large red swollen joint with extreme pain or oozing puss.
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
## 2018-05-08 ENCOUNTER — Encounter: Payer: Self-pay | Admitting: Family Medicine

## 2018-05-08 ENCOUNTER — Ambulatory Visit (INDEPENDENT_AMBULATORY_CARE_PROVIDER_SITE_OTHER): Payer: Medicare Other | Admitting: Family Medicine

## 2018-05-08 VITALS — BP 138/80 | HR 70 | Temp 97.6°F | Resp 16 | Ht 67.0 in | Wt 283.0 lb

## 2018-05-08 DIAGNOSIS — I10 Essential (primary) hypertension: Secondary | ICD-10-CM

## 2018-05-08 DIAGNOSIS — I2510 Atherosclerotic heart disease of native coronary artery without angina pectoris: Secondary | ICD-10-CM

## 2018-05-08 DIAGNOSIS — Z23 Encounter for immunization: Secondary | ICD-10-CM

## 2018-05-08 DIAGNOSIS — I498 Other specified cardiac arrhythmias: Secondary | ICD-10-CM | POA: Diagnosis not present

## 2018-05-08 DIAGNOSIS — E039 Hypothyroidism, unspecified: Secondary | ICD-10-CM

## 2018-05-08 NOTE — Patient Instructions (Signed)
It was good to see you today!  If you find that EKG I would love to see it Let me know if any symptoms like palpitations or chest pain Otherwise we will do a stress test after the holidays

## 2018-05-09 ENCOUNTER — Telehealth: Payer: Self-pay | Admitting: Family Medicine

## 2018-05-09 NOTE — Telephone Encounter (Signed)
Copied from [place] [id number]. Topic: Quick Communication - See Telephone Encounter >> May 09, 2018 11:31 AM Rosalin Hawking wrote: CRM for notification. See Telephone encounter for: 05/09/18.   Pt dropped off document for provider to see and have on pt's chart (EKG- pt states already mentioned it to provider) Document in a small white envelope. Document put at front office tray under providers name.

## 2018-05-10 ENCOUNTER — Encounter: Payer: Self-pay | Admitting: Family Medicine

## 2018-05-10 ENCOUNTER — Other Ambulatory Visit: Payer: Self-pay | Admitting: Acute Care

## 2018-05-10 DIAGNOSIS — J42 Unspecified chronic bronchitis: Secondary | ICD-10-CM

## 2018-05-10 NOTE — Telephone Encounter (Signed)
Forwarded to provider/SLS 09/20

## 2018-05-13 ENCOUNTER — Ambulatory Visit (INDEPENDENT_AMBULATORY_CARE_PROVIDER_SITE_OTHER): Payer: Medicare Other | Admitting: Pulmonary Disease

## 2018-05-13 DIAGNOSIS — J42 Unspecified chronic bronchitis: Secondary | ICD-10-CM | POA: Diagnosis not present

## 2018-05-13 LAB — PULMONARY FUNCTION TEST
DL/VA % PRED: 100 %
DL/VA: 4.89 ml/min/mmHg/L
DLCO UNC: 20.99 ml/min/mmHg
DLCO unc % pred: 83 %
FEF 25-75 POST: 1.68 L/s
FEF 25-75 Pre: 1.4 L/sec
FEF2575-%Change-Post: 19 %
FEF2575-%PRED-PRE: 75 %
FEF2575-%Pred-Post: 91 %
FEV1-%Change-Post: 4 %
FEV1-%PRED-POST: 81 %
FEV1-%PRED-PRE: 77 %
FEV1-Post: 1.84 L
FEV1-Pre: 1.76 L
FEV1FVC-%Change-Post: 0 %
FEV1FVC-%Pred-Pre: 102 %
FEV6-%CHANGE-POST: 5 %
FEV6-%PRED-POST: 84 %
FEV6-%PRED-PRE: 80 %
FEV6-POST: 2.42 L
FEV6-Pre: 2.3 L
FEV6FVC-%PRED-POST: 105 %
FEV6FVC-%Pred-Pre: 105 %
FVC-%Change-Post: 5 %
FVC-%Pred-Post: 80 %
FVC-%Pred-Pre: 76 %
FVC-POST: 2.42 L
FVC-Pre: 2.3 L
POST FEV6/FVC RATIO: 100 %
PRE FEV1/FVC RATIO: 77 %
Post FEV1/FVC ratio: 76 %
Pre FEV6/FVC Ratio: 100 %
RV % pred: 96 %
RV: 2.19 L
TLC % PRED: 93 %
TLC: 4.81 L

## 2018-05-13 NOTE — Progress Notes (Signed)
PFT completed today. 05/13/18  

## 2018-05-15 ENCOUNTER — Other Ambulatory Visit: Payer: Self-pay | Admitting: Family Medicine

## 2018-05-15 DIAGNOSIS — G6289 Other specified polyneuropathies: Secondary | ICD-10-CM

## 2018-05-15 DIAGNOSIS — I890 Lymphedema, not elsewhere classified: Secondary | ICD-10-CM

## 2018-05-16 MED ORDER — TRAMADOL HCL 50 MG PO TABS
ORAL_TABLET | ORAL | 1 refills | Status: DC
Start: 2018-05-16 — End: 2018-08-07

## 2018-05-16 NOTE — Telephone Encounter (Signed)
Requesting:Tramadol Contract:none, needs csc NET:UYWS, needs uds Last Visit:05/08/18 Next Visit:none scheduled Last Refill:02/12/18 1 refill  Please Advise

## 2018-05-16 NOTE — Telephone Encounter (Signed)
04/17/2018  1  04/16/2018  Lyrica 100 Mg Capsule  90.00 30 Je Cop  [id number]  Wal ([id number])  0/5 2.01 LME Comm Ins  [place]  03/31/2018  1  02/12/2018  Tramadol Hcl 50 Mg Tablet  90.00 30 Je Cop  [id number]  Wal ([id number])  1/1 15.00 MME Comm Ins  [place]  02/20/2018  1  09/06/2017  Lyrica 100 Mg Capsule  90.00 30 Je Cop  [id number]  Wal ([id number])  4/5 2.01 LME Comm Ins  [place]  02/12/2018  1  02/12/2018  Tramadol Hcl 50 Mg Tablet  90.00 30 Je Cop  [id number]  Wal ([id number])  0/1 15.00 MME Comm Ins  [place]  01/11/2018  1  09/06/2017  Lyrica 100 Mg Capsule  90.00 30 Je Cop  [id number]  Wal ([id number])  3/5 2.01 LME Comm Ins  [place]  12/25/2017  1  12/25/2017  Tramadol Hcl 50 Mg Tablet  90.00 30 Je Cop  [id number]  Wal ([id number])  0/0 15.00 MME Comm Ins  [place]  12/17/2017  3  12/17/2017  Hydrocodone-Acetamin 5-325 Mg  15.00 5 Je Cop  [id number]  Med ([id number])  0/0 15.00 MME Medicare  [place]  11/28/2017  1  09/06/2017  Lyrica 100 Mg Capsule  90.00 30 Je Cop  [id number]  Wal ([id number])  2/5 2.01 LME Comm Ins    11/12/2017  1  10/01/2017  Tramadol Hcl 50 Mg Tablet  90.00 30 Je Cop  [id number]  Wal ([id number])  1/1      She is also taking venlafaxine from dr. Erling Cruz- has used both tramadol and this for a long time with no ill effects.  Did mention serotonin syndrome to her so she can watch for any sign of this

## 2018-05-22 DIAGNOSIS — Z8639 Personal history of other endocrine, nutritional and metabolic disease: Secondary | ICD-10-CM | POA: Diagnosis not present

## 2018-05-22 DIAGNOSIS — E559 Vitamin D deficiency, unspecified: Secondary | ICD-10-CM | POA: Diagnosis not present

## 2018-05-22 DIAGNOSIS — E039 Hypothyroidism, unspecified: Secondary | ICD-10-CM | POA: Diagnosis not present

## 2018-05-23 ENCOUNTER — Ambulatory Visit: Payer: Medicare Other

## 2018-05-23 ENCOUNTER — Ambulatory Visit: Payer: Medicare Other | Admitting: Acute Care

## 2018-05-23 DIAGNOSIS — H35031 Hypertensive retinopathy, right eye: Secondary | ICD-10-CM | POA: Diagnosis not present

## 2018-05-23 DIAGNOSIS — H0288B Meibomian gland dysfunction left eye, upper and lower eyelids: Secondary | ICD-10-CM | POA: Diagnosis not present

## 2018-05-23 DIAGNOSIS — K219 Gastro-esophageal reflux disease without esophagitis: Secondary | ICD-10-CM | POA: Diagnosis not present

## 2018-05-23 DIAGNOSIS — I10 Essential (primary) hypertension: Secondary | ICD-10-CM | POA: Diagnosis not present

## 2018-05-23 DIAGNOSIS — H0288A Meibomian gland dysfunction right eye, upper and lower eyelids: Secondary | ICD-10-CM | POA: Diagnosis not present

## 2018-05-23 DIAGNOSIS — H16223 Keratoconjunctivitis sicca, not specified as Sjogren's, bilateral: Secondary | ICD-10-CM | POA: Diagnosis not present

## 2018-05-23 DIAGNOSIS — H2511 Age-related nuclear cataract, right eye: Secondary | ICD-10-CM | POA: Diagnosis not present

## 2018-05-23 DIAGNOSIS — J449 Chronic obstructive pulmonary disease, unspecified: Secondary | ICD-10-CM | POA: Diagnosis not present

## 2018-05-23 DIAGNOSIS — Z9842 Cataract extraction status, left eye: Secondary | ICD-10-CM | POA: Diagnosis not present

## 2018-05-23 DIAGNOSIS — Z961 Presence of intraocular lens: Secondary | ICD-10-CM | POA: Diagnosis not present

## 2018-05-23 DIAGNOSIS — G629 Polyneuropathy, unspecified: Secondary | ICD-10-CM | POA: Diagnosis not present

## 2018-05-23 DIAGNOSIS — H40013 Open angle with borderline findings, low risk, bilateral: Secondary | ICD-10-CM | POA: Diagnosis not present

## 2018-05-24 DIAGNOSIS — Z9841 Cataract extraction status, right eye: Secondary | ICD-10-CM | POA: Insufficient documentation

## 2018-05-24 DIAGNOSIS — Z961 Presence of intraocular lens: Secondary | ICD-10-CM | POA: Insufficient documentation

## 2018-05-29 DIAGNOSIS — R5382 Chronic fatigue, unspecified: Secondary | ICD-10-CM | POA: Diagnosis not present

## 2018-05-29 DIAGNOSIS — E559 Vitamin D deficiency, unspecified: Secondary | ICD-10-CM | POA: Diagnosis not present

## 2018-05-29 DIAGNOSIS — Z8639 Personal history of other endocrine, nutritional and metabolic disease: Secondary | ICD-10-CM | POA: Diagnosis not present

## 2018-05-29 DIAGNOSIS — Z6841 Body Mass Index (BMI) 40.0 and over, adult: Secondary | ICD-10-CM | POA: Diagnosis not present

## 2018-05-29 DIAGNOSIS — E039 Hypothyroidism, unspecified: Secondary | ICD-10-CM | POA: Diagnosis not present

## 2018-06-04 NOTE — Progress Notes (Signed)
No show

## 2018-06-05 ENCOUNTER — Inpatient Hospital Stay: Payer: Medicare Other

## 2018-06-05 ENCOUNTER — Telehealth: Payer: Self-pay | Admitting: Oncology

## 2018-06-05 ENCOUNTER — Inpatient Hospital Stay: Payer: Medicare Other | Admitting: Oncology

## 2018-06-05 NOTE — Telephone Encounter (Signed)
Returned pts call to r/s appts   °

## 2018-06-06 ENCOUNTER — Encounter: Payer: Self-pay | Admitting: Oncology

## 2018-06-06 ENCOUNTER — Ambulatory Visit: Payer: Medicare Other | Admitting: Acute Care

## 2018-06-06 ENCOUNTER — Ambulatory Visit: Payer: Medicare Other

## 2018-06-10 ENCOUNTER — Ambulatory Visit (INDEPENDENT_AMBULATORY_CARE_PROVIDER_SITE_OTHER): Payer: Medicare Other | Admitting: *Deleted

## 2018-06-10 ENCOUNTER — Ambulatory Visit (INDEPENDENT_AMBULATORY_CARE_PROVIDER_SITE_OTHER): Payer: Medicare Other | Admitting: Acute Care

## 2018-06-10 ENCOUNTER — Encounter: Payer: Self-pay | Admitting: Acute Care

## 2018-06-10 DIAGNOSIS — R0600 Dyspnea, unspecified: Secondary | ICD-10-CM

## 2018-06-10 DIAGNOSIS — R0609 Other forms of dyspnea: Secondary | ICD-10-CM

## 2018-06-10 DIAGNOSIS — R5381 Other malaise: Secondary | ICD-10-CM | POA: Diagnosis not present

## 2018-06-10 DIAGNOSIS — I2510 Atherosclerotic heart disease of native coronary artery without angina pectoris: Secondary | ICD-10-CM

## 2018-06-10 DIAGNOSIS — Z6841 Body Mass Index (BMI) 40.0 and over, adult: Secondary | ICD-10-CM | POA: Diagnosis not present

## 2018-06-10 NOTE — Patient Instructions (Addendum)
It is good to see you today Continue your Symbicort and Spiriva as you have been doing Rinse mouth after use. We will send in a prescription for Spiriva. Start walking on a track to see if we can improve your conditioning Let us know if you would like referral to dietitian Consider the Whole 30 diet as an option for weight loss. I think exercise along with diet will help. We will give you the ILD questionnaire to complete. Mail it back to [place] when you are finished with it. Stress test as is scheduled Follow up in 3 months  We will determine schedule for follow up PFT's and 6 minute walks. Please contact office for sooner follow up if symptoms do not improve or worsen or seek emergency care .
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
## 2018-06-10 NOTE — Assessment & Plan Note (Signed)
Continued Dyspnea with exertion  Better with addition of Symbicort and Spiriva Plan Continue your Symbicort and Spiriva as you have been doing Rinse mouth after use. We will send in a prescription for Spiriva. Start walking on a track to see if we can improve your conditioning Let us know if you would like referral to dietitian Consider the Whole 30 diet as an option for weight loss. I think exercise along with diet will help. We will give you the ILD questionnaire to complete. Mail it back to [place] when you are finished with it. Stress test as is scheduled Follow up in 3 months  We will determine schedule for follow up PFT's and 6 minute walks. Please contact office for sooner follow up if symptoms do not improve or worsen or seek emergency care .

## 2018-06-10 NOTE — Assessment & Plan Note (Signed)
Offered pulmonary rehab but declined. Felt it did not help her dyspnea much Plan: Try to walk daily around an outside track while the weather is cooler/ [facility] Exercise along with diet is the best way to take weight off and improve conditioning.

## 2018-06-10 NOTE — Progress Notes (Signed)
Reviewed, agree 

## 2018-06-10 NOTE — Assessment & Plan Note (Signed)
Let us know if you would like referral to dietitian Consider the Whole 30 diet as an option for weight loss. Consider waking on an outside track now that the weather is cooler. I think exercise along with diet will help. Follow up in 3 months  Please contact office for sooner follow up if symptoms do not improve or worsen or seek emergency care .

## 2018-06-10 NOTE — Progress Notes (Signed)
SIX MIN WALK 06/10/2018 01/26/2015 02/24/2014  Medications Lipito 20mg , symbicort 80,cozaar 50mg , lyrica 100mg , Thyroid 120mg  and 30mg , spiriva 1.25, Tramadol 50 and Eddexor 50mg  all taken at 5:00a - -  Supplimental Oxygen during Test? (L/min) No No No  Laps 7 - -  Partial Lap (in Meters) 0 - -  Baseline BP (sitting) 118/70 - -  Baseline Heartrate 76 - -  Baseline Dyspnea (Borg Scale) 1 - -  Baseline Fatigue (Borg Scale) 2 - -  Baseline SPO2 94 - -  BP (sitting) 140/80 - -  Heartrate 100 - -  Dyspnea (Borg Scale) 4 - -  Fatigue (Borg Scale) 5 - -  SPO2 90 - -  BP (sitting) 130/76 - -  Heartrate 81 - -  SPO2 95 - -  Stopped or Paused before Six Minutes Yes - -  Other Symptoms at end of Exercise pt completed test at moderate pace with c/o rt knee pain and sob. Test paused at 192 meters due to right knee pain and sob SPO2 95% HR 90 at time of pause. steady gait. - -  Interpretation Leg pain - -  Distance Completed 336 - -  Tech Comments: - pt walked a moderately fast pace, did not complete laps 2 and 3 d/t sob.   Pt completed walk with little to know diff//[facility]

## 2018-07-02 ENCOUNTER — Telehealth (HOSPITAL_COMMUNITY): Payer: Self-pay | Admitting: *Deleted

## 2018-07-02 DIAGNOSIS — F330 Major depressive disorder, recurrent, mild: Secondary | ICD-10-CM | POA: Diagnosis not present

## 2018-07-02 DIAGNOSIS — F341 Dysthymic disorder: Secondary | ICD-10-CM | POA: Diagnosis not present

## 2018-07-02 DIAGNOSIS — F451 Undifferentiated somatoform disorder: Secondary | ICD-10-CM | POA: Diagnosis not present

## 2018-07-02 NOTE — Telephone Encounter (Signed)
Patient given detailed instructions per Myocardial Perfusion Study Information Sheet for the test on 07/08/18 at 1230. Patient notified to arrive 15 minutes early and that it is imperative to arrive on time for appointment to keep from having the test rescheduled.  If you need to cancel or reschedule your appointment, please call the office within 24 hours of your appointment. . Patient verbalized understanding.Shelly Mosley, Ranae Palms

## 2018-07-08 ENCOUNTER — Ambulatory Visit (HOSPITAL_COMMUNITY): Payer: Medicare Other | Attending: Cardiology

## 2018-07-08 DIAGNOSIS — I2510 Atherosclerotic heart disease of native coronary artery without angina pectoris: Secondary | ICD-10-CM | POA: Diagnosis not present

## 2018-07-08 DIAGNOSIS — I498 Other specified cardiac arrhythmias: Secondary | ICD-10-CM | POA: Insufficient documentation

## 2018-07-08 MED ORDER — TECHNETIUM TC 99M TETROFOSMIN IV KIT
33.0000 | PACK | Freq: Once | INTRAVENOUS | Status: AC | PRN
  Administered 2018-07-08: 33 via INTRAVENOUS
  Filled 2018-07-08: qty 33

## 2018-07-08 MED ORDER — REGADENOSON 0.4 MG/5ML IV SOLN
0.4000 mg | Freq: Once | INTRAVENOUS | Status: AC
  Administered 2018-07-08: 0.4 mg via INTRAVENOUS

## 2018-07-09 ENCOUNTER — Ambulatory Visit (HOSPITAL_COMMUNITY): Payer: Medicare Other | Attending: Cardiovascular Disease

## 2018-07-09 ENCOUNTER — Telehealth: Payer: Self-pay | Admitting: Family Medicine

## 2018-07-09 LAB — MYOCARDIAL PERFUSION IMAGING
CHL CUP NUCLEAR SDS: 7
CHL CUP NUCLEAR SRS: 1
CSEPPHR: 86 {beats}/min
LV sys vol: 22 mL
LVDIAVOL: 80 mL (ref 46–106)
Rest HR: 67 {beats}/min
SSS: 8
TID: 1.1

## 2018-07-09 MED ORDER — TECHNETIUM TC 99M TETROFOSMIN IV KIT
31.9000 | PACK | Freq: Once | INTRAVENOUS | Status: AC | PRN
  Administered 2018-07-09: 31.9 via INTRAVENOUS
  Filled 2018-07-09: qty 32

## 2018-07-09 NOTE — Telephone Encounter (Signed)
Received her lexiscan- it is normal Called her to discuss- however number listed seems to be for "Jana Half" who is listed on her contacts, but is not the patient.  Will mail her a copy of her report        Nuclear stress EF: 72%. The left ventricular ejection fraction is hyperdynamic (>65%).  There was no ST segment deviation noted during stress.  The study is normal.  This is a low risk study. There is no evidence of ischemia and no evidence of previous infarction.

## 2018-07-16 DIAGNOSIS — H35342 Macular cyst, hole, or pseudohole, left eye: Secondary | ICD-10-CM | POA: Diagnosis not present

## 2018-07-16 DIAGNOSIS — H43823 Vitreomacular adhesion, bilateral: Secondary | ICD-10-CM | POA: Diagnosis not present

## 2018-07-16 DIAGNOSIS — H43813 Vitreous degeneration, bilateral: Secondary | ICD-10-CM | POA: Diagnosis not present

## 2018-07-27 IMAGING — DX DG HAND COMPLETE 3+V*R*
3 series · 3 of 3 positions shown · non-contrast
Comparison: Plain films of the RIGHT hand dated 01/11/2018,
01/04/2018 and 12/12/2017.

CLINICAL DATA: Follow-up metacarpal bone fracture.

EXAM:
RIGHT HAND - COMPLETE 3+ VIEW

[hand pa]
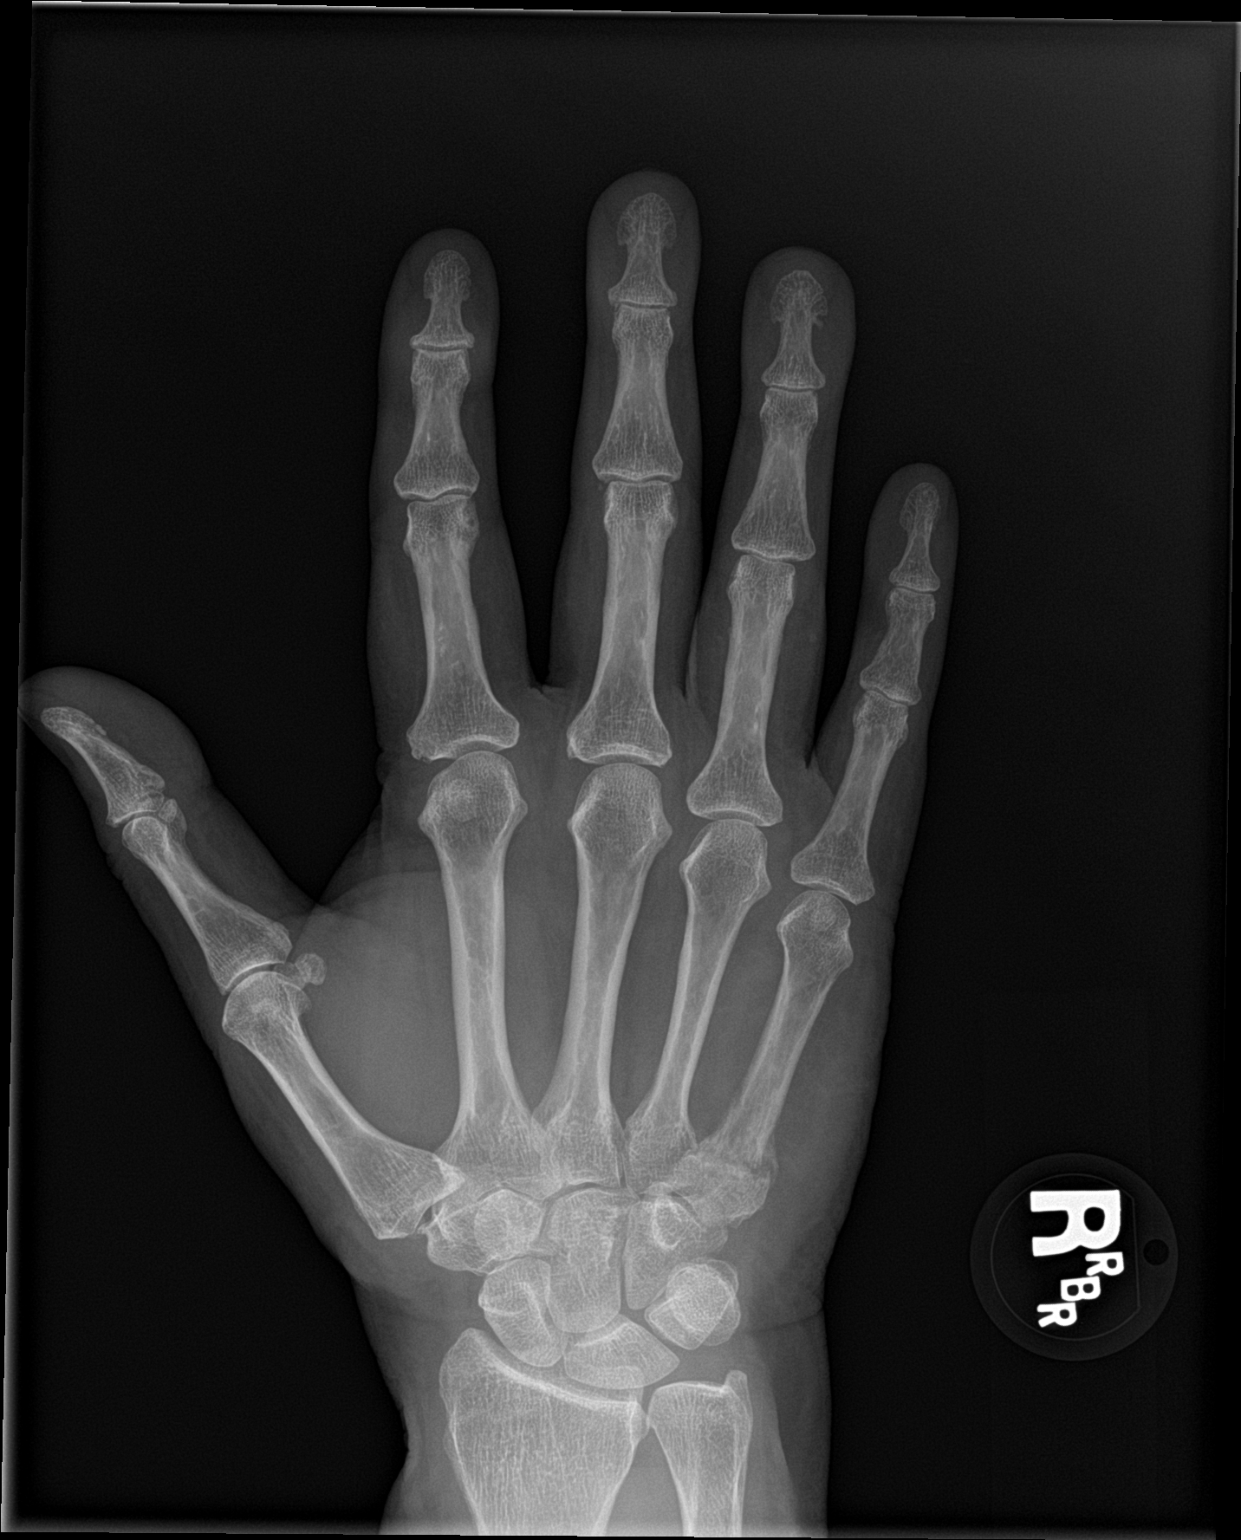

[hand obl]
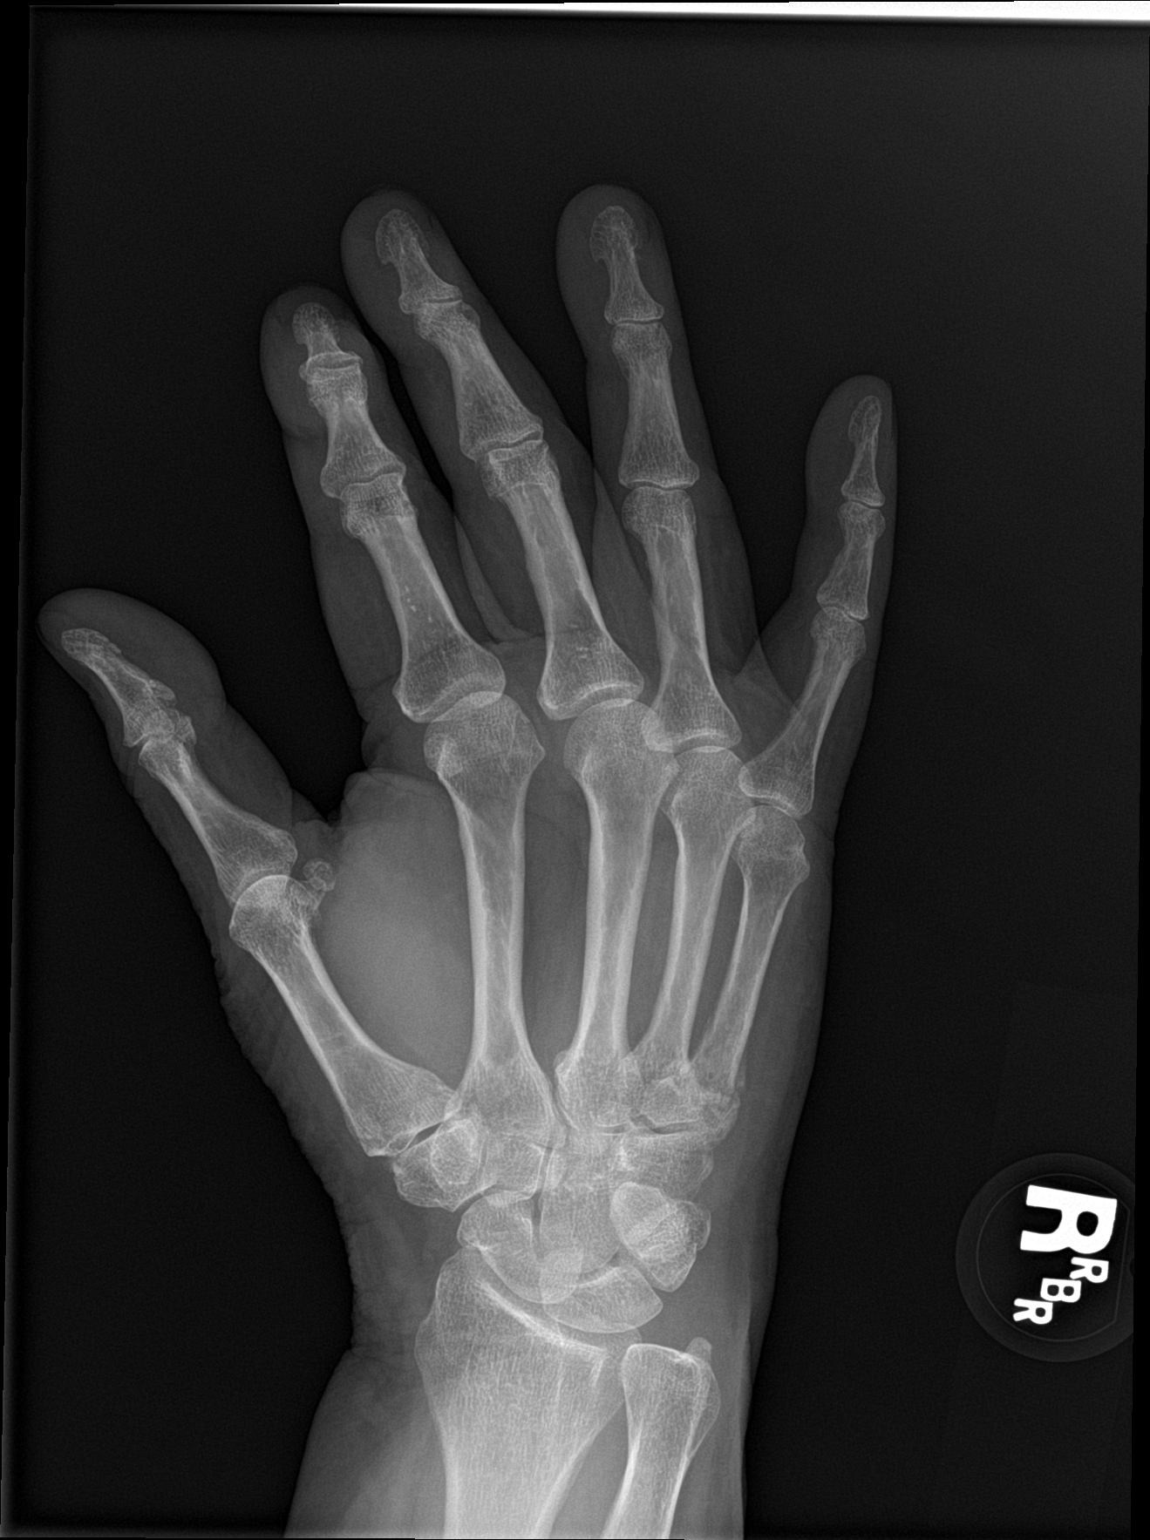

[hand lat]
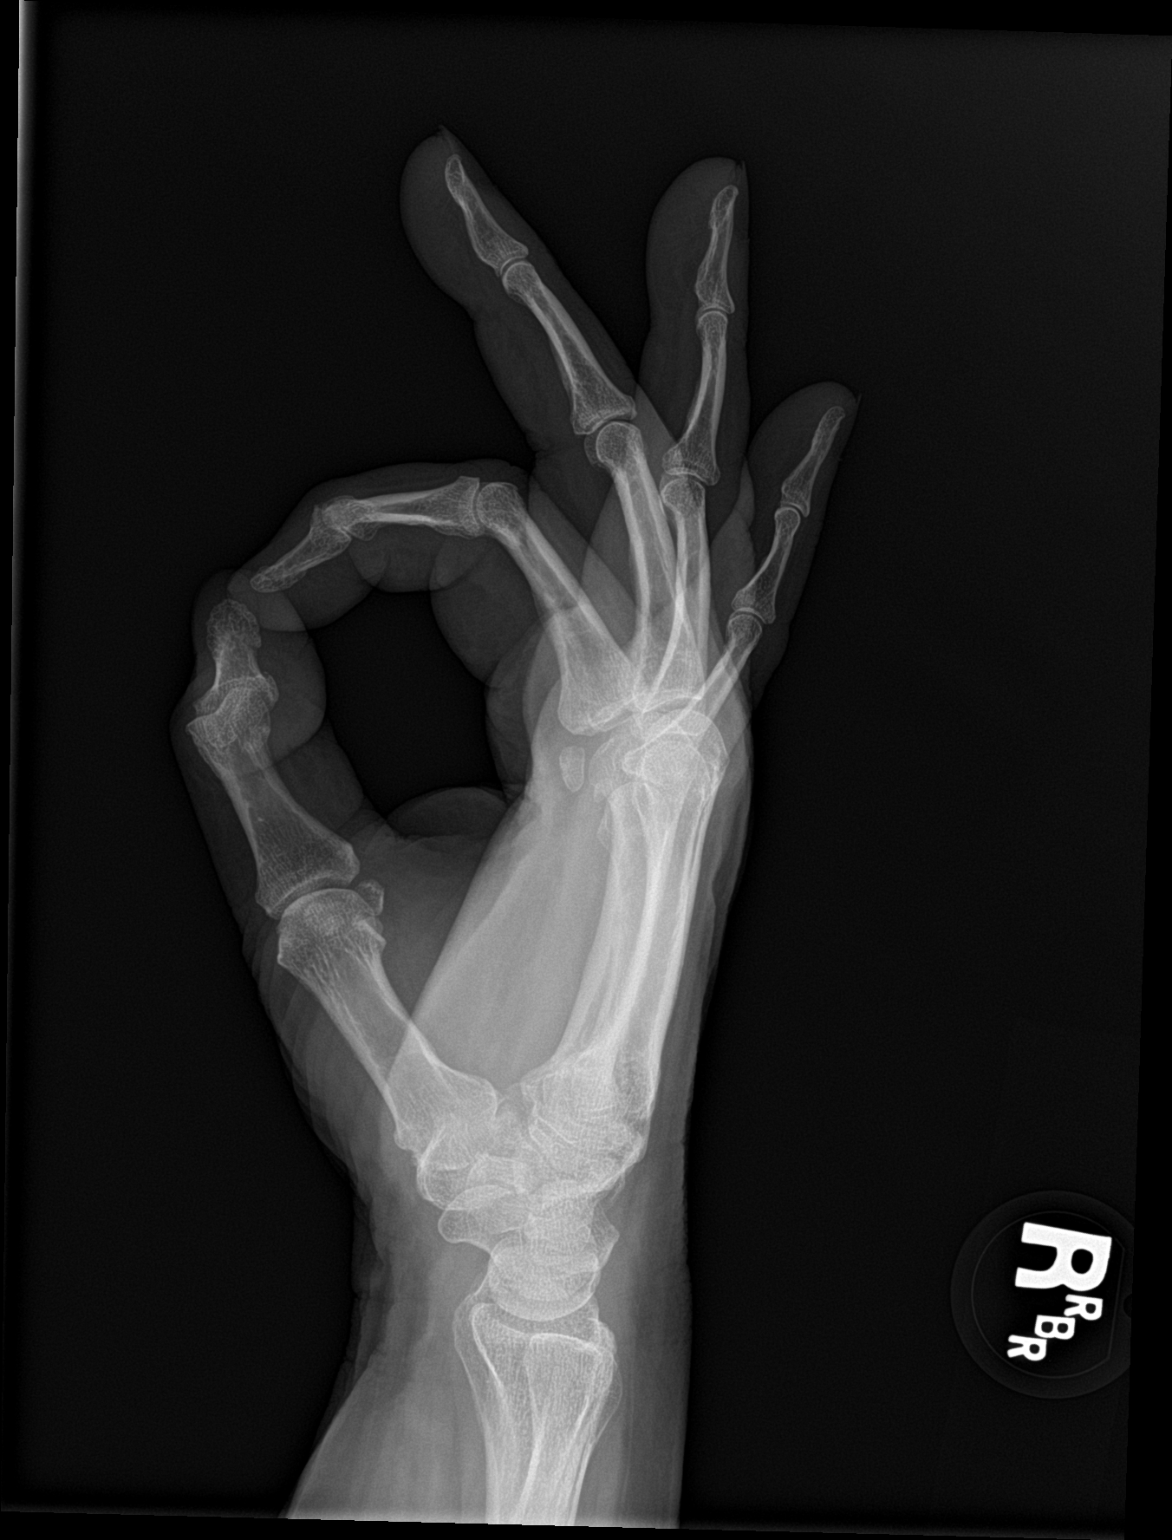

[3 of 3 positions shown; findings below may reference images not displayed]

FINDINGS: Again noted is the fracture at the base of the RIGHT fifth
metacarpal bone. Early callus formation is again seen at the
fracture site, perhaps minimally increased in density compared to
the plain film of 01/11/2018, confidently increased in density
compared to the earlier study of 01/04/2018. Alignment at the
fracture site is stable.
IMPRESSION: Healing fracture at the base of the RIGHT fifth metacarpal bone.
Fracture line remains evident. Stable alignment.
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
## 2018-08-05 ENCOUNTER — Inpatient Hospital Stay: Payer: Medicare Other | Attending: Oncology

## 2018-08-05 ENCOUNTER — Telehealth: Payer: Self-pay | Admitting: Oncology

## 2018-08-05 ENCOUNTER — Inpatient Hospital Stay (HOSPITAL_BASED_OUTPATIENT_CLINIC_OR_DEPARTMENT_OTHER): Payer: Medicare Other | Admitting: Oncology

## 2018-08-05 VITALS — BP 115/96 | HR 66 | Temp 98.1°F | Resp 18 | Ht 67.0 in | Wt 292.0 lb

## 2018-08-05 DIAGNOSIS — Z803 Family history of malignant neoplasm of breast: Secondary | ICD-10-CM | POA: Diagnosis not present

## 2018-08-05 DIAGNOSIS — Z90710 Acquired absence of both cervix and uterus: Secondary | ICD-10-CM | POA: Diagnosis not present

## 2018-08-05 DIAGNOSIS — Z9189 Other specified personal risk factors, not elsewhere classified: Secondary | ICD-10-CM

## 2018-08-05 DIAGNOSIS — Z8542 Personal history of malignant neoplasm of other parts of uterus: Secondary | ICD-10-CM

## 2018-08-05 DIAGNOSIS — C541 Malignant neoplasm of endometrium: Secondary | ICD-10-CM

## 2018-08-05 DIAGNOSIS — Z9079 Acquired absence of other genital organ(s): Secondary | ICD-10-CM

## 2018-08-05 DIAGNOSIS — Z1501 Genetic susceptibility to malignant neoplasm of breast: Secondary | ICD-10-CM | POA: Insufficient documentation

## 2018-08-05 DIAGNOSIS — Z1239 Encounter for other screening for malignant neoplasm of breast: Secondary | ICD-10-CM

## 2018-08-05 DIAGNOSIS — Z79899 Other long term (current) drug therapy: Secondary | ICD-10-CM | POA: Insufficient documentation

## 2018-08-05 DIAGNOSIS — Z90722 Acquired absence of ovaries, bilateral: Secondary | ICD-10-CM | POA: Insufficient documentation

## 2018-08-05 DIAGNOSIS — Z87891 Personal history of nicotine dependence: Secondary | ICD-10-CM | POA: Insufficient documentation

## 2018-08-05 LAB — COMPREHENSIVE METABOLIC PANEL
ALT: 67 U/L — ABNORMAL HIGH (ref 0–44)
AST: 56 U/L — AB (ref 15–41)
Albumin: 3.7 g/dL (ref 3.5–5.0)
Alkaline Phosphatase: 186 U/L — ABNORMAL HIGH (ref 38–126)
Anion gap: 13 (ref 5–15)
BUN: 9 mg/dL (ref 8–23)
CHLORIDE: 102 mmol/L (ref 98–111)
CO2: 30 mmol/L (ref 22–32)
Calcium: 10.1 mg/dL (ref 8.9–10.3)
Creatinine, Ser: 0.82 mg/dL (ref 0.44–1.00)
GFR calc Af Amer: >60 mL/min (ref >60)
Glucose, Bld: 92 mg/dL (ref 70–99)
Potassium: 4.6 mmol/L (ref 3.5–5.1)
SODIUM: 145 mmol/L (ref 135–145)
Total Bilirubin: 0.4 mg/dL (ref 0.3–1.2)
Total Protein: 7.6 g/dL (ref 6.5–8.1)

## 2018-08-05 LAB — CBC WITH DIFFERENTIAL/PLATELET
ABS IMMATURE GRANULOCYTES: 0.04 10*3/uL (ref 0.00–0.07)
BASOS ABS: 0 10*3/uL (ref 0.0–0.1)
Basophils Relative: 0 %
EOS ABS: 0.1 10*3/uL (ref 0.0–0.5)
Eosinophils Relative: 2 %
HCT: 40.3 % (ref 36.0–46.0)
Hemoglobin: 12.6 g/dL (ref 12.0–15.0)
IMMATURE GRANULOCYTES: 1 %
Lymphocytes Relative: 25 %
Lymphs Abs: 1.6 10*3/uL (ref 0.7–4.0)
MCH: 30.1 pg (ref 26.0–34.0)
MCHC: 31.3 g/dL (ref 30.0–36.0)
MCV: 96.4 fL (ref 80.0–100.0)
Monocytes Absolute: 0.4 10*3/uL (ref 0.1–1.0)
Monocytes Relative: 6 %
NEUTROS ABS: 4.2 10*3/uL (ref 1.7–7.7)
NEUTROS PCT: 66 %
NRBC: 0 % (ref 0.0–0.2)
PLATELETS: 253 10*3/uL (ref 150–400)
RBC: 4.18 MIL/uL (ref 3.87–5.11)
RDW: 13.4 % (ref 11.5–15.5)
WBC: 6.3 10*3/uL (ref 4.0–10.5)

## 2018-08-05 NOTE — Telephone Encounter (Signed)
Gave avs and calendar ° °

## 2018-08-06 ENCOUNTER — Other Ambulatory Visit: Payer: Self-pay | Admitting: Family Medicine

## 2018-08-06 DIAGNOSIS — G6289 Other specified polyneuropathies: Secondary | ICD-10-CM

## 2018-08-07 NOTE — Telephone Encounter (Signed)
Last visit in September  NCCSSR:  07/09/2018  1   04/16/2018  Lyrica 100 Mg Capsule  75.00 30 Je Cop  [id number]  [facility] ([id number])  2/5 1.68 LME Comm Ins  [place]  06/26/2018  1   05/16/2018  Tramadol Hcl 50 Mg Tablet  90.00 30 Je Cop  [id number]  [facility] (9255)  1/1 15.00 MME Comm Ins  [place]  05/31/2018  1   04/16/2018  Lyrica 100 Mg Capsule  90.00 30 Je Cop  [id number]  [facility] ([id number])  1/5 2.01 LME Comm Ins  [place]  05/16/2018  1   05/16/2018  Tramadol Hcl 50 Mg Tablet  90.00 30 Je Cop  [id number]  [facility] ([id number])  0/1 15.00 MME Comm Ins  [place]  04/17/2018  1   04/16/2018  Lyrica 100 Mg Capsule  90.00 30 Je Cop  [id number]  [facility] (9255)  0/5 2.01 LME Comm Ins  [place]  03/31/2018  1   02/12/2018  Tramadol Hcl 50 Mg Tablet  90.00 30 Je Cop  [id number]  [facility] (9255)  1/1 15.00 MME Comm Ins  [place]  02/20/2018  1   09/06/2017  Lyrica 100 Mg Capsule  90.00 30 Je Cop  [id number]  [facility] (9255)  4/5 2.01 LME Comm Ins  [place]  02/12/2018  1   02/12/2018  Tramadol Hcl 50 Mg Tablet  90.00 30 Je Cop  [id number]  [facility] (9255)  0/1 15.00 MME Comm Ins  [place]  01/11/2018  1   09/06/2017  Lyrica 100 Mg Capsule  90.00 30 Je Cop  [id number]  [facility] (2111)  3/5      Ok to refill

## 2018-08-09 ENCOUNTER — Other Ambulatory Visit: Payer: Self-pay | Admitting: Oncology

## 2018-08-09 DIAGNOSIS — Z1239 Encounter for other screening for malignant neoplasm of breast: Secondary | ICD-10-CM

## 2018-08-16 IMAGING — DX DG CHEST 2V
2 series · 2 of 2 positions shown · non-contrast
Comparison: 12/24/2017.  12/12/2017.

CLINICAL DATA: Cough and congestion.

EXAM:
CHEST - 2 VIEW

[chest pa]
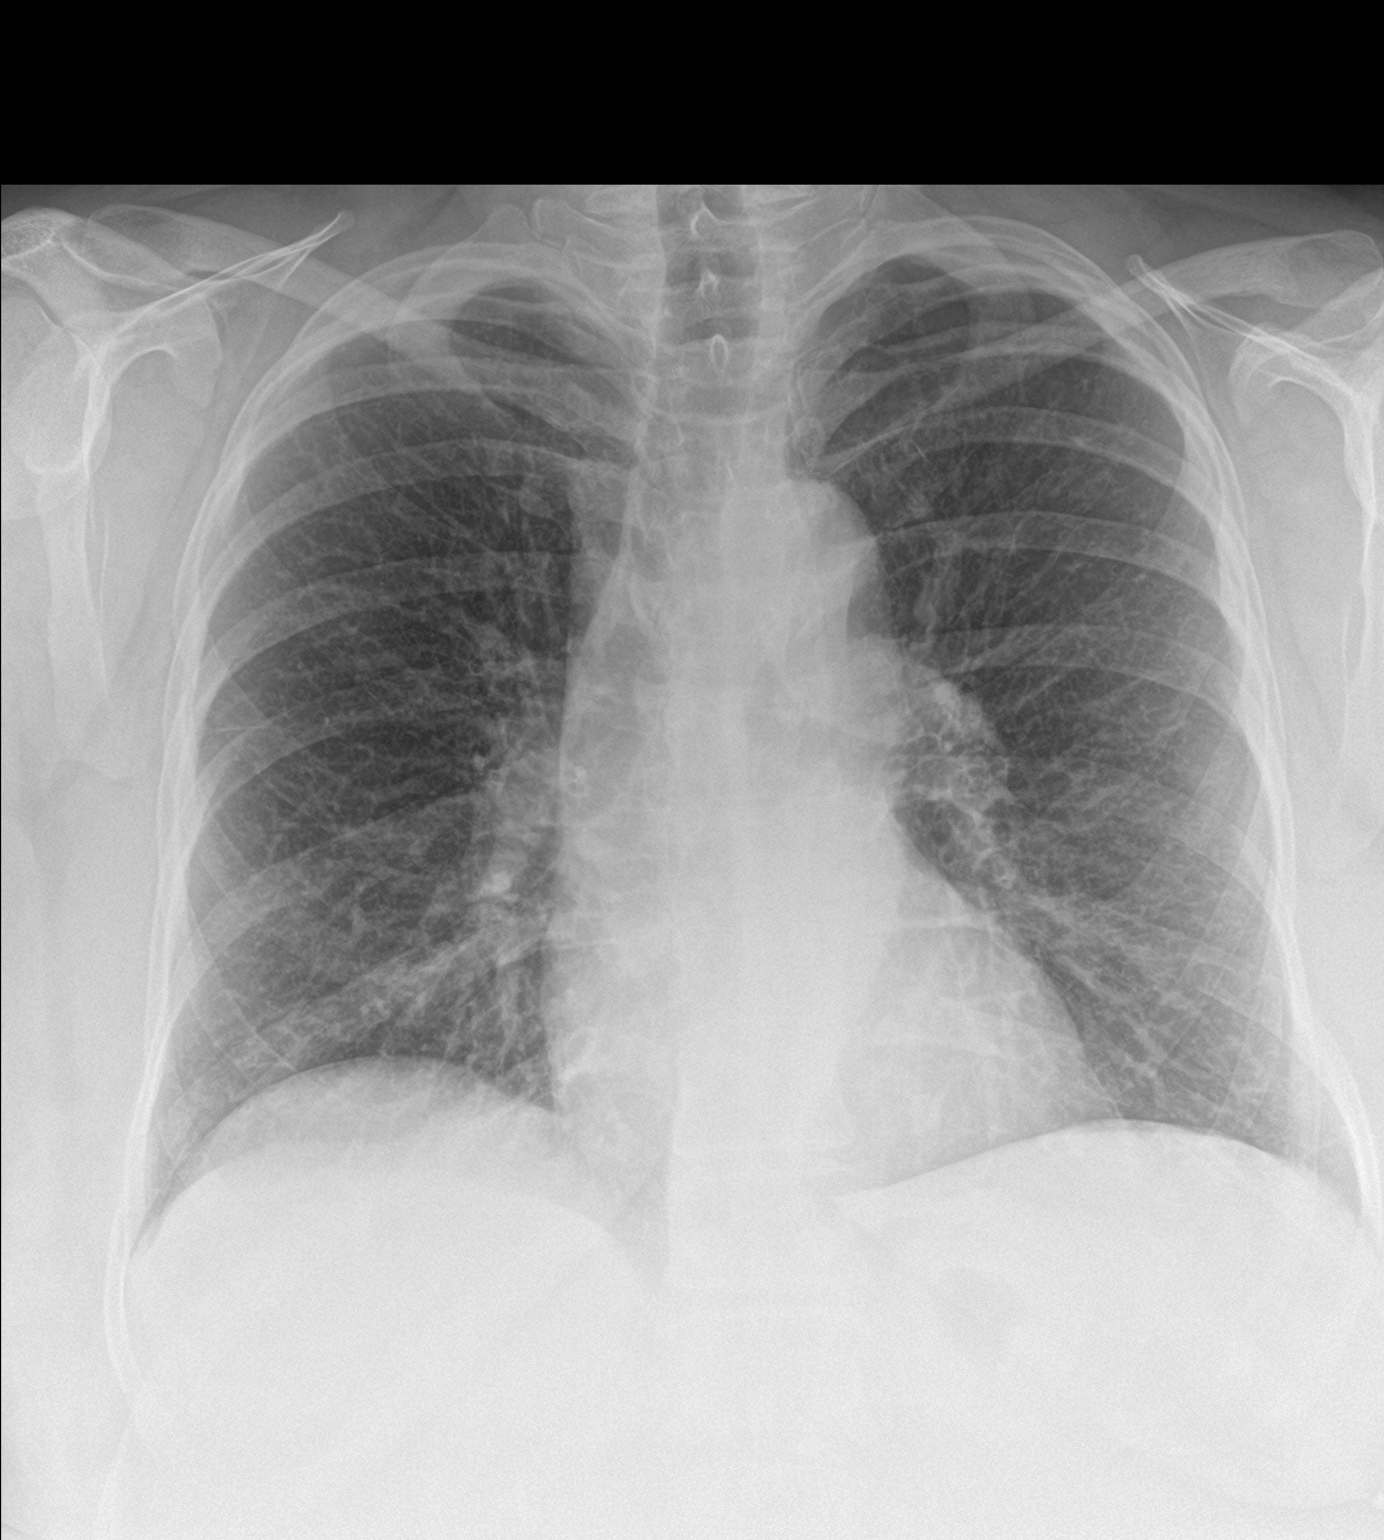

[chest lat]
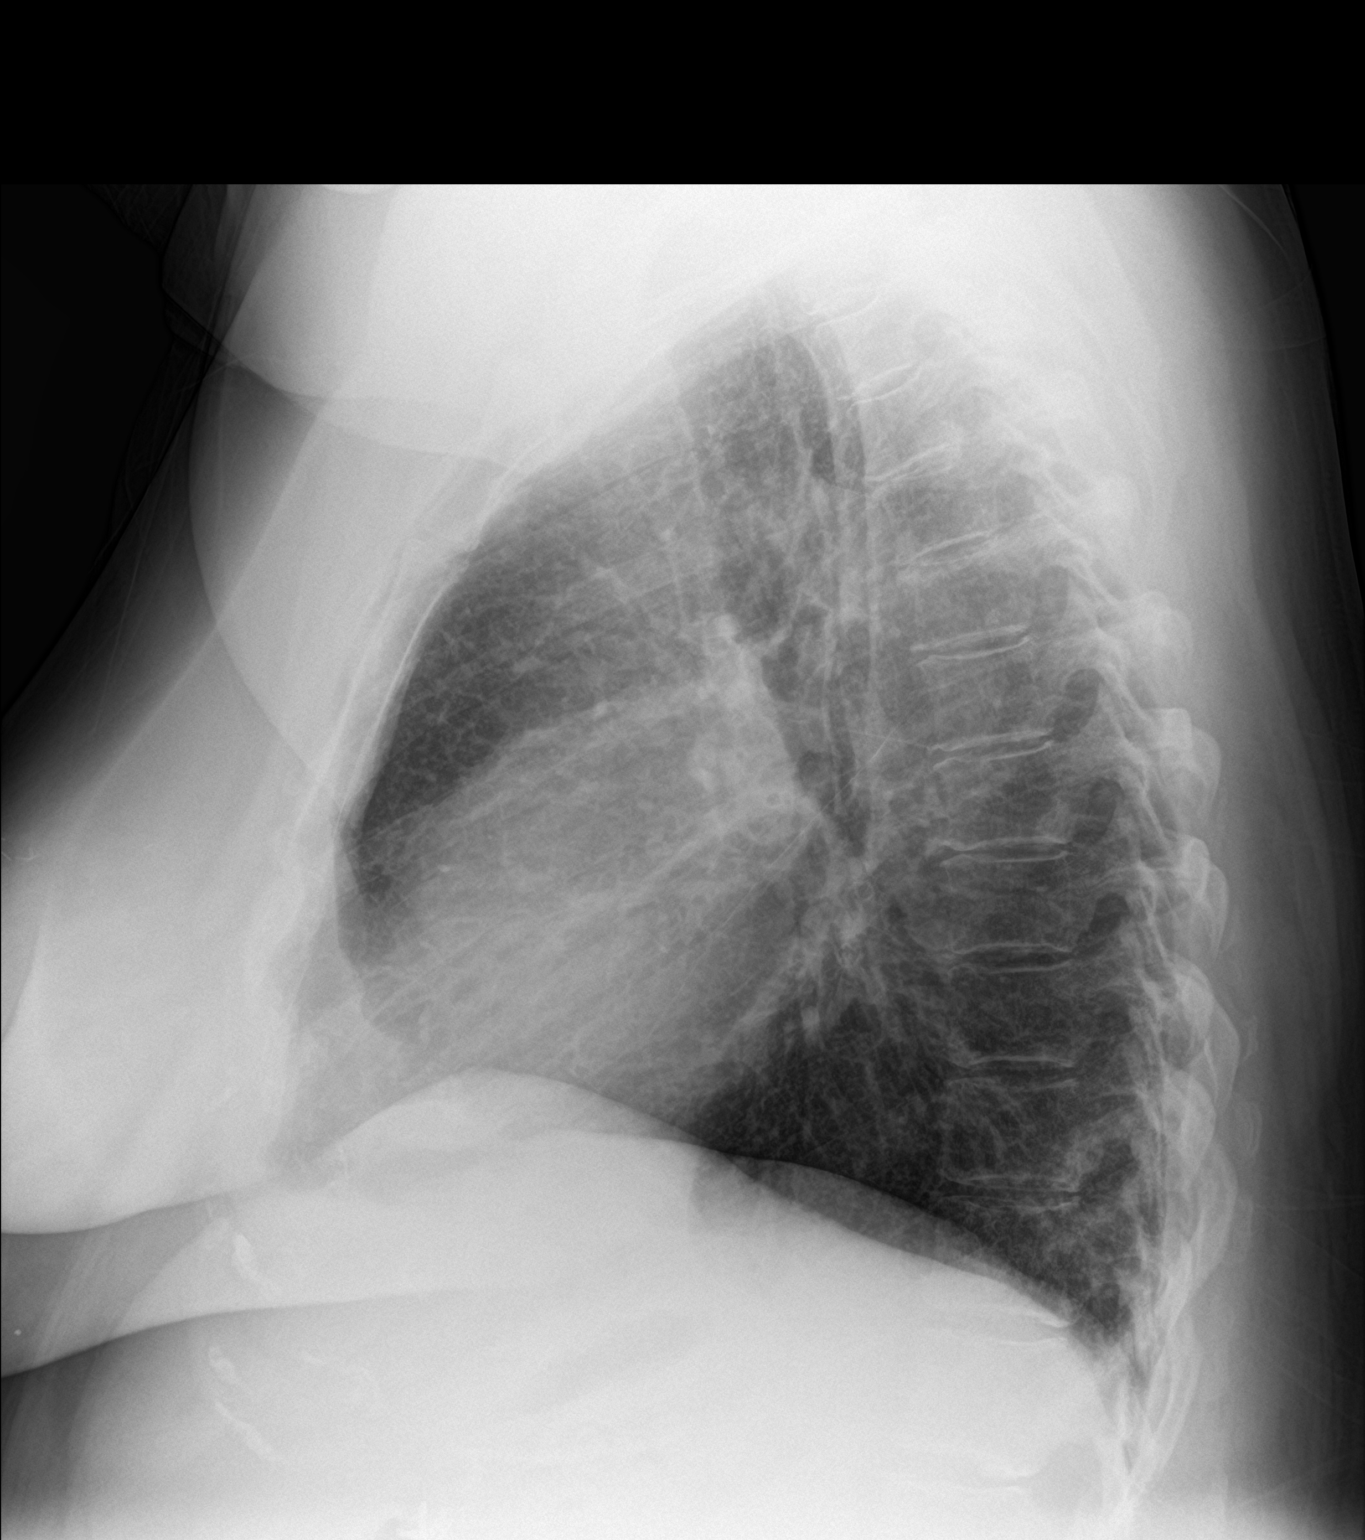

[2 of 2 positions shown; findings below may reference images not displayed]

FINDINGS: Mediastinum and hilar structures normal. Cardiomegaly with normal
pulmonary vascularity. Stable bilateral interstitial prominence.
Chronic interstitial lung disease could present this fashion. No
pleural effusion or pneumothorax. No acute bony abnormality.
IMPRESSION: Unchanged prominence of the pulmonary interstitium suggesting
chronic interstitial lung disease.
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
## 2018-08-22 ENCOUNTER — Ambulatory Visit: Payer: Medicare Other | Admitting: Family Medicine

## 2018-08-22 ENCOUNTER — Telehealth: Payer: Self-pay | Admitting: Family Medicine

## 2018-08-22 DIAGNOSIS — M65341 Trigger finger, right ring finger: Secondary | ICD-10-CM

## 2018-08-22 NOTE — Telephone Encounter (Signed)
Pt called. She wants referral for surgery on her trigger finger. Thanks.

## 2018-08-22 NOTE — Telephone Encounter (Signed)
Referred to hand surgery.

## 2018-08-22 NOTE — Telephone Encounter (Signed)
Pt advised. She would like to stay in the [place] location. Will route to referral coordinator.

## 2018-08-22 NOTE — Telephone Encounter (Signed)
Sent referral to [name] they will schedule with patient for good appointment time - CF

## 2018-08-23 NOTE — Telephone Encounter (Signed)
Thank you :)

## 2018-08-29 DIAGNOSIS — M65331 Trigger finger, right middle finger: Secondary | ICD-10-CM | POA: Diagnosis not present

## 2018-08-29 DIAGNOSIS — M65341 Trigger finger, right ring finger: Secondary | ICD-10-CM | POA: Diagnosis not present

## 2018-09-02 ENCOUNTER — Encounter: Payer: Self-pay | Admitting: Oncology

## 2018-09-02 ENCOUNTER — Ambulatory Visit
Admission: RE | Admit: 2018-09-02 | Discharge: 2018-09-02 | Disposition: A | Discharge status: Home / Self Care | Payer: Medicare Other | Source: Ambulatory Visit | Attending: Oncology | Admitting: Oncology

## 2018-09-02 DIAGNOSIS — N6489 Other specified disorders of breast: Secondary | ICD-10-CM | POA: Diagnosis not present

## 2018-09-02 DIAGNOSIS — Z1239 Encounter for other screening for malignant neoplasm of breast: Secondary | ICD-10-CM

## 2018-09-02 DIAGNOSIS — Z803 Family history of malignant neoplasm of breast: Secondary | ICD-10-CM | POA: Diagnosis not present

## 2018-09-02 MED ORDER — GADOBUTROL 1 MMOL/ML IV SOLN
10.0000 mL | Freq: Once | INTRAVENOUS | Status: AC | PRN
  Administered 2018-09-02: 10 mL via INTRAVENOUS

## 2018-09-03 DIAGNOSIS — M6530 Trigger finger, unspecified finger: Secondary | ICD-10-CM | POA: Insufficient documentation

## 2018-09-12 ENCOUNTER — Ambulatory Visit: Payer: Medicare Other | Admitting: Acute Care

## 2018-09-16 ENCOUNTER — Ambulatory Visit (INDEPENDENT_AMBULATORY_CARE_PROVIDER_SITE_OTHER)
Admission: RE | Admit: 2018-09-16 | Discharge: 2018-09-16 | Disposition: A | Discharge status: Home / Self Care | Payer: Medicare Other | Source: Ambulatory Visit | Attending: Acute Care | Admitting: Acute Care

## 2018-09-16 ENCOUNTER — Encounter: Payer: Self-pay | Admitting: Acute Care

## 2018-09-16 ENCOUNTER — Other Ambulatory Visit: Payer: Self-pay | Admitting: Acute Care

## 2018-09-16 ENCOUNTER — Ambulatory Visit (INDEPENDENT_AMBULATORY_CARE_PROVIDER_SITE_OTHER): Payer: Medicare Other | Admitting: Acute Care

## 2018-09-16 VITALS — BP 122/70 | HR 68 | Temp 97.3°F | Ht 67.0 in | Wt 290.4 lb

## 2018-09-16 DIAGNOSIS — J309 Allergic rhinitis, unspecified: Secondary | ICD-10-CM | POA: Diagnosis not present

## 2018-09-16 DIAGNOSIS — J449 Chronic obstructive pulmonary disease, unspecified: Secondary | ICD-10-CM | POA: Diagnosis not present

## 2018-09-16 DIAGNOSIS — R918 Other nonspecific abnormal finding of lung field: Secondary | ICD-10-CM | POA: Diagnosis not present

## 2018-09-16 DIAGNOSIS — J42 Unspecified chronic bronchitis: Secondary | ICD-10-CM | POA: Diagnosis not present

## 2018-09-16 DIAGNOSIS — K219 Gastro-esophageal reflux disease without esophagitis: Secondary | ICD-10-CM | POA: Diagnosis not present

## 2018-09-16 DIAGNOSIS — R059 Cough, unspecified: Secondary | ICD-10-CM

## 2018-09-16 DIAGNOSIS — R05 Cough: Secondary | ICD-10-CM

## 2018-09-16 DIAGNOSIS — G4733 Obstructive sleep apnea (adult) (pediatric): Secondary | ICD-10-CM

## 2018-09-16 LAB — POCT INFLUENZA A/B
Influenza A, POC: NEGATIVE
Influenza B, POC: NEGATIVE

## 2018-09-16 MED ORDER — METHYLPREDNISOLONE ACETATE 80 MG/ML IJ SUSP
80.0000 mg | Freq: Once | INTRAMUSCULAR | Status: AC
  Administered 2018-09-16: 80 mg via INTRAMUSCULAR

## 2018-09-16 MED ORDER — HYDROCOD POLST-CPM POLST ER 10-8 MG/5ML PO SUER: 5.0000 mL | Freq: Every evening | ORAL | 0 refills | Status: DC | PRN

## 2018-09-16 MED ORDER — LEVALBUTEROL HCL 0.63 MG/3ML IN NEBU
0.6300 mg | INHALATION_SOLUTION | Freq: Once | RESPIRATORY_TRACT | Status: AC
  Administered 2018-09-16: 0.63 mg via RESPIRATORY_TRACT

## 2018-09-16 MED ORDER — PREDNISONE 10 MG PO TABS: ORAL_TABLET | ORAL | 0 refills | Status: DC

## 2018-09-16 MED ORDER — AZITHROMYCIN 250 MG PO TABS: ORAL_TABLET | ORAL | 0 refills | Status: DC

## 2018-09-16 NOTE — Assessment & Plan Note (Signed)
Follow up HRCT 02/2019 to evaluate for progression

## 2018-09-16 NOTE — Assessment & Plan Note (Signed)
  Allergic rhinitis contributing to flare Plan Try Allegra in place of Zyrtec to see if you have any better results with your post nasal drip. Continue netty pot

## 2018-09-16 NOTE — Patient Instructions (Addendum)
It is good to see you today. CXR today We will call you with result. We will give you a Depo Medrol injection today. Start Prednisone taper 1/28/am.; 10 mg tablets: 4 tabs x 2 days, 3 tabs x 2 days, 2 tabs x 2 days 1 tab x 2 days then stop. We will prescribe a z pack Take probiotic with antibiotic We will give you a xopenex neb treatment now. Continue Symbicort and Spiriva Rinse mouth after use Continue using rescue inhaler as needed for shortness of breath or wheezing. Please re-start omeprazole and pepcid  Try Allegra in place of Zyrtec to see if you have any better results with your post nasal drip. Continue netty pot Continue to work on diet and exercise. Sips of water instead of throat clearing Sugar Free [employer] or [employer] originals for throat soothing. Delsym Cough syrup 5 cc's every 12 hours Tussionex cough syrup 5 cc's at bedtime. This will make you sleepy Do not drive if sleepy We will schedule you for a home sleep study to check for obstructive sleep apnea. HRCT 02/2019 with follow up appointment after  with Judson Roch NP or Dr. Lake Bells Please contact office for sooner follow up if symptoms do not improve or worsen or seek emergency care
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
## 2018-09-16 NOTE — Assessment & Plan Note (Signed)
  Flare Flu swab negative CXR with o acute process CXR today We will call you with result. We will give you a Depo Medrol injection today. Start Prednisone taper 1/28/am.; 10 mg tablets: 4 tabs x 2 days, 3 tabs x 2 days, 2 tabs x 2 days 1 tab x 2 days then stop. We will prescribe a z pack ( 9 allergies, cannot use Doxy) Take probiotic with antibiotic We will give you a xopenex neb treatment now. Continue Symbicort and Spiriva Rinse mouth after use Continue using rescue inhaler as needed for shortness of breath or wheezing. Please re-start omeprazole and pepcid  Sips of water instead of throat clearing Sugar Free [employer] or [employer] originals for throat soothing. Delsym Cough syrup 5 cc's every 12 hours Tussionex cough syrup 5 cc's at bedtime.>>( per patient has taken this before recently  with no allergy  issues, no mouth swelling) This will make you sleepy Do not drive if sleepy We will schedule you for a home sleep study to check for obstructive sleep apnea. HRCT 02/2019 with follow up appointment after  with Judson Roch NP or Dr. Lake Bells Please contact office for sooner follow up if symptoms do not improve or worsen or seek emergency care

## 2018-09-16 NOTE — Assessment & Plan Note (Signed)
Has stopped her omeprazole and pepci as she feels she does not hav GERD any more Cough Plan Resume your pepcid and omeprazole

## 2018-09-17 ENCOUNTER — Telehealth: Payer: Self-pay | Admitting: Acute Care

## 2018-09-17 ENCOUNTER — Other Ambulatory Visit: Payer: Self-pay | Admitting: Family Medicine

## 2018-09-17 DIAGNOSIS — I10 Essential (primary) hypertension: Secondary | ICD-10-CM

## 2018-09-17 NOTE — Progress Notes (Signed)
Reviewed, agree 

## 2018-09-17 NOTE — Telephone Encounter (Signed)
Notes recorded by Riley Kill, CMA on 09/17/2018 at 11:29 AM EST Called patient, unable to reach left message to give [place] a call back. ------  Notes recorded by Jannette Spanner, CMA on 09/16/2018 at 5:40 PM EST ATC pt, no answer. Left message for pt to call back.  ------  Notes recorded by Magdalen Spatz, NP on 09/16/2018 at 5:40 PM EST Please let patient know CXR did not show and acute changes or infection. Thanks so much  Pt is aware is results and no further questions or concerns at this time.

## 2018-09-18 DIAGNOSIS — H35342 Macular cyst, hole, or pseudohole, left eye: Secondary | ICD-10-CM | POA: Diagnosis not present

## 2018-09-18 DIAGNOSIS — H3581 Retinal edema: Secondary | ICD-10-CM | POA: Diagnosis not present

## 2018-09-18 DIAGNOSIS — H43822 Vitreomacular adhesion, left eye: Secondary | ICD-10-CM | POA: Diagnosis not present

## 2018-09-18 DIAGNOSIS — H43813 Vitreous degeneration, bilateral: Secondary | ICD-10-CM | POA: Diagnosis not present

## 2018-09-23 ENCOUNTER — Telehealth: Payer: Self-pay

## 2018-09-23 DIAGNOSIS — Z0279 Encounter for issue of other medical certificate: Secondary | ICD-10-CM

## 2018-09-23 NOTE — Telephone Encounter (Signed)
PA initiated via Covermymeds; KEY: [id number]. PA approved.   CaseId:[id number];Status:Approved;Review Type:Qty;Coverage Start Date:08/24/2018;Coverage End Date:09/23/2019

## 2018-10-02 ENCOUNTER — Other Ambulatory Visit: Payer: Self-pay | Admitting: Family Medicine

## 2018-10-08 DIAGNOSIS — G4733 Obstructive sleep apnea (adult) (pediatric): Secondary | ICD-10-CM | POA: Diagnosis not present

## 2018-10-09 DIAGNOSIS — G4733 Obstructive sleep apnea (adult) (pediatric): Secondary | ICD-10-CM | POA: Diagnosis not present

## 2018-10-25 DIAGNOSIS — M65341 Trigger finger, right ring finger: Secondary | ICD-10-CM | POA: Diagnosis not present

## 2018-11-04 ENCOUNTER — Ambulatory Visit: Payer: Medicare Other | Admitting: Family Medicine

## 2018-11-17 ENCOUNTER — Other Ambulatory Visit: Payer: Self-pay | Admitting: Family Medicine

## 2018-11-17 DIAGNOSIS — I890 Lymphedema, not elsewhere classified: Secondary | ICD-10-CM

## 2018-11-28 ENCOUNTER — Telehealth: Payer: Self-pay | Admitting: Pulmonary Disease

## 2018-11-28 NOTE — Telephone Encounter (Signed)
Called and spoke with pt. Pt stated she received a bill in the mail for additional paperwork that was to be filled out by our office and stated she thought SG was going to be filling out a handicap placard application for her. The bill pt said she received had been sent to her a little more than a month ago but pt stated she has not seen any application for a handicap placard.   Sarah, please advise on this for pt if you know anything about a handicap placard application that you were to be filling out for pt. Thanks!

## 2018-11-29 NOTE — Telephone Encounter (Signed)
I have no idea what this is about. Please send to Kathlee Nations

## 2018-12-02 NOTE — Telephone Encounter (Signed)
The handicap placard paperwork has been signed by me and is being completed by Jonelle Sidle my nurse. There is a section of the application that the patient is responsible for completing.  I feel sure that the paperwork the patient was charged for had nothing to do with the handicapped plaquard  but something else that was done for her by another provider/.

## 2018-12-02 NOTE — Telephone Encounter (Signed)
ATC patient, call could not be completed. Pt does not have MyChart; therefore, I could not send her a message that way. Will try to call back at a later time.

## 2018-12-02 NOTE — Telephone Encounter (Signed)
Yes, I will let pt know that I will mail it to her.

## 2018-12-02 NOTE — Telephone Encounter (Signed)
Called pt now that our phones are back up. Pt states when she saw Eric Form, NP on 09/16/2018, pt requested a handicap placement card and that Judson Roch stated she would look into it. Pt states she then received a bill from [facility] for $25 charging for "additional paperwork" and wondered if it had to do with the handicap placement paperwork that she never received. Informed pt that we will look into the bill via our coding specialist and will have Eric Form look into the handicap placement paperwork.   SG, please advise. Pt states she cannot walk 100 ft (roughly from a close proximity parking place to the entrance of a grocery store) without resting.

## 2018-12-02 NOTE — Telephone Encounter (Signed)
Shelly Sidle do you still have this form? Thanks

## 2018-12-04 NOTE — Telephone Encounter (Signed)
Sent placard form in the mail to pt.

## 2018-12-09 ENCOUNTER — Telehealth: Payer: Self-pay

## 2018-12-09 NOTE — Telephone Encounter (Signed)
Handicap plaquard was handed to SG to sign and complete today.  SG please advise once completed. Thank you.

## 2018-12-09 NOTE — Telephone Encounter (Signed)
-----   Message from Magdalen Spatz, NP sent at 12/02/2018  3:46 PM EDT ----- Regarding: Handicap plaquard Here is the patient who needs the handicap plaquard. Kathlee Nations is looking into what she was charged for. Thanks

## 2018-12-09 NOTE — Telephone Encounter (Signed)
I am positive another one of these was completed last week by Jonelle Sidle, which I signed. Is there a reason we are doing this again?

## 2018-12-10 NOTE — Telephone Encounter (Signed)
I mailed the form to her address so I am not sure what happened.

## 2018-12-24 ENCOUNTER — Other Ambulatory Visit: Payer: Self-pay | Admitting: Family Medicine

## 2018-12-24 DIAGNOSIS — G6289 Other specified polyneuropathies: Secondary | ICD-10-CM

## 2018-12-25 ENCOUNTER — Other Ambulatory Visit: Payer: Self-pay

## 2018-12-29 ENCOUNTER — Other Ambulatory Visit: Payer: Self-pay | Admitting: Family Medicine

## 2018-12-29 DIAGNOSIS — G6289 Other specified polyneuropathies: Secondary | ICD-10-CM

## 2018-12-30 ENCOUNTER — Other Ambulatory Visit: Payer: Self-pay | Admitting: Family Medicine

## 2018-12-30 NOTE — Telephone Encounter (Signed)
Due for visit

## 2018-12-30 NOTE — Telephone Encounter (Signed)
Copied from [facility] [phone]. Topic: Quick Communication - Rx Refill/Question >> Dec 30, 2018  7:50 AM Rayann Heman wrote: Medication: traMADol (ULTRAM) 50 MG tablet [[id number]]    Has the patient contacted their pharmacy? no Preferred Pharmacy (with phone number or street name): [id number] - [place], [place] - [address] AT [place] [phone] (Phone) [phone] (Fax)    Agent: Please be advised that RX refills may take up to 3 business days. We ask that you follow-up with your pharmacy.

## 2018-12-30 NOTE — Telephone Encounter (Signed)
Requesting:Ultram Contract:none, needs csc SRP:[id number], needs uds Last Visit:09/06/2017 Next Visit:none, due for ov for further refills Last Refill:08/07/2018 2 refills  Please Advise

## 2018-12-30 NOTE — Telephone Encounter (Signed)
Hi Bailey, I did refill her tramadol, but can you please get her scheduled for a virtual visit for medication check.  Just because she is on a controlled substance

## 2018-12-31 NOTE — Telephone Encounter (Signed)
Patient scheduled for telephone visit 01/01/2019
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
## 2018-12-31 NOTE — Patient Instructions (Signed)
It was great to talk with you today I would recommend that you get the shingles vaccine, called Shingrix, at your drugstore

## 2019-01-01 ENCOUNTER — Ambulatory Visit (INDEPENDENT_AMBULATORY_CARE_PROVIDER_SITE_OTHER): Payer: Medicare Other | Admitting: Family Medicine

## 2019-01-01 ENCOUNTER — Other Ambulatory Visit: Payer: Self-pay

## 2019-01-01 ENCOUNTER — Encounter: Payer: Self-pay | Admitting: Family Medicine

## 2019-01-01 DIAGNOSIS — E039 Hypothyroidism, unspecified: Secondary | ICD-10-CM

## 2019-01-01 DIAGNOSIS — Z131 Encounter for screening for diabetes mellitus: Secondary | ICD-10-CM | POA: Diagnosis not present

## 2019-01-01 DIAGNOSIS — E782 Mixed hyperlipidemia: Secondary | ICD-10-CM

## 2019-01-01 DIAGNOSIS — K760 Fatty (change of) liver, not elsewhere classified: Secondary | ICD-10-CM

## 2019-01-01 DIAGNOSIS — E559 Vitamin D deficiency, unspecified: Secondary | ICD-10-CM | POA: Diagnosis not present

## 2019-01-01 DIAGNOSIS — G6289 Other specified polyneuropathies: Secondary | ICD-10-CM

## 2019-01-01 DIAGNOSIS — E2839 Other primary ovarian failure: Secondary | ICD-10-CM

## 2019-01-01 DIAGNOSIS — Z130 Encounter for screening for diseases of the blood and blood-forming organs and certain disorders involving the immune mechanism: Secondary | ICD-10-CM | POA: Diagnosis not present

## 2019-01-01 DIAGNOSIS — I10 Essential (primary) hypertension: Secondary | ICD-10-CM | POA: Diagnosis not present

## 2019-01-08 ENCOUNTER — Other Ambulatory Visit (INDEPENDENT_AMBULATORY_CARE_PROVIDER_SITE_OTHER): Payer: Medicare Other

## 2019-01-08 ENCOUNTER — Other Ambulatory Visit: Payer: Self-pay

## 2019-01-08 DIAGNOSIS — I10 Essential (primary) hypertension: Secondary | ICD-10-CM | POA: Diagnosis not present

## 2019-01-08 DIAGNOSIS — E559 Vitamin D deficiency, unspecified: Secondary | ICD-10-CM | POA: Diagnosis not present

## 2019-01-08 DIAGNOSIS — E782 Mixed hyperlipidemia: Secondary | ICD-10-CM

## 2019-01-08 DIAGNOSIS — E039 Hypothyroidism, unspecified: Secondary | ICD-10-CM | POA: Diagnosis not present

## 2019-01-08 DIAGNOSIS — Z131 Encounter for screening for diabetes mellitus: Secondary | ICD-10-CM | POA: Diagnosis not present

## 2019-01-08 DIAGNOSIS — K760 Fatty (change of) liver, not elsewhere classified: Secondary | ICD-10-CM | POA: Diagnosis not present

## 2019-01-08 DIAGNOSIS — Z130 Encounter for screening for diseases of the blood and blood-forming organs and certain disorders involving the immune mechanism: Secondary | ICD-10-CM

## 2019-01-08 LAB — COMPREHENSIVE METABOLIC PANEL
ALT: 59 U/L — ABNORMAL HIGH (ref 0–35)
AST: 57 U/L — ABNORMAL HIGH (ref 0–37)
Albumin: 3.9 g/dL (ref 3.5–5.2)
Alkaline Phosphatase: 140 U/L — ABNORMAL HIGH (ref 39–117)
BUN: 13 mg/dL (ref 6–23)
CO2: 31 mEq/L (ref 19–32)
Calcium: 8.9 mg/dL (ref 8.4–10.5)
Chloride: 100 mEq/L (ref 96–112)
Creatinine, Ser: 0.73 mg/dL (ref 0.40–1.20)
GFR: 78.18 mL/min (ref >60.00)
Glucose, Bld: 102 mg/dL — ABNORMAL HIGH (ref 70–99)
Potassium: 3.6 mEq/L (ref 3.5–5.1)
Sodium: 141 mEq/L (ref 135–145)
Total Bilirubin: 0.5 mg/dL (ref 0.2–1.2)
Total Protein: 6.6 g/dL (ref 6.0–8.3)

## 2019-01-08 LAB — CBC
HCT: 39 % (ref 36.0–46.0)
Hemoglobin: 13.1 g/dL (ref 12.0–15.0)
MCHC: 33.6 g/dL (ref 30.0–36.0)
MCV: 94.9 fl (ref 78.0–100.0)
Platelets: 248 10*3/uL (ref 150.0–400.0)
RBC: 4.11 Mil/uL (ref 3.87–5.11)
RDW: 13.6 % (ref 11.5–15.5)
WBC: 5.6 10*3/uL (ref 4.0–10.5)

## 2019-01-08 LAB — LIPID PANEL
Cholesterol: 134 mg/dL (ref 0–200)
HDL: 46.2 mg/dL (ref >39.00)
LDL Cholesterol: 52 mg/dL (ref 0–99)
NonHDL: 88.03
Total CHOL/HDL Ratio: 3
Triglycerides: 181 mg/dL — ABNORMAL HIGH (ref 0.0–149.0)
VLDL: 36.2 mg/dL (ref 0.0–40.0)

## 2019-01-08 LAB — TSH: TSH: 3.4 u[IU]/mL (ref 0.35–4.50)

## 2019-01-08 LAB — VITAMIN D 25 HYDROXY (VIT D DEFICIENCY, FRACTURES): VITD: 28.79 ng/mL — ABNORMAL LOW (ref 30.00–100.00)

## 2019-01-08 NOTE — Addendum Note (Signed)
Addended by: Caffie Pinto on: 01/08/2019 09:40 AM   Modules accepted: Orders

## 2019-01-09 ENCOUNTER — Encounter: Payer: Self-pay | Admitting: Family Medicine

## 2019-01-15 ENCOUNTER — Other Ambulatory Visit: Payer: Self-pay

## 2019-01-15 ENCOUNTER — Encounter: Payer: Self-pay | Admitting: Family Medicine

## 2019-01-15 ENCOUNTER — Ambulatory Visit (INDEPENDENT_AMBULATORY_CARE_PROVIDER_SITE_OTHER): Payer: Medicare Other

## 2019-01-15 DIAGNOSIS — E2839 Other primary ovarian failure: Secondary | ICD-10-CM | POA: Diagnosis not present

## 2019-02-05 ENCOUNTER — Other Ambulatory Visit: Payer: Medicare Other

## 2019-02-09 ENCOUNTER — Other Ambulatory Visit: Payer: Self-pay | Admitting: Family Medicine

## 2019-02-09 DIAGNOSIS — G6289 Other specified polyneuropathies: Secondary | ICD-10-CM

## 2019-03-05 ENCOUNTER — Ambulatory Visit: Payer: Medicare Other | Admitting: Obstetrics and Gynecology

## 2019-03-23 ENCOUNTER — Other Ambulatory Visit: Payer: Self-pay | Admitting: Family Medicine

## 2019-03-26 DIAGNOSIS — H43813 Vitreous degeneration, bilateral: Secondary | ICD-10-CM | POA: Diagnosis not present

## 2019-03-26 DIAGNOSIS — H35342 Macular cyst, hole, or pseudohole, left eye: Secondary | ICD-10-CM | POA: Diagnosis not present

## 2019-03-26 DIAGNOSIS — H43822 Vitreomacular adhesion, left eye: Secondary | ICD-10-CM | POA: Diagnosis not present

## 2019-04-02 ENCOUNTER — Encounter: Payer: Self-pay | Admitting: Obstetrics and Gynecology

## 2019-04-02 DIAGNOSIS — Z1231 Encounter for screening mammogram for malignant neoplasm of breast: Secondary | ICD-10-CM | POA: Diagnosis not present

## 2019-04-02 DIAGNOSIS — Z803 Family history of malignant neoplasm of breast: Secondary | ICD-10-CM | POA: Diagnosis not present

## 2019-04-06 ENCOUNTER — Other Ambulatory Visit: Payer: Self-pay | Admitting: Acute Care

## 2019-04-11 ENCOUNTER — Telehealth: Payer: Self-pay | Admitting: Obstetrics and Gynecology

## 2019-04-11 NOTE — Telephone Encounter (Signed)
Left message on voicemail to call and reschedule cancelled appointment. °

## 2019-04-14 ENCOUNTER — Telehealth: Payer: Self-pay | Admitting: Acute Care

## 2019-04-14 NOTE — Telephone Encounter (Signed)
[employer], about Symbicort refill.  Symbicort refill,per Epic was sent to pharmacy 04/07/19.  Prescription was received, but a PA was required per [employer].  Pharmacist stated she would send a PA request CMM via fax to [phone]. Called and made Patient aware PA was required.  Understanding stated.

## 2019-04-14 NOTE — Telephone Encounter (Signed)
Have attempted to contact Patient's insurance to start a PA.  Unable to speak with anyone.  On hold 20 min.   Checked fax.  At this time PA request has not been received.

## 2019-04-15 NOTE — Telephone Encounter (Signed)
Found PA on the CoverMyMeds website. Key is [id number]. Will await determination.

## 2019-04-15 NOTE — Telephone Encounter (Signed)
Called pharmacy and was advised pts Symbicort needs a PA. Called [facility] at [phone] and was advised pts Symbicort is indeed covered. The rep ran a test claim and it came back as paid. Called pharmacy back and was advised they ran it wrong. The tech re-ran the medication and it shows it as paid but with a high co-pay at $145 month, pt has a high deductible plan. Pt may need pt assistance forms.  LMTCB for pt.

## 2019-04-16 NOTE — Telephone Encounter (Signed)
Called and spoke to patient. Patient stated she won't be able to afford the $145 copay.  Offered patient assistance forms.  Patient asked if we can mail to her.  Printed out the forms and will place in the outgoing mail.

## 2019-05-05 ENCOUNTER — Inpatient Hospital Stay: Payer: Medicare Other | Attending: Oncology

## 2019-05-05 ENCOUNTER — Encounter (INDEPENDENT_AMBULATORY_CARE_PROVIDER_SITE_OTHER): Payer: Self-pay

## 2019-05-05 ENCOUNTER — Other Ambulatory Visit: Payer: Self-pay

## 2019-05-05 DIAGNOSIS — Z8042 Family history of malignant neoplasm of prostate: Secondary | ICD-10-CM | POA: Diagnosis not present

## 2019-05-05 DIAGNOSIS — R7989 Other specified abnormal findings of blood chemistry: Secondary | ICD-10-CM | POA: Insufficient documentation

## 2019-05-05 DIAGNOSIS — Z90722 Acquired absence of ovaries, bilateral: Secondary | ICD-10-CM | POA: Diagnosis not present

## 2019-05-05 DIAGNOSIS — N6092 Unspecified benign mammary dysplasia of left breast: Secondary | ICD-10-CM | POA: Diagnosis not present

## 2019-05-05 DIAGNOSIS — E785 Hyperlipidemia, unspecified: Secondary | ICD-10-CM | POA: Insufficient documentation

## 2019-05-05 DIAGNOSIS — E039 Hypothyroidism, unspecified: Secondary | ICD-10-CM | POA: Diagnosis not present

## 2019-05-05 DIAGNOSIS — C541 Malignant neoplasm of endometrium: Secondary | ICD-10-CM

## 2019-05-05 DIAGNOSIS — Z90710 Acquired absence of both cervix and uterus: Secondary | ICD-10-CM | POA: Diagnosis not present

## 2019-05-05 DIAGNOSIS — Z8542 Personal history of malignant neoplasm of other parts of uterus: Secondary | ICD-10-CM | POA: Insufficient documentation

## 2019-05-05 DIAGNOSIS — J449 Chronic obstructive pulmonary disease, unspecified: Secondary | ICD-10-CM | POA: Diagnosis not present

## 2019-05-05 DIAGNOSIS — Z79890 Hormone replacement therapy (postmenopausal): Secondary | ICD-10-CM | POA: Diagnosis not present

## 2019-05-05 DIAGNOSIS — Z800 Family history of malignant neoplasm of digestive organs: Secondary | ICD-10-CM | POA: Insufficient documentation

## 2019-05-05 DIAGNOSIS — Z803 Family history of malignant neoplasm of breast: Secondary | ICD-10-CM | POA: Insufficient documentation

## 2019-05-05 DIAGNOSIS — I10 Essential (primary) hypertension: Secondary | ICD-10-CM | POA: Diagnosis not present

## 2019-05-05 DIAGNOSIS — Z1239 Encounter for other screening for malignant neoplasm of breast: Secondary | ICD-10-CM

## 2019-05-05 DIAGNOSIS — Z9079 Acquired absence of other genital organ(s): Secondary | ICD-10-CM | POA: Insufficient documentation

## 2019-05-05 DIAGNOSIS — Z87891 Personal history of nicotine dependence: Secondary | ICD-10-CM | POA: Insufficient documentation

## 2019-05-05 LAB — COMPREHENSIVE METABOLIC PANEL
ALT: 64 U/L — ABNORMAL HIGH (ref 0–44)
AST: 55 U/L — ABNORMAL HIGH (ref 15–41)
Albumin: 3.8 g/dL (ref 3.5–5.0)
Alkaline Phosphatase: 179 U/L — ABNORMAL HIGH (ref 38–126)
Anion gap: 8 (ref 5–15)
BUN: 10 mg/dL (ref 8–23)
CO2: 34 mmol/L — ABNORMAL HIGH (ref 22–32)
Calcium: 9.6 mg/dL (ref 8.9–10.3)
Chloride: 99 mmol/L (ref 98–111)
Creatinine, Ser: 0.83 mg/dL (ref 0.44–1.00)
GFR calc Af Amer: >60 mL/min (ref >60)
GFR calc non Af Amer: >60 mL/min (ref >60)
Glucose, Bld: 85 mg/dL (ref 70–99)
Potassium: 3.5 mmol/L (ref 3.5–5.1)
Sodium: 141 mmol/L (ref 135–145)
Total Bilirubin: 0.4 mg/dL (ref 0.3–1.2)
Total Protein: 7.5 g/dL (ref 6.5–8.1)

## 2019-05-05 LAB — CBC WITH DIFFERENTIAL/PLATELET
Abs Immature Granulocytes: 0.04 10*3/uL (ref 0.00–0.07)
Basophils Absolute: 0 10*3/uL (ref 0.0–0.1)
Basophils Relative: 0 %
Eosinophils Absolute: 0.1 10*3/uL (ref 0.0–0.5)
Eosinophils Relative: 2 %
HCT: 41.3 % (ref 36.0–46.0)
Hemoglobin: 13.4 g/dL (ref 12.0–15.0)
Immature Granulocytes: 1 %
Lymphocytes Relative: 23 %
Lymphs Abs: 1.7 10*3/uL (ref 0.7–4.0)
MCH: 31.2 pg (ref 26.0–34.0)
MCHC: 32.4 g/dL (ref 30.0–36.0)
MCV: 96 fL (ref 80.0–100.0)
Monocytes Absolute: 0.5 10*3/uL (ref 0.1–1.0)
Monocytes Relative: 7 %
Neutro Abs: 5 10*3/uL (ref 1.7–7.7)
Neutrophils Relative %: 67 %
Platelets: 247 10*3/uL (ref 150–400)
RBC: 4.3 MIL/uL (ref 3.87–5.11)
RDW: 13.2 % (ref 11.5–15.5)
WBC: 7.4 10*3/uL (ref 4.0–10.5)
nRBC: 0 % (ref 0.0–0.2)

## 2019-05-06 ENCOUNTER — Other Ambulatory Visit: Payer: Self-pay | Admitting: Family Medicine

## 2019-05-06 DIAGNOSIS — G6289 Other specified polyneuropathies: Secondary | ICD-10-CM

## 2019-05-06 DIAGNOSIS — I890 Lymphedema, not elsewhere classified: Secondary | ICD-10-CM
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
## 2019-05-08 ENCOUNTER — Inpatient Hospital Stay (HOSPITAL_BASED_OUTPATIENT_CLINIC_OR_DEPARTMENT_OTHER): Payer: Medicare Other | Admitting: Oncology

## 2019-05-08 ENCOUNTER — Other Ambulatory Visit: Payer: Self-pay

## 2019-05-08 VITALS — BP 119/99 | HR 80 | Temp 94.0°F | Resp 18 | Ht 67.0 in | Wt 287.8 lb

## 2019-05-08 DIAGNOSIS — J449 Chronic obstructive pulmonary disease, unspecified: Secondary | ICD-10-CM

## 2019-05-08 DIAGNOSIS — Z79890 Hormone replacement therapy (postmenopausal): Secondary | ICD-10-CM | POA: Diagnosis not present

## 2019-05-08 DIAGNOSIS — Z6841 Body Mass Index (BMI) 40.0 and over, adult: Secondary | ICD-10-CM

## 2019-05-08 DIAGNOSIS — N6092 Unspecified benign mammary dysplasia of left breast: Secondary | ICD-10-CM | POA: Diagnosis not present

## 2019-05-08 DIAGNOSIS — Z803 Family history of malignant neoplasm of breast: Secondary | ICD-10-CM | POA: Diagnosis not present

## 2019-05-08 DIAGNOSIS — Z1239 Encounter for other screening for malignant neoplasm of breast: Secondary | ICD-10-CM

## 2019-05-08 DIAGNOSIS — C541 Malignant neoplasm of endometrium: Secondary | ICD-10-CM | POA: Diagnosis not present

## 2019-05-08 DIAGNOSIS — Z87891 Personal history of nicotine dependence: Secondary | ICD-10-CM | POA: Diagnosis not present

## 2019-05-08 DIAGNOSIS — Z8542 Personal history of malignant neoplasm of other parts of uterus: Secondary | ICD-10-CM | POA: Diagnosis not present

## 2019-05-08 DIAGNOSIS — R7989 Other specified abnormal findings of blood chemistry: Secondary | ICD-10-CM | POA: Diagnosis not present

## 2019-05-09 ENCOUNTER — Telehealth: Payer: Self-pay | Admitting: Oncology

## 2019-05-09 ENCOUNTER — Ambulatory Visit: Payer: Medicare Other | Admitting: Obstetrics and Gynecology

## 2019-05-09 NOTE — Telephone Encounter (Signed)
I left a message regarding schedule  

## 2019-05-22 DIAGNOSIS — R5382 Chronic fatigue, unspecified: Secondary | ICD-10-CM | POA: Diagnosis not present

## 2019-05-22 DIAGNOSIS — Z23 Encounter for immunization: Secondary | ICD-10-CM | POA: Diagnosis not present

## 2019-05-22 DIAGNOSIS — E559 Vitamin D deficiency, unspecified: Secondary | ICD-10-CM | POA: Diagnosis not present

## 2019-05-22 DIAGNOSIS — E039 Hypothyroidism, unspecified: Secondary | ICD-10-CM | POA: Diagnosis not present

## 2019-05-27 ENCOUNTER — Telehealth: Payer: Self-pay | Admitting: *Deleted

## 2019-05-27 NOTE — Telephone Encounter (Signed)
This RN returned call per VM left by the patient stating " I am waiting to be scheduled for a CT-is it scheduled ?"  Per chart review - pt is high risk and will obtain an MRI in Feb 2021 and then her Mammo 6 months later.  Noted order in Epic.  Obtained pt's answering machine.  Message left informing pt usual procedure is pt will be contacted for scan 1 month prior to date requested. Informed her MD order MRI for Feb 2021.  This RN's name given for office contact if needed.

## 2019-05-29 DIAGNOSIS — E559 Vitamin D deficiency, unspecified: Secondary | ICD-10-CM | POA: Diagnosis not present

## 2019-05-29 DIAGNOSIS — Z8639 Personal history of other endocrine, nutritional and metabolic disease: Secondary | ICD-10-CM | POA: Diagnosis not present

## 2019-05-29 DIAGNOSIS — Z6841 Body Mass Index (BMI) 40.0 and over, adult: Secondary | ICD-10-CM | POA: Diagnosis not present

## 2019-05-29 DIAGNOSIS — E039 Hypothyroidism, unspecified: Secondary | ICD-10-CM | POA: Diagnosis not present

## 2019-05-29 DIAGNOSIS — R5382 Chronic fatigue, unspecified: Secondary | ICD-10-CM | POA: Diagnosis not present
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
## 2019-06-23 ENCOUNTER — Ambulatory Visit (INDEPENDENT_AMBULATORY_CARE_PROVIDER_SITE_OTHER): Payer: Medicare Other | Admitting: Family Medicine

## 2019-06-23 ENCOUNTER — Other Ambulatory Visit: Payer: Self-pay

## 2019-06-23 ENCOUNTER — Encounter: Payer: Self-pay | Admitting: Family Medicine

## 2019-06-23 VITALS — BP 128/78 | HR 71 | Temp 97.6°F | Resp 20 | Ht 67.0 in | Wt 291.0 lb

## 2019-06-23 DIAGNOSIS — K760 Fatty (change of) liver, not elsewhere classified: Secondary | ICD-10-CM | POA: Diagnosis not present

## 2019-06-23 DIAGNOSIS — E039 Hypothyroidism, unspecified: Secondary | ICD-10-CM | POA: Diagnosis not present

## 2019-06-23 DIAGNOSIS — G6289 Other specified polyneuropathies: Secondary | ICD-10-CM

## 2019-06-23 DIAGNOSIS — F341 Dysthymic disorder: Secondary | ICD-10-CM | POA: Diagnosis not present

## 2019-06-23 DIAGNOSIS — I10 Essential (primary) hypertension: Secondary | ICD-10-CM | POA: Diagnosis not present

## 2019-06-23 DIAGNOSIS — R748 Abnormal levels of other serum enzymes: Secondary | ICD-10-CM | POA: Diagnosis not present

## 2019-06-23 LAB — BASIC METABOLIC PANEL
BUN: 13 mg/dL (ref 6–23)
CO2: 35 mEq/L — ABNORMAL HIGH (ref 19–32)
Calcium: 9.5 mg/dL (ref 8.4–10.5)
Chloride: 97 mEq/L (ref 96–112)
Creatinine, Ser: 0.73 mg/dL (ref 0.40–1.20)
GFR: 78.08 mL/min (ref >60.00)
Glucose, Bld: 77 mg/dL (ref 70–99)
Potassium: 3.9 mEq/L (ref 3.5–5.1)
Sodium: 140 mEq/L (ref 135–145)

## 2019-06-23 MED ORDER — TRAMADOL HCL 50 MG PO TABS: ORAL_TABLET | ORAL | 2 refills | Status: DC

## 2019-06-23 MED ORDER — VENLAFAXINE HCL 50 MG PO TABS: ORAL_TABLET | ORAL | 3 refills | Status: DC

## 2019-06-23 NOTE — Patient Instructions (Addendum)
It was great to see you again today Consider getting the shingles vaccine at your drug store at your convenience - [facility] I am gong to set you up for a liver MRI to eval for any other concerns/ evidence of cirrhosis  Take care!

## 2019-07-07 ENCOUNTER — Ambulatory Visit (INDEPENDENT_AMBULATORY_CARE_PROVIDER_SITE_OTHER): Payer: Medicare Other | Admitting: Acute Care

## 2019-07-07 ENCOUNTER — Encounter: Payer: Self-pay | Admitting: Acute Care

## 2019-07-07 ENCOUNTER — Other Ambulatory Visit: Payer: Self-pay

## 2019-07-07 VITALS — BP 122/70 | HR 72 | Temp 97.8°F | Ht 67.0 in | Wt 290.2 lb

## 2019-07-07 DIAGNOSIS — G4733 Obstructive sleep apnea (adult) (pediatric): Secondary | ICD-10-CM | POA: Diagnosis not present

## 2019-07-07 DIAGNOSIS — R0609 Other forms of dyspnea: Secondary | ICD-10-CM

## 2019-07-07 DIAGNOSIS — K219 Gastro-esophageal reflux disease without esophagitis: Secondary | ICD-10-CM | POA: Diagnosis not present

## 2019-07-07 DIAGNOSIS — J84112 Idiopathic pulmonary fibrosis: Secondary | ICD-10-CM

## 2019-07-07 DIAGNOSIS — R918 Other nonspecific abnormal finding of lung field: Secondary | ICD-10-CM | POA: Diagnosis not present

## 2019-07-07 DIAGNOSIS — R0600 Dyspnea, unspecified: Secondary | ICD-10-CM | POA: Diagnosis not present

## 2019-07-07 DIAGNOSIS — J849 Interstitial pulmonary disease, unspecified: Secondary | ICD-10-CM

## 2019-07-07 NOTE — Assessment & Plan Note (Signed)
Pt did not have HRCT as follow up 02/2019 most likely due to COVID pandemic Plan HRCT Chest as follow up of ? NSIP/UIP per 02/2018 scan to check for stability vs progression

## 2019-07-07 NOTE — Patient Instructions (Addendum)
It is good to see you today.  I'm glad you have had a stable interval. We will continue your Symbicort 80 two puffs once daily ( Adjusted for your eyes) Rinse mouth after use We will give you some samples Continue to use Albuterol as needed for breakthrough shortness of breath or wheezing. We will refill your albuterol  Continue Mucinex 1200 mg once daily or as needed.  Continue Zyrtec at night as you have been doing. Continue to work on weight loss as able. Follow up in 6 months  Please establish with Dr. Halford Chessman as primary MD. Please make  a visit with him in 6 months, then with Sarah NP in 12 months.. Please contact office for sooner follow up if symptoms do not improve or worsen or seek emergency care
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
## 2019-07-08 NOTE — Progress Notes (Signed)
Reviewed and agree with assessment/plan.   Zakhari Fogel, MD [facility] 08/16/2016, 12:24 PM Pager:  [phone]  

## 2019-07-10 ENCOUNTER — Telehealth: Payer: Self-pay | Admitting: Acute Care

## 2019-07-10 ENCOUNTER — Other Ambulatory Visit: Payer: Medicare Other

## 2019-07-10 NOTE — Telephone Encounter (Signed)
Spoke to pt & gave her appt info.  Nothing further needed. 

## 2019-07-10 NOTE — Telephone Encounter (Signed)
I had called pt because she has CT scheduled for this morning.  I have called twice and left a vm with my number both times.  I just tried to call her and left another vm.

## 2019-07-14 ENCOUNTER — Other Ambulatory Visit: Payer: Self-pay

## 2019-07-14 ENCOUNTER — Ambulatory Visit (INDEPENDENT_AMBULATORY_CARE_PROVIDER_SITE_OTHER): Payer: Medicare Other

## 2019-07-14 DIAGNOSIS — J84112 Idiopathic pulmonary fibrosis: Secondary | ICD-10-CM | POA: Diagnosis not present

## 2019-07-14 DIAGNOSIS — J849 Interstitial pulmonary disease, unspecified: Secondary | ICD-10-CM

## 2019-07-14 DIAGNOSIS — R0602 Shortness of breath: Secondary | ICD-10-CM | POA: Diagnosis not present

## 2019-07-18 ENCOUNTER — Other Ambulatory Visit: Payer: Self-pay | Admitting: Primary Care

## 2019-07-18 MED ORDER — ALBUTEROL SULFATE HFA 108 (90 BASE) MCG/ACT IN AERS: 1.0000 | INHALATION_SPRAY | Freq: Four times a day (QID) | RESPIRATORY_TRACT | 2 refills | Status: DC | PRN

## 2019-07-24 ENCOUNTER — Ambulatory Visit
Admission: RE | Admit: 2019-07-24 | Discharge: 2019-07-24 | Disposition: A | Discharge status: Home / Self Care | Payer: Medicare Other | Source: Ambulatory Visit | Attending: Family Medicine | Admitting: Family Medicine

## 2019-07-24 ENCOUNTER — Other Ambulatory Visit: Payer: Self-pay | Admitting: Acute Care

## 2019-07-24 DIAGNOSIS — E559 Vitamin D deficiency, unspecified: Secondary | ICD-10-CM | POA: Diagnosis not present

## 2019-07-24 DIAGNOSIS — J84112 Idiopathic pulmonary fibrosis: Secondary | ICD-10-CM

## 2019-07-24 DIAGNOSIS — R935 Abnormal findings on diagnostic imaging of other abdominal regions, including retroperitoneum: Secondary | ICD-10-CM | POA: Diagnosis not present

## 2019-07-24 DIAGNOSIS — E039 Hypothyroidism, unspecified: Secondary | ICD-10-CM | POA: Diagnosis not present

## 2019-07-24 DIAGNOSIS — K760 Fatty (change of) liver, not elsewhere classified: Secondary | ICD-10-CM

## 2019-07-24 MED ORDER — GADOBENATE DIMEGLUMINE 529 MG/ML IV SOLN
20.0000 mL | Freq: Once | INTRAVENOUS | Status: AC | PRN
  Administered 2019-07-24: 20 mL via INTRAVENOUS

## 2019-07-25 ENCOUNTER — Encounter: Payer: Self-pay | Admitting: Family Medicine

## 2019-07-25 NOTE — Progress Notes (Signed)
Letter has been mailed.

## 2019-07-30 ENCOUNTER — Telehealth: Payer: Self-pay | Admitting: Acute Care

## 2019-07-30 DIAGNOSIS — Z8639 Personal history of other endocrine, nutritional and metabolic disease: Secondary | ICD-10-CM | POA: Diagnosis not present

## 2019-07-30 DIAGNOSIS — E039 Hypothyroidism, unspecified: Secondary | ICD-10-CM | POA: Diagnosis not present

## 2019-07-30 DIAGNOSIS — E559 Vitamin D deficiency, unspecified: Secondary | ICD-10-CM | POA: Diagnosis not present

## 2019-07-30 DIAGNOSIS — Z6841 Body Mass Index (BMI) 40.0 and over, adult: Secondary | ICD-10-CM | POA: Diagnosis not present

## 2019-07-30 DIAGNOSIS — R5382 Chronic fatigue, unspecified: Secondary | ICD-10-CM | POA: Diagnosis not present

## 2019-07-30 NOTE — Telephone Encounter (Signed)
Advised pt of results. Pt understood and nothing further is needed.   

## 2019-08-16 ENCOUNTER — Encounter: Payer: Self-pay | Admitting: Emergency Medicine

## 2019-08-16 ENCOUNTER — Emergency Department (INDEPENDENT_AMBULATORY_CARE_PROVIDER_SITE_OTHER)
Admission: EM | Admit: 2019-08-16 | Discharge: 2019-08-16 | Disposition: A | Discharge status: Home / Self Care | Payer: Medicare Other | Source: Home / Self Care | Attending: Emergency Medicine | Admitting: Emergency Medicine

## 2019-08-16 ENCOUNTER — Other Ambulatory Visit: Payer: Self-pay

## 2019-08-16 DIAGNOSIS — N3000 Acute cystitis without hematuria: Secondary | ICD-10-CM

## 2019-08-16 DIAGNOSIS — R300 Dysuria: Secondary | ICD-10-CM

## 2019-08-16 MED ORDER — CEPHALEXIN 500 MG PO CAPS: 500.0000 mg | ORAL_CAPSULE | Freq: Two times a day (BID) | ORAL | 0 refills | Status: DC
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
## 2019-08-16 NOTE — Discharge Instructions (Signed)
Take antibiotics as instructed. Drink good quantities of fluids. We will call you with culture results.

## 2019-08-16 NOTE — ED Triage Notes (Signed)
Pt states yesterday she woke up with dysuria and bladder pain. States she usually gets about 1 UTI yearly. Takes pyridium.

## 2019-08-18 ENCOUNTER — Telehealth: Payer: Self-pay

## 2019-08-18 LAB — URINE CULTURE
MICRO NUMBER:: 1231579
SPECIMEN QUALITY:: ADEQUATE

## 2019-08-18 NOTE — Telephone Encounter (Signed)
Left message on VM to call UC if sx not resolving.  Contact information given.

## 2019-08-19 ENCOUNTER — Other Ambulatory Visit: Payer: Self-pay | Admitting: Family Medicine

## 2019-08-19 DIAGNOSIS — I10 Essential (primary) hypertension: Secondary | ICD-10-CM

## 2019-08-19 MED ORDER — LOSARTAN POTASSIUM 50 MG PO TABS: 50.0000 mg | ORAL_TABLET | Freq: Two times a day (BID) | ORAL | 2 refills | Status: DC

## 2019-08-19 NOTE — Telephone Encounter (Signed)
Medication Refill - Medication: losartan (COZAAR) 50 MG tablet   Pt has been calling since last Monday, the pharmacy has given her conflicting answers.   Has the patient contacted their pharmacy? Yes.   (Agent: If no, request that the patient contact the pharmacy for the refill.) (Agent: If yes, when and what did the pharmacy advise?)  Preferred Pharmacy (with phone number or street name):  [facility] [id number] - [place], [place] - [address] AT [address]  [address] [place] [address]  Phone: [phone] Fax: [phone]     Agent: Please be advised that RX refills may take up to 3 business days. We ask that you follow-up with your pharmacy.

## 2019-09-03 ENCOUNTER — Ambulatory Visit: Payer: Medicare Other | Admitting: Obstetrics and Gynecology

## 2019-09-03 DIAGNOSIS — E039 Hypothyroidism, unspecified: Secondary | ICD-10-CM | POA: Diagnosis not present

## 2019-09-08 DIAGNOSIS — E559 Vitamin D deficiency, unspecified: Secondary | ICD-10-CM | POA: Diagnosis not present

## 2019-09-08 DIAGNOSIS — R5382 Chronic fatigue, unspecified: Secondary | ICD-10-CM | POA: Diagnosis not present

## 2019-09-08 DIAGNOSIS — Z6841 Body Mass Index (BMI) 40.0 and over, adult: Secondary | ICD-10-CM | POA: Diagnosis not present

## 2019-09-08 DIAGNOSIS — E039 Hypothyroidism, unspecified: Secondary | ICD-10-CM | POA: Diagnosis not present

## 2019-09-08 DIAGNOSIS — Z8639 Personal history of other endocrine, nutritional and metabolic disease: Secondary | ICD-10-CM | POA: Diagnosis not present

## 2019-09-09 ENCOUNTER — Other Ambulatory Visit: Payer: Self-pay

## 2019-09-09 ENCOUNTER — Ambulatory Visit: Payer: Medicare Other

## 2019-09-09 DIAGNOSIS — G4733 Obstructive sleep apnea (adult) (pediatric): Secondary | ICD-10-CM

## 2019-09-10 DIAGNOSIS — G4733 Obstructive sleep apnea (adult) (pediatric): Secondary | ICD-10-CM | POA: Diagnosis not present

## 2019-09-16 ENCOUNTER — Other Ambulatory Visit (HOSPITAL_COMMUNITY)
Admission: RE | Admit: 2019-09-16 | Discharge: 2019-09-16 | Disposition: A | Discharge status: Home / Self Care | Payer: Medicare Other | Source: Ambulatory Visit | Attending: Acute Care | Admitting: Acute Care

## 2019-09-16 ENCOUNTER — Other Ambulatory Visit: Payer: Self-pay | Admitting: Family Medicine

## 2019-09-16 DIAGNOSIS — Z01812 Encounter for preprocedural laboratory examination: Secondary | ICD-10-CM | POA: Diagnosis not present

## 2019-09-16 DIAGNOSIS — Z20822 Contact with and (suspected) exposure to covid-19: Secondary | ICD-10-CM | POA: Insufficient documentation

## 2019-09-16 DIAGNOSIS — G6289 Other specified polyneuropathies: Secondary | ICD-10-CM

## 2019-09-16 LAB — SARS CORONAVIRUS 2 (TAT 6-24 HRS): SARS Coronavirus 2: NEGATIVE

## 2019-09-16 MED ORDER — PREGABALIN 100 MG PO CAPS: ORAL_CAPSULE | ORAL | 5 refills | Status: DC

## 2019-09-16 NOTE — Telephone Encounter (Signed)
pregabalin (LYRICA) 100 MG capsule    Has the patient contacted their pharmacy? Yes.     Pharmacy Contact  Telephone Fax  [phone]  Pharmacy Address and Hours  Address Hours  [address]  [place] [address]

## 2019-09-17 NOTE — Progress Notes (Signed)
Please call patient and let them know her covid test was negative. Thanks so much

## 2019-09-18 ENCOUNTER — Telehealth: Payer: Self-pay | Admitting: Acute Care

## 2019-09-18 NOTE — Telephone Encounter (Signed)
Called and spoke with patient.  Patient stated she was contacted earlier today from [place]. Patient has PFT scheduled 09/19/19 and received covid results.  Nothing further at this time.

## 2019-09-19 ENCOUNTER — Ambulatory Visit (INDEPENDENT_AMBULATORY_CARE_PROVIDER_SITE_OTHER): Payer: Medicare Other | Admitting: Pulmonary Disease

## 2019-09-19 ENCOUNTER — Other Ambulatory Visit: Payer: Self-pay

## 2019-09-19 DIAGNOSIS — J84112 Idiopathic pulmonary fibrosis: Secondary | ICD-10-CM

## 2019-09-19 LAB — PULMONARY FUNCTION TEST
DL/VA % pred: 116 %
DL/VA: 4.72 ml/min/mmHg/L
DLCO unc % pred: 98 %
DLCO unc: 20.59 ml/min/mmHg
FEF 25-75 Post: 1.81 L/sec
FEF 25-75 Pre: 1.39 L/sec
FEF2575-%Change-Post: 30 %
FEF2575-%Pred-Post: 95 %
FEF2575-%Pred-Pre: 73 %
FEV1-%Change-Post: 6 %
FEV1-%Pred-Post: 75 %
FEV1-%Pred-Pre: 70 %
FEV1-Post: 1.82 L
FEV1-Pre: 1.71 L
FEV1FVC-%Change-Post: -2 %
FEV1FVC-%Pred-Pre: 103 %
FEV6-%Change-Post: 9 %
FEV6-%Pred-Post: 78 %
FEV6-%Pred-Pre: 71 %
FEV6-Post: 2.39 L
FEV6-Pre: 2.19 L
FEV6FVC-%Pred-Post: 104 %
FEV6FVC-%Pred-Pre: 104 %
FVC-%Change-Post: 9 %
FVC-%Pred-Post: 74 %
FVC-%Pred-Pre: 68 %
FVC-Post: 2.39 L
FVC-Pre: 2.19 L
Post FEV1/FVC ratio: 76 %
Post FEV6/FVC ratio: 100 %
Pre FEV1/FVC ratio: 78 %
Pre FEV6/FVC Ratio: 100 %
RV % pred: 82 %
RV: 1.97 L
TLC % pred: 92 %
TLC: 5.02 L

## 2019-09-19 NOTE — Progress Notes (Signed)
PFT done today. 
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
## 2019-09-25 ENCOUNTER — Ambulatory Visit: Payer: Medicare Other | Admitting: Family Medicine

## 2019-09-26 ENCOUNTER — Telehealth: Payer: Self-pay | Admitting: Pulmonary Disease

## 2019-09-26 NOTE — Telephone Encounter (Signed)
PFT 09/19/19 >> FEV1 2.39 (74%), FEV1% 76, TLC 5.02 (92%), DLCO 98%, +BD from FEF 25-75%   Please let her know her PFT was normal.

## 2019-09-29 NOTE — Telephone Encounter (Signed)
Called the patient and advised her of the results. Patient voiced understanding. Nothing further needed at this time. 

## 2019-10-07 ENCOUNTER — Other Ambulatory Visit: Payer: Self-pay | Admitting: Family Medicine

## 2019-10-07 MED ORDER — ATORVASTATIN CALCIUM 20 MG PO TABS: ORAL_TABLET | ORAL | 3 refills | Status: DC

## 2019-10-07 NOTE — Telephone Encounter (Signed)
Medication: atorvastatin (LIPITOR) 20 MG tablet    Has the patient contacted their pharmacy? No.   (If no, request that the patient contact the  pharmacy for the refill.) (If yes, when and what did the pharmacy advise?)  Preferred Pharmacy (with phone number or street name): [facility] [id number] - [place], [place] - [address]  [address], [place] [address]  Phone:  [phone] Fax:  [phone]  DEA #:  [id number]    Agent: Please be advised that RX refills may take up to 3 business days. We ask that you follow-up with your pharmacy.

## 2019-10-08 NOTE — Telephone Encounter (Signed)
Medication refilled

## 2019-10-14 ENCOUNTER — Other Ambulatory Visit: Payer: Self-pay

## 2019-10-14 ENCOUNTER — Ambulatory Visit
Admission: RE | Admit: 2019-10-14 | Discharge: 2019-10-14 | Disposition: A | Discharge status: Home / Self Care | Payer: Medicare Other | Source: Ambulatory Visit | Attending: Oncology | Admitting: Oncology

## 2019-10-14 ENCOUNTER — Telehealth: Payer: Self-pay | Admitting: Adult Health

## 2019-10-14 DIAGNOSIS — C541 Malignant neoplasm of endometrium: Secondary | ICD-10-CM

## 2019-10-14 DIAGNOSIS — J449 Chronic obstructive pulmonary disease, unspecified: Secondary | ICD-10-CM

## 2019-10-14 DIAGNOSIS — Z1239 Encounter for other screening for malignant neoplasm of breast: Secondary | ICD-10-CM

## 2019-10-14 DIAGNOSIS — Z6841 Body Mass Index (BMI) 40.0 and over, adult: Secondary | ICD-10-CM

## 2019-10-14 DIAGNOSIS — Z803 Family history of malignant neoplasm of breast: Secondary | ICD-10-CM | POA: Diagnosis not present

## 2019-10-14 DIAGNOSIS — N6489 Other specified disorders of breast: Secondary | ICD-10-CM | POA: Diagnosis not present

## 2019-10-14 MED ORDER — GADOBUTROL 1 MMOL/ML IV SOLN
10.0000 mL | Freq: Once | INTRAVENOUS | Status: AC | PRN
  Administered 2019-10-14: 10 mL via INTRAVENOUS

## 2019-10-14 NOTE — Telephone Encounter (Signed)
Called patient and gave normal MRI results.  Patient thanked me for call.  Wilber Bihari, NP
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
## 2019-10-21 ENCOUNTER — Other Ambulatory Visit: Payer: Self-pay

## 2019-10-21 ENCOUNTER — Encounter: Payer: Self-pay | Admitting: *Deleted

## 2019-10-21 ENCOUNTER — Ambulatory Visit (INDEPENDENT_AMBULATORY_CARE_PROVIDER_SITE_OTHER): Payer: Medicare Other | Admitting: *Deleted

## 2019-10-21 VITALS — BP 120/82 | HR 84 | Wt 293.0 lb

## 2019-10-21 DIAGNOSIS — Z0000 Encounter for general adult medical examination without abnormal findings: Secondary | ICD-10-CM | POA: Diagnosis not present
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
## 2019-10-21 NOTE — Patient Instructions (Addendum)
It was good to see you again today, take care I think trying chiropractic care is a fine idea  [facility] [address], [place], [place] [address] Phone: [phone]  We can try increasing your effexor to 150 total per day; watch for any sign of serotonin syndrome   Please see me in 6 months I will be in touch with your labs

## 2019-10-21 NOTE — Patient Instructions (Signed)
See you next year!  Continue to eat heart healthy diet (full of fruits, vegetables, whole grains, lean protein, water--limit salt, fat, and sugar intake) and increase physical activity as tolerated.  Continue doing brain stimulating activities (puzzles, reading, adult coloring books, staying active) to keep memory sharp.   Bring a copy of your living will and/or healthcare power of attorney to your next office visit.   Shelly Mosley , Thank you for taking time to come for your Medicare Wellness Visit. I appreciate your ongoing commitment to your health goals. Please review the following plan we discussed and let me know if I can assist you in the future.   These are the goals we discussed: Goals    . Decrease diet coke to one per day.    . Weight (lb) < 250 lb (113.4 kg)     Using portion control and doing chair yoga at least 2x/week       This is a list of the screening recommended for you and due dates:  Health Maintenance  Topic Date Due  . Tetanus Vaccine  08/21/2020  . Mammogram  04/01/2021  . Colon Cancer Screening  09/25/2024  . Flu Shot  Completed  . DEXA scan (bone density measurement)  Completed  .  Hepatitis C: One time screening is recommended by Center for Disease Control  (CDC) for  adults born from [birth year] through 1965.   Completed  . Pneumonia vaccines  Completed    Preventive Care 61 Years and Older, Female Preventive care refers to lifestyle choices and visits with your health care provider that can promote health and wellness. This includes:  A yearly physical exam. This is also called an annual well check.  Regular dental and eye exams.  Immunizations.  Screening for certain conditions.  Healthy lifestyle choices, such as diet and exercise. What can I expect for my preventive care visit? Physical exam Your health care provider will check:  Height and weight. These may be used to calculate body mass index (BMI), which is a measurement that tells if you are  at a healthy weight.  Heart rate and blood pressure.  Your skin for abnormal spots. Counseling Your health care provider may ask you questions about:  Alcohol, tobacco, and drug use.  Emotional well-being.  Home and relationship well-being.  Sexual activity.  Eating habits.  History of falls.  Memory and ability to understand (cognition).  Work and work Statistician.  Pregnancy and menstrual history. What immunizations do I need?  Influenza (flu) vaccine  This is recommended every year. Tetanus, diphtheria, and pertussis (Tdap) vaccine  You may need a Td booster every 10 years. Varicella (chickenpox) vaccine  You may need this vaccine if you have not already been vaccinated. Zoster (shingles) vaccine  You may need this after age 57. Pneumococcal conjugate (PCV13) vaccine  One dose is recommended after age 6. Pneumococcal polysaccharide (PPSV23) vaccine  One dose is recommended after age 23. Measles, mumps, and rubella (MMR) vaccine  You may need at least one dose of MMR if you were born in 1957 or later. You may also need a second dose. Meningococcal conjugate (MenACWY) vaccine  You may need this if you have certain conditions. Hepatitis A vaccine  You may need this if you have certain conditions or if you travel or work in places where you may be exposed to hepatitis A. Hepatitis B vaccine  You may need this if you have certain conditions or if you travel or work in  places where you may be exposed to hepatitis B. Haemophilus influenzae type b (Hib) vaccine  You may need this if you have certain conditions. You may receive vaccines as individual doses or as more than one vaccine together in one shot (combination vaccines). Talk with your health care provider about the risks and benefits of combination vaccines. What tests do I need? Blood tests  Lipid and cholesterol levels. These may be checked every 5 years, or more frequently depending on your  overall health.  Hepatitis C test.  Hepatitis B test. Screening  Lung cancer screening. You may have this screening every year starting at age 47 if you have a 30-pack-year history of smoking and currently smoke or have quit within the past 15 years.  Colorectal cancer screening. All adults should have this screening starting at age 77 and continuing until age 74. Your health care provider may recommend screening at age 53 if you are at increased risk. You will have tests every 1-10 years, depending on your results and the type of screening test.  Diabetes screening. This is done by checking your blood sugar (glucose) after you have not eaten for a while (fasting). You may have this done every 1-3 years.  Mammogram. This may be done every 1-2 years. Talk with your health care provider about how often you should have regular mammograms.  BRCA-related cancer screening. This may be done if you have a family history of breast, ovarian, tubal, or peritoneal cancers. Other tests  Sexually transmitted disease (STD) testing.  Bone density scan. This is done to screen for osteoporosis. You may have this done starting at age 59. Follow these instructions at home: Eating and drinking  Eat a diet that includes fresh fruits and vegetables, whole grains, lean protein, and low-fat dairy products. Limit your intake of foods with high amounts of sugar, saturated fats, and salt.  Take vitamin and mineral supplements as recommended by your health care provider.  Do not drink alcohol if your health care provider tells you not to drink.  If you drink alcohol: ? Limit how much you have to 0-1 drink a day. ? Be aware of how much alcohol is in your drink. In the [place] one drink equals one 12 oz bottle of beer (355 mL), one 5 oz glass of wine (148 mL), or one 1 oz glass of hard liquor (44 mL). Lifestyle  Take daily care of your teeth and gums.  Stay active. Exercise for at least 30 minutes on 5 or more  days each week.  Do not use any products that contain nicotine or tobacco, such as cigarettes, e-cigarettes, and chewing tobacco. If you need help quitting, ask your health care provider.  If you are sexually active, practice safe sex. Use a condom or other form of protection in order to prevent STIs (sexually transmitted infections).  Talk with your health care provider about taking a low-dose aspirin or statin. What's next?  Go to your health care provider once a year for a well check visit.  Ask your health care provider how often you should have your eyes and teeth checked.  Stay up to date on all vaccines. This information is not intended to replace advice given to you by your health care provider. Make sure you discuss any questions you have with your health care provider. Document Revised: 08/01/2018 Document Reviewed: 08/01/2018 Elsevier Patient Education  2020 [employer].

## 2019-10-22 ENCOUNTER — Other Ambulatory Visit: Payer: Self-pay

## 2019-10-23 ENCOUNTER — Ambulatory Visit (INDEPENDENT_AMBULATORY_CARE_PROVIDER_SITE_OTHER): Payer: Medicare Other | Admitting: Family Medicine

## 2019-10-23 ENCOUNTER — Encounter: Payer: Self-pay | Admitting: Family Medicine

## 2019-10-23 ENCOUNTER — Other Ambulatory Visit: Payer: Self-pay

## 2019-10-23 VITALS — BP 130/86 | HR 83 | Temp 97.1°F | Resp 16 | Ht 67.0 in | Wt 292.0 lb

## 2019-10-23 DIAGNOSIS — M545 Low back pain: Secondary | ICD-10-CM

## 2019-10-23 DIAGNOSIS — E039 Hypothyroidism, unspecified: Secondary | ICD-10-CM | POA: Diagnosis not present

## 2019-10-23 DIAGNOSIS — G8929 Other chronic pain: Secondary | ICD-10-CM

## 2019-10-23 DIAGNOSIS — I890 Lymphedema, not elsewhere classified: Secondary | ICD-10-CM | POA: Diagnosis not present

## 2019-10-23 DIAGNOSIS — G6289 Other specified polyneuropathies: Secondary | ICD-10-CM

## 2019-10-23 DIAGNOSIS — R7989 Other specified abnormal findings of blood chemistry: Secondary | ICD-10-CM | POA: Diagnosis not present

## 2019-10-23 DIAGNOSIS — I10 Essential (primary) hypertension: Secondary | ICD-10-CM | POA: Diagnosis not present

## 2019-10-23 DIAGNOSIS — F341 Dysthymic disorder: Secondary | ICD-10-CM

## 2019-10-23 DIAGNOSIS — E782 Mixed hyperlipidemia: Secondary | ICD-10-CM | POA: Diagnosis not present

## 2019-10-23 LAB — COMPREHENSIVE METABOLIC PANEL
ALT: 60 U/L — ABNORMAL HIGH (ref 0–35)
AST: 52 U/L — ABNORMAL HIGH (ref 0–37)
Albumin: 4.1 g/dL (ref 3.5–5.2)
Alkaline Phosphatase: 185 U/L — ABNORMAL HIGH (ref 39–117)
BUN: 11 mg/dL (ref 6–23)
CO2: 35 mEq/L — ABNORMAL HIGH (ref 19–32)
Calcium: 9.9 mg/dL (ref 8.4–10.5)
Chloride: 96 mEq/L (ref 96–112)
Creatinine, Ser: 0.79 mg/dL (ref 0.40–1.20)
GFR: 71.21 mL/min (ref >60.00)
Glucose, Bld: 91 mg/dL (ref 70–99)
Potassium: 3.9 mEq/L (ref 3.5–5.1)
Sodium: 139 mEq/L (ref 135–145)
Total Bilirubin: 0.4 mg/dL (ref 0.2–1.2)
Total Protein: 7.3 g/dL (ref 6.0–8.3)

## 2019-10-23 LAB — CBC
HCT: 41.8 % (ref 36.0–46.0)
Hemoglobin: 13.8 g/dL (ref 12.0–15.0)
MCHC: 33 g/dL (ref 30.0–36.0)
MCV: 96.2 fl (ref 78.0–100.0)
Platelets: 272 10*3/uL (ref 150.0–400.0)
RBC: 4.35 Mil/uL (ref 3.87–5.11)
RDW: 13.6 % (ref 11.5–15.5)
WBC: 6.6 10*3/uL (ref 4.0–10.5)

## 2019-10-23 LAB — LIPID PANEL
Cholesterol: 150 mg/dL (ref 0–200)
HDL: 49 mg/dL (ref >39.00)
LDL Cholesterol: 61 mg/dL (ref 0–99)
NonHDL: 100.9
Total CHOL/HDL Ratio: 3
Triglycerides: 198 mg/dL — ABNORMAL HIGH (ref 0.0–149.0)
VLDL: 39.6 mg/dL (ref 0.0–40.0)

## 2019-10-23 MED ORDER — VENLAFAXINE HCL 50 MG PO TABS: ORAL_TABLET | ORAL | 3 refills | Status: DC

## 2019-10-23 MED ORDER — TRAMADOL HCL 50 MG PO TABS: ORAL_TABLET | ORAL | 2 refills | Status: DC

## 2019-10-23 MED ORDER — FUROSEMIDE 80 MG PO TABS: ORAL_TABLET | ORAL | 3 refills | Status: DC

## 2019-10-24 NOTE — Addendum Note (Signed)
Addended by: Darreld Mclean on: 10/24/2019 06:48 AM   Modules accepted: Orders

## 2019-11-10 ENCOUNTER — Telehealth: Payer: Self-pay | Admitting: Family Medicine

## 2019-11-10 NOTE — Telephone Encounter (Signed)
Medications both refilled on 10/23/19 with additional refilled.

## 2019-11-10 NOTE — Telephone Encounter (Signed)
Medication: traMADol (ULTRAM) 50 MG tablet  furosemide (LASIX) 80 MG tablet [id number]   Has the patient contacted their pharmacy? Yes.   (If no, request that the patient contact the pharmacy for the refill.) (If yes, when and what did the pharmacy advise?)  Preferred Pharmacy (with phone number or street name): [id number] - [place], [place] - [address] AT [address]  [address], [place] [address]  Phone:  [phone] Fax:  [phone]  DEA #:  [id number]  Agent: Please be advised that RX refills may take up to 3 business days. We ask that you follow-up with your pharmacy.

## 2019-11-10 NOTE — Telephone Encounter (Signed)
Requesting:Tramadol Contract:NONE  ZW:[id number] Last Visit: 10/23/2019 Next Visit:none scheduled Last Refill:10/23/2019  Please Advise

## 2019-11-18 ENCOUNTER — Other Ambulatory Visit: Payer: Self-pay
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
## 2019-11-19 ENCOUNTER — Ambulatory Visit (INDEPENDENT_AMBULATORY_CARE_PROVIDER_SITE_OTHER): Payer: Medicare Other | Admitting: Obstetrics and Gynecology

## 2019-11-19 ENCOUNTER — Encounter: Payer: Self-pay | Admitting: Obstetrics and Gynecology

## 2019-11-19 ENCOUNTER — Telehealth: Payer: Self-pay | Admitting: Obstetrics and Gynecology

## 2019-11-19 VITALS — BP 122/70 | HR 80 | Temp 98.1°F | Ht 66.75 in | Wt 294.0 lb

## 2019-11-19 DIAGNOSIS — N949 Unspecified condition associated with female genital organs and menstrual cycle: Secondary | ICD-10-CM | POA: Diagnosis not present

## 2019-11-19 DIAGNOSIS — Z124 Encounter for screening for malignant neoplasm of cervix: Secondary | ICD-10-CM

## 2019-11-19 DIAGNOSIS — N842 Polyp of vagina: Secondary | ICD-10-CM | POA: Diagnosis not present

## 2019-11-19 DIAGNOSIS — Z01419 Encounter for gynecological examination (general) (routine) without abnormal findings: Secondary | ICD-10-CM | POA: Diagnosis not present

## 2019-11-19 MED ORDER — NYSTATIN 100000 UNIT/GM EX POWD: 1.0000 "application " | Freq: Three times a day (TID) | CUTANEOUS | 3 refills | Status: DC

## 2019-11-19 NOTE — Telephone Encounter (Signed)
Left message to return call to Hayley at [phone] 

## 2019-11-19 NOTE — Patient Instructions (Signed)

## 2019-11-19 NOTE — Telephone Encounter (Signed)
Patient states she is returning a call to [facility].

## 2019-11-19 NOTE — Telephone Encounter (Signed)
Patient is returning call to Hayley. 

## 2019-11-20 NOTE — Telephone Encounter (Signed)
Spoke with patient regarding benefits for recommended ultrasound and vaginal biopsy. Patient is aware that ultrasound is transvaginal. Patient acknowledges understanding of information presented. Patient is aware of cancellation policy.

## 2019-12-04 ENCOUNTER — Other Ambulatory Visit: Payer: Medicare Other | Admitting: Obstetrics and Gynecology

## 2019-12-04 ENCOUNTER — Other Ambulatory Visit: Payer: Medicare Other

## 2019-12-08 ENCOUNTER — Other Ambulatory Visit: Payer: Self-pay

## 2019-12-08 ENCOUNTER — Ambulatory Visit (INDEPENDENT_AMBULATORY_CARE_PROVIDER_SITE_OTHER): Payer: Medicare Other | Admitting: Pulmonary Disease

## 2019-12-08 ENCOUNTER — Encounter: Payer: Self-pay | Admitting: Pulmonary Disease

## 2019-12-08 VITALS — BP 136/80 | HR 77 | Ht 66.75 in | Wt 297.6 lb

## 2019-12-08 DIAGNOSIS — G4733 Obstructive sleep apnea (adult) (pediatric): Secondary | ICD-10-CM | POA: Diagnosis not present

## 2019-12-08 DIAGNOSIS — J410 Simple chronic bronchitis: Secondary | ICD-10-CM | POA: Diagnosis not present

## 2019-12-08 DIAGNOSIS — E039 Hypothyroidism, unspecified: Secondary | ICD-10-CM | POA: Diagnosis not present

## 2019-12-08 DIAGNOSIS — J849 Interstitial pulmonary disease, unspecified: Secondary | ICD-10-CM | POA: Diagnosis not present

## 2019-12-08 DIAGNOSIS — J301 Allergic rhinitis due to pollen: Secondary | ICD-10-CM | POA: Diagnosis not present

## 2019-12-08 DIAGNOSIS — Z6841 Body Mass Index (BMI) 40.0 and over, adult: Secondary | ICD-10-CM | POA: Diagnosis not present

## 2019-12-08 DIAGNOSIS — J432 Centrilobular emphysema: Secondary | ICD-10-CM | POA: Diagnosis not present

## 2019-12-08 DIAGNOSIS — E559 Vitamin D deficiency, unspecified: Secondary | ICD-10-CM | POA: Diagnosis not present

## 2019-12-08 LAB — VITAMIN D 25 HYDROXY (VIT D DEFICIENCY, FRACTURES): Vit D, 25-Hydroxy: 34.9

## 2019-12-08 MED ORDER — BUDESONIDE-FORMOTEROL FUMARATE 160-4.5 MCG/ACT IN AERO: 2.0000 | INHALATION_SPRAY | Freq: Two times a day (BID) | RESPIRATORY_TRACT | 6 refills | Status: DC

## 2019-12-08 MED ORDER — MONTELUKAST SODIUM 10 MG PO TABS: 10.0000 mg | ORAL_TABLET | Freq: Every day | ORAL | 5 refills | Status: DC

## 2019-12-08 NOTE — Progress Notes (Signed)
[facility]  Chief Complaint  Patient presents with  . Follow-up    Pt has had complaints of wheezing x3 weeks, and has also been coughing in the mornings due to her allergies bothering her. Pt stated that she believes her breathing has gotten a little worse.    Constitutional:  BP 136/80 (BP Location: Left Arm, Patient Position: Sitting, Cuff Size: Large)   Pulse 77   Ht 5' 6.75" (1.695 m)   Wt 297 lb 9.6 oz (135 kg)   LMP 11/20/2011 (Approximate)   SpO2 94% Comment: room air  BMI 46.96 kg/m   Past Medical History:  Anxiety, Depression, OA, Back pain, Endometrial cancer, Fibromyalgia, GERD, HLD, HTN, Hypothyroidism, IBS, Neuropathy  Summary:  Shelly Mosley is a 74 y.o. female former smoker with emphysema, ILD, chronic bronchitis, OSA.  Subjective:   She has noticed more cough and chest tightness.  Has some wheeze too.  Going on for 3 weeks since pollen kicked up.  No fever, hemoptysis, chest pain, skin rash, GI symptoms, leg swelling.  Hasn't tried CPAP.  Was told sleep apnea mild.   Physical Exam:   Appearance - well kempt   ENMT - no sinus tenderness, no oral exudate, no LAN, Mallampati 3 airway, no stridor  Respiratory - equal breath sounds bilaterally, no wheezing or rales  CV - s1s2 regular rate and rhythm, no murmurs  Ext - no clubbing, no edema  Skin - no rashes  Psych - normal mood and affect   Assessment/Plan:   Allergic rhinitis. - continue zyrtec, neti pot - add montelukast  Chronic bronchitis with mild emphysema. - no significant obstruction on recent PFT - increase symbicort to 160/45 two puffs bid - intolerant of LAMA's due to ophthalmologic side effects - don't think she needs prednisone or ABx at this time  ILD with NSIP pattern. - very mild findings on CT imaging, and no significant restriction or diffusion defect on recent PFT - monitor clinically  Obstructive sleep apnea. - never tried on  CPAP - re-assess at next visit  Obesity. - discussed importance of weight loss  A total of  34 minutes spent addressing patient care issues on day of visit.   Follow up:  Patient Instructions  Change symbicort to 160/45 two puffs twice per day  Montelukast (singulair) 10 mg pill nightly  Follow up in 3 months   Signature:  Chesley Mires, MD [place] Pager: [phone] 12/08/2019, 9:29 AM  Flow Sheet     Pulmonary tests:  PFT 09/19/19 >> FEV1 1.82 (75%), FEV1% 76, TLC 5.02 (92%), DLCO 98%, No BD  Chest imaging:  HRCT chest 07/14/19 >> atherosclerosis, mild centrilobular and paraseptal emphysema, minimal peripheral and subpleural GGO, scarring RLL, scattered peripheral nodules up to 4 mm (benign)  Sleep tests:  HST 09/10/19 >> AHI 11.8, SpO2 low 67%  Medications:   Allergies as of 12/08/2019      Reactions   Chloroxylenol (antiseptic) Rash   Amoxicillin-pot Clavulanate Diarrhea   Pt had bad diarrhea. Note: Can take cephalexin without any problems.   Ciprofloxacin Hcl Hives   Codeine Swelling   Swollen lips.  Pt has taken vicoden w/o problems   Statins    Increased LFTs- pt currently tolerating low dose Lavalo   Sulfa Antibiotics    Advil [ibuprofen] Rash   Nsaids Swelling, Rash   Rash and itching.   Tolmetin Rash, Swelling   Rash and itching.      Medication  List       Accurate as of December 08, 2019  9:29 AM. If you have any questions, ask your nurse or doctor.        STOP taking these medications   Symbicort 80-4.5 MCG/ACT inhaler Generic drug: budesonide-formoterol Replaced by: budesonide-formoterol 160-4.5 MCG/ACT inhaler Stopped by: Chesley Mires, MD     TAKE these medications   albuterol 108 (90 Base) MCG/ACT inhaler Commonly known as: ProAir HFA Inhale 1 puff into the lungs every 6 (six) hours as needed for wheezing or shortness of breath.   thyroid 30 MG tablet Commonly known as: ARMOUR Take 30 mg by mouth daily before  breakfast.   Armour Thyroid 120 MG tablet Generic drug: thyroid Take 1 tablet by mouth daily. Along with Thyroid 30mg  for total of 150mg  once a day   atorvastatin 20 MG tablet Commonly known as: LIPITOR TAKE 1 TABLET(20 MG) BY MOUTH DAILY   budesonide-formoterol 160-4.5 MCG/ACT inhaler Commonly known as: SYMBICORT Inhale 2 puffs into the lungs 2 (two) times daily. Replaces: Symbicort 80-4.5 MCG/ACT inhaler Started by: Chesley Mires, MD   calcium carbonate 500 MG chewable tablet Commonly known as: TUMS - dosed in mg elemental calcium Chew 2 tablets by mouth at bedtime as needed for indigestion or heartburn.   cetirizine 10 MG tablet Commonly known as: ZYRTEC Take 10 mg by mouth daily.   furosemide 80 MG tablet Commonly known as: LASIX TAKE 1 TABLET(80 MG) BY MOUTH DAILY   Hypromellose 0.3 % Soln Place 1 drop into both eyes at bedtime.   losartan 50 MG tablet Commonly known as: COZAAR Take 1 tablet (50 mg total) by mouth 2 (two) times daily.   Medical Compression Stockings Misc 1 application by Does not apply route daily.   montelukast 10 MG tablet Commonly known as: SINGULAIR Take 1 tablet (10 mg total) by mouth at bedtime. Started by: Chesley Mires, MD   multivitamin tablet Take 1 tablet by mouth daily.   nystatin powder Commonly known as: MYCOSTATIN/NYSTOP Apply 1 application topically 3 (three) times daily. Apply to affected area for up to 7 days as needed.   Omega 3 1200 MG Caps Take 1 capsule by mouth 2 (two) times daily.   pregabalin 100 MG capsule Commonly known as: LYRICA TAKE 1 CAPSULE(100 MG) BY MOUTH THREE TIMES DAILY   traMADol 50 MG tablet Commonly known as: ULTRAM TAKE 1 TABLET(50 MG) BY MOUTH THREE TIMES DAILY AS NEEDED FOR MODERATE TO SEVERE PAIN   venlafaxine 50 MG tablet Commonly known as: EFFEXOR Take 1 tab three times a day   Vitamin D-3 125 MCG (5000 UT) Tabs Take 1 tablet by mouth daily.       Past Surgical History:  She  has a  past surgical history that includes Dilation and curettage of uterus; Back surgery; Tonsillectomy; Wisdom tooth extraction; Right colectomy (2008); colonscopy (12/09/11); Hysteroscopy with D & C (05/29/2011); Urethrotomy (1984); Hysteroscopy; Polypectomy; Excisional hemorrhoidectomy; Cholecystectomy; Laparoscopic cholecystectomy; Dilatation & currettage/hysteroscopy with resectoscope (N/A, 03/03/2013); Robotic assisted total hysterectomy with bilateral salpingo oophorectomy (Bilateral, 04/01/2013); Colectomy; and Breast lumpectomy with radioactive seed localization (Left, 04/27/2015).  Family History:  Her family history includes Breast cancer in her maternal grandmother; Breast cancer (age of onset: 91) in her maternal aunt and sister; Breast cancer (age of onset: 85) in her mother; Colon cancer in her maternal aunt; Diabetes in her brother and mother; Heart failure in her father; Hypertension in her brother and sister; Thyroid disease in her mother.  Social  History:  She  reports that she quit smoking about 6 years ago. Her smoking use included cigarettes. She has a 42.00 pack-year smoking history. She has never used smokeless tobacco. She reports that she does not drink alcohol or use drugs.

## 2019-12-08 NOTE — Patient Instructions (Signed)
Change symbicort to 160/45 two puffs twice per day  Montelukast (singulair) 10 mg pill nightly  Follow up in 3 months

## 2019-12-15 DIAGNOSIS — Z8639 Personal history of other endocrine, nutritional and metabolic disease: Secondary | ICD-10-CM | POA: Diagnosis not present

## 2019-12-15 DIAGNOSIS — E039 Hypothyroidism, unspecified: Secondary | ICD-10-CM | POA: Diagnosis not present

## 2019-12-15 DIAGNOSIS — Z6841 Body Mass Index (BMI) 40.0 and over, adult: Secondary | ICD-10-CM | POA: Diagnosis not present

## 2019-12-15 DIAGNOSIS — R5382 Chronic fatigue, unspecified: Secondary | ICD-10-CM | POA: Diagnosis not present

## 2019-12-15 DIAGNOSIS — E559 Vitamin D deficiency, unspecified: Secondary | ICD-10-CM | POA: Diagnosis not present

## 2019-12-17 ENCOUNTER — Other Ambulatory Visit: Payer: Self-pay

## 2019-12-18 ENCOUNTER — Other Ambulatory Visit (HOSPITAL_COMMUNITY)
Admission: RE | Admit: 2019-12-18 | Discharge: 2019-12-18 | Disposition: A | Discharge status: Home / Self Care | Payer: Medicare Other | Source: Ambulatory Visit | Attending: Obstetrics and Gynecology | Admitting: Obstetrics and Gynecology

## 2019-12-18 ENCOUNTER — Ambulatory Visit (INDEPENDENT_AMBULATORY_CARE_PROVIDER_SITE_OTHER): Payer: Medicare Other | Admitting: Obstetrics and Gynecology

## 2019-12-18 ENCOUNTER — Ambulatory Visit (INDEPENDENT_AMBULATORY_CARE_PROVIDER_SITE_OTHER): Payer: Medicare Other

## 2019-12-18 ENCOUNTER — Encounter: Payer: Self-pay | Admitting: Obstetrics and Gynecology

## 2019-12-18 VITALS — BP 122/70 | HR 80 | Temp 98.0°F | Ht 67.0 in | Wt 295.4 lb

## 2019-12-18 DIAGNOSIS — R8290 Unspecified abnormal findings in urine: Secondary | ICD-10-CM

## 2019-12-18 DIAGNOSIS — N842 Polyp of vagina: Secondary | ICD-10-CM

## 2019-12-18 DIAGNOSIS — R319 Hematuria, unspecified: Secondary | ICD-10-CM

## 2019-12-18 DIAGNOSIS — N760 Acute vaginitis: Secondary | ICD-10-CM | POA: Diagnosis not present

## 2019-12-18 DIAGNOSIS — N949 Unspecified condition associated with female genital organs and menstrual cycle: Secondary | ICD-10-CM

## 2019-12-18 DIAGNOSIS — N83202 Unspecified ovarian cyst, left side: Secondary | ICD-10-CM | POA: Diagnosis not present

## 2019-12-18 LAB — POCT URINALYSIS DIPSTICK
Bilirubin, UA: NEGATIVE
Blood, UA: NEGATIVE
Glucose, UA: NEGATIVE
Ketones, UA: NEGATIVE
Nitrite, UA: NEGATIVE
Protein, UA: NEGATIVE
Urobilinogen, UA: 0.2 E.U./dL
pH, UA: 5 (ref 5.0–8.0)
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
## 2019-12-18 NOTE — Progress Notes (Signed)
Encounter reviewed by Dr. Duwane Gewirtz Amundson C. Silva.  

## 2019-12-19 ENCOUNTER — Other Ambulatory Visit: Payer: Self-pay

## 2019-12-19 LAB — URINALYSIS, MICROSCOPIC ONLY
Bacteria, UA: NONE SEEN
Casts: NONE SEEN /lpf
RBC, Urine: NONE SEEN /hpf (ref 0–2)

## 2019-12-19 LAB — CA 125: Cancer Antigen (CA) 125: 11.7 U/mL (ref 0.0–38.1)

## 2019-12-20 LAB — URINE CULTURE

## 2019-12-22 ENCOUNTER — Telehealth: Payer: Self-pay | Admitting: *Deleted

## 2019-12-22 ENCOUNTER — Other Ambulatory Visit: Payer: Self-pay | Admitting: *Deleted

## 2019-12-22 LAB — SURGICAL PATHOLOGY

## 2019-12-22 MED ORDER — CEPHALEXIN 500 MG PO CAPS: 500.0000 mg | ORAL_CAPSULE | Freq: Two times a day (BID) | ORAL | 0 refills | Status: AC

## 2019-12-22 NOTE — Telephone Encounter (Addendum)
Shelly Logan, RN  12/22/2019 10:39 AM EDT    Patient notified of results. See telephone encounter dated 12/22/19 to review RX with provider.    Nunzio Cobbs, MD  12/22/2019 7:36 AM EDT    This is a second result note for this patient.   She needs Keflex instead of Macrobid.  Macrobid will not treat the infection adequately according to sensitivities.   Please send in Keflex 500 mg po bid x 7 days.    Nunzio Cobbs, MD  12/22/2019 7:32 AM EDT    Please contact patient with results of UC showing Klebsiella infection.  I recommend Macrobid 100 mg po bid x 5 days.  Please send in to her pharmacy.

## 2019-12-22 NOTE — Telephone Encounter (Signed)
Contraindication to Keflex Chloroxlenol: rash Amoxicillin -pot clavulanate: diarrhea  Patient states she thinks she has taken Keflex in the past with no problems, is unsure. Advised patient I will review with Dr. Quincy Simmonds and f/u, patient agreeable.   Per review of Epic, patient was prescribed Keflex on 08/16/19 at Urgent Care.  Rx pended. Routing to Dr. Quincy Simmonds to review.

## 2019-12-22 NOTE — Telephone Encounter (Signed)
[place] for Keflex for her UTI.  Chart indicates she has taken this without problems.

## 2019-12-22 NOTE — Telephone Encounter (Signed)
Spoke with patient. Advised per Dr. Quincy Simmonds. Rx for keflex to verified pharmacy. Patient verbalizes understanding and is agreeable.    Encounter closed.

## 2019-12-23 ENCOUNTER — Encounter: Payer: Self-pay | Admitting: Family Medicine

## 2019-12-24 ENCOUNTER — Telehealth: Payer: Self-pay | Admitting: *Deleted

## 2019-12-24 NOTE — Telephone Encounter (Signed)
-----   Message from Nunzio Cobbs, MD sent at 12/24/2019  9:06 AM EDT ----- Please contact patient with results of vaginal biopsy showing a benign polyp.  No abnormal cells were seen.  I will see her back in 3 months for her next pelvic ultrasound.

## 2019-12-24 NOTE — Telephone Encounter (Signed)
Burnice Logan, RN  12/24/2019 9:55 AM EDT    Left message to call Sharee Pimple, RN at [facility].

## 2019-12-25 NOTE — Telephone Encounter (Signed)
Spoke with patient. Advised as seen below per Dr. Quincy Simmonds.  PUS scheduled for 03/25/20 at 8:30am, consult to follow at 9am with Dr. Quincy Simmonds.  AEX scheduled for 01/12/21 at 9am.   Patient verbalizes understanding and is agreeable.   Encounter closed.

## 2019-12-25 NOTE — Telephone Encounter (Signed)
Patient returned a call to [place].

## 2020-01-20 ENCOUNTER — Telehealth: Payer: Self-pay | Admitting: Obstetrics and Gynecology

## 2020-01-20 NOTE — Telephone Encounter (Signed)
Call to patient. Per DPR, OK to leave message on voicemail.   Left voicemail requesting a return call to [place] to review benefits for scheduled Pelvic ultrasound with Brook A. Silva, MD, FACOG. 

## 2020-01-22 NOTE — Telephone Encounter (Signed)
Spoke with patient regarding benefits for recommended ultrasound. Patient is aware that ultrasound is transvaginal. Patient acknowledges understanding of information presented. Patient is aware of cancellation policy. Encounter closed. °

## 2020-01-26 DIAGNOSIS — K760 Fatty (change of) liver, not elsewhere classified: Secondary | ICD-10-CM | POA: Diagnosis not present

## 2020-01-26 DIAGNOSIS — R7401 Elevation of levels of liver transaminase levels: Secondary | ICD-10-CM | POA: Diagnosis not present

## 2020-01-26 DIAGNOSIS — R945 Abnormal results of liver function studies: Secondary | ICD-10-CM | POA: Diagnosis not present

## 2020-01-26 DIAGNOSIS — Z86010 Personal history of colonic polyps: Secondary | ICD-10-CM | POA: Diagnosis not present

## 2020-01-27 ENCOUNTER — Telehealth: Payer: Self-pay | Admitting: Family Medicine

## 2020-01-27 DIAGNOSIS — G8929 Other chronic pain: Secondary | ICD-10-CM

## 2020-01-27 NOTE — Telephone Encounter (Signed)
Patient states that her knees has given out & she wants a referral to an Orthopedic specialist. She states that she feels like she has just worn them out being overweight and old.  Please advise if she needs to be seen before referral can be sent.

## 2020-01-28 NOTE — Telephone Encounter (Signed)
Left message to return call 

## 2020-01-29 NOTE — Addendum Note (Signed)
Addended by: Lamar Blinks C on: 01/29/2020 12:20 PM   Modules accepted: Orders

## 2020-01-29 NOTE — Telephone Encounter (Addendum)
Patient called back-She states it is bilateral knee pain, however her left is significantly worse than the right at the moment. She is able to walk with her right leg leading but she can not bear any weight on her left knee. No injury that she knows of-shes worked on her feet most of her life and states this is probably due to being over weight as well.   She does not have a preferred Doctor or office that she knows of but she would like somewhere in [place]. She does not wish to drive all the way to [place] if possible.   Looked at ortho close to [place] looks like [facility] be her best bet?  [address] [place], [place], [place] [address]

## 2020-03-08 ENCOUNTER — Other Ambulatory Visit: Payer: Self-pay

## 2020-03-08 ENCOUNTER — Ambulatory Visit (INDEPENDENT_AMBULATORY_CARE_PROVIDER_SITE_OTHER): Payer: Medicare Other | Admitting: Pulmonary Disease

## 2020-03-08 ENCOUNTER — Encounter: Payer: Self-pay | Admitting: Pulmonary Disease

## 2020-03-08 VITALS — BP 126/68 | HR 66 | Temp 98.4°F | Ht 67.0 in | Wt 292.0 lb

## 2020-03-08 DIAGNOSIS — J432 Centrilobular emphysema: Secondary | ICD-10-CM | POA: Diagnosis not present

## 2020-03-08 DIAGNOSIS — J301 Allergic rhinitis due to pollen: Secondary | ICD-10-CM

## 2020-03-08 DIAGNOSIS — Z6841 Body Mass Index (BMI) 40.0 and over, adult: Secondary | ICD-10-CM | POA: Diagnosis not present

## 2020-03-08 DIAGNOSIS — R768 Other specified abnormal immunological findings in serum: Secondary | ICD-10-CM | POA: Diagnosis not present

## 2020-03-08 DIAGNOSIS — J410 Simple chronic bronchitis: Secondary | ICD-10-CM | POA: Diagnosis not present

## 2020-03-08 DIAGNOSIS — J849 Interstitial pulmonary disease, unspecified: Secondary | ICD-10-CM | POA: Diagnosis not present

## 2020-03-08 NOTE — Progress Notes (Signed)
[facility]  Chief Complaint  Patient presents with  . Follow-up    Sob-same,cough in am with hoarseness    Constitutional:  BP 126/68 (BP Location: Right Arm, Cuff Size: Large)   Pulse 66   Temp 98.4 F (36.9 C) (Oral)   Ht 5\' 7"  (1.702 m)   Wt 292 lb (132.5 kg)   LMP 11/20/2011 (Approximate)   SpO2 94%   BMI 45.73 kg/m   Past Medical History:  Anxiety, Depression, OA, Back pain, Endometrial cancer, Fibromyalgia, GERD, HLD, HTN, Graves disease, Hypothyroidism, IBS, Neuropathy, Fatty liver  Summary:  Shelly Mosley is a 74 y.o. female former smoker with emphysema, ILD, chronic bronchitis, OSA.  Subjective:   Recently seen by GI for elevated LFTs.  ANA was positive 1:640 with homogeneous pattern.  She is scheduled to have liver biopsy.    She has dry eyes.  Was seen by ophthalmology and was told her assessment for Sjogren's was negative.  Denies problem with dysphagia, skin rash, joint swelling.  She got GI symptoms from singulair.  Improved once she stopped this.  Sinuses okay.  Not having cough, wheeze, sputum.  Using symbicort daily.  Feels her sleep is okay.   Physical Exam:   Appearance - well kempt   ENMT - no sinus tenderness, no oral exudate, no LAN, Mallampati 3 airway, no stridor  Respiratory - equal breath sounds bilaterally, no wheezing or rales  CV - s1s2 regular rate and rhythm, no murmurs  Ext - no clubbing, no edema  Skin - no rashes  Psych - normal mood and affect   Assessment/Plan:   ILD with NSIP pattern with positive ANA. - recent CT chest had mild changes, PFT was relatively unremarkable, and minimal symptoms - will plan to repeat HRCT chest and PFT in January 2022  Chronic bronchitis with mild emphysema. - no significant obstruction on recent PFT - continue symbicort daily - LAMA's caused ophthalmologic side effects, singulair caused GI side effects  Allergic rhinitis. - prn zyrtec, neti  pot  Obstructive sleep apnea. - never tried on CPAP - feels sleep is okay - asked her to monitor her sleep pattern  Obesity. - discussed importance of weight loss  Elevated LFTs. - she will have liver biopsy done with GI from [facility] in [place]  A total of  37 minutes spent addressing patient care issues on day of visit.    Follow up:  Patient Instructions  Will schedule high resolution CT chest and pulmonary function test for January 2022 and follow up after these are done   Signature:  Chesley Mires, MD [place] Pager: [phone] 03/08/2020, 9:52 AM  Flow Sheet     Pulmonary tests:   PFT 09/19/19 >> FEV1 1.82 (75%), FEV1% 76, TLC 5.02 (92%), DLCO 98%, No BD  Serology 01/26/20 >> ANA positive 1:640 homogenous pattern  A1AT 01/26/20 >> 192, MM  Chest imaging:   HRCT chest 07/14/19 >> atherosclerosis, mild centrilobular and paraseptal emphysema, minimal peripheral and subpleural GGO, scarring RLL, scattered peripheral nodules up to 4 mm (benign)  Sleep tests:   HST 09/10/19 >> AHI 11.8, SpO2 low 67%  Medications:   Allergies as of 03/08/2020      Reactions   Chloroxylenol (antiseptic) Rash   Amoxicillin-pot Clavulanate Diarrhea   Pt had bad diarrhea. Note: Can take cephalexin without any problems.   Ciprofloxacin Hcl Hives   Codeine Swelling   Swollen lips.  Pt has taken vicoden w/o problems  Statins    Increased LFTs- pt currently tolerating low dose Lavalo   Sulfa Antibiotics    Advil [ibuprofen] Rash   Nsaids Swelling, Rash   Rash and itching.   Tolmetin Rash, Swelling   Rash and itching.      Medication List       Accurate as of March 08, 2020  9:52 AM. If you have any questions, ask your nurse or doctor.        albuterol 108 (90 Base) MCG/ACT inhaler Commonly known as: ProAir HFA Inhale 1 puff into the lungs every 6 (six) hours as needed for wheezing or shortness of breath.   thyroid 30 MG tablet Commonly known as:  ARMOUR Take 30 mg by mouth daily before breakfast.   Armour Thyroid 120 MG tablet Generic drug: thyroid Take 1 tablet by mouth daily. Along with Thyroid 30mg  for total of 150mg  once a day   atorvastatin 20 MG tablet Commonly known as: LIPITOR TAKE 1 TABLET(20 MG) BY MOUTH DAILY   budesonide-formoterol 160-4.5 MCG/ACT inhaler Commonly known as: SYMBICORT Inhale 2 puffs into the lungs 2 (two) times daily.   calcium carbonate 500 MG chewable tablet Commonly known as: TUMS - dosed in mg elemental calcium Chew 2 tablets by mouth at bedtime as needed for indigestion or heartburn.   cetirizine 10 MG tablet Commonly known as: ZYRTEC Take 10 mg by mouth daily.   furosemide 80 MG tablet Commonly known as: LASIX TAKE 1 TABLET(80 MG) BY MOUTH DAILY   Hypromellose 0.3 % Soln Place 1 drop into both eyes at bedtime.   losartan 50 MG tablet Commonly known as: COZAAR Take 1 tablet (50 mg total) by mouth 2 (two) times daily.   Medical Compression Stockings Misc 1 application by Does not apply route daily.   montelukast 10 MG tablet Commonly known as: SINGULAIR Take 1 tablet (10 mg total) by mouth at bedtime.   multivitamin tablet Take 1 tablet by mouth daily.   nystatin powder Commonly known as: MYCOSTATIN/NYSTOP Apply 1 application topically 3 (three) times daily. Apply to affected area for up to 7 days as needed.   Omega 3 1200 MG Caps Take 1 capsule by mouth 2 (two) times daily.   pregabalin 100 MG capsule Commonly known as: LYRICA TAKE 1 CAPSULE(100 MG) BY MOUTH THREE TIMES DAILY   traMADol 50 MG tablet Commonly known as: ULTRAM TAKE 1 TABLET(50 MG) BY MOUTH THREE TIMES DAILY AS NEEDED FOR MODERATE TO SEVERE PAIN   venlafaxine 50 MG tablet Commonly known as: EFFEXOR Take 1 tab three times a day   Vitamin D-3 125 MCG (5000 UT) Tabs Take 1 tablet by mouth daily.       Past Surgical History:  She  has a past surgical history that includes Dilation and curettage of  uterus; Back surgery; Tonsillectomy; Wisdom tooth extraction; Right colectomy (2008); colonscopy (12/09/11); Hysteroscopy with D & C (05/29/2011); Urethrotomy (1984); Hysteroscopy; Polypectomy; Excisional hemorrhoidectomy; Cholecystectomy; Laparoscopic cholecystectomy; Dilatation & currettage/hysteroscopy with resectoscope (N/A, 03/03/2013); Robotic assisted total hysterectomy with bilateral salpingo oophorectomy (Bilateral, 04/01/2013); Colectomy; and Breast lumpectomy with radioactive seed localization (Left, 04/27/2015).  Family History:  Her family history includes Breast cancer in her maternal grandmother; Breast cancer (age of onset: 63) in her maternal aunt and sister; Breast cancer (age of onset: 25) in her mother; Colon cancer in her maternal aunt; Diabetes in her brother and mother; Heart failure in her father; Hypertension in her brother and sister; Thyroid disease in her mother.  Social  History:  She  reports that she quit smoking about 7 years ago. Her smoking use included cigarettes. She has a 42.00 pack-year smoking history. She has never used smokeless tobacco. She reports that she does not drink alcohol and does not use drugs.

## 2020-03-08 NOTE — Patient Instructions (Signed)
Will schedule high resolution CT chest and pulmonary function test for January 2022 and follow up after these are done

## 2020-03-17 ENCOUNTER — Other Ambulatory Visit: Payer: Self-pay

## 2020-03-17 DIAGNOSIS — G6289 Other specified polyneuropathies: Secondary | ICD-10-CM

## 2020-03-17 MED ORDER — TRAMADOL HCL 50 MG PO TABS: ORAL_TABLET | ORAL | 2 refills | Status: DC

## 2020-03-17 NOTE — Telephone Encounter (Signed)
Requesting:Tramadol Contract: none CVU:DTHY Last Visit: 10/23/2019 Next Visit: 04/01/2020 Last Refill: 10/23/2019  Please Advise

## 2020-03-25 ENCOUNTER — Ambulatory Visit (INDEPENDENT_AMBULATORY_CARE_PROVIDER_SITE_OTHER): Payer: Medicare Other | Admitting: Obstetrics and Gynecology

## 2020-03-25 ENCOUNTER — Other Ambulatory Visit: Payer: Self-pay

## 2020-03-25 ENCOUNTER — Encounter: Payer: Self-pay | Admitting: Obstetrics and Gynecology

## 2020-03-25 ENCOUNTER — Ambulatory Visit (INDEPENDENT_AMBULATORY_CARE_PROVIDER_SITE_OTHER): Payer: Medicare Other

## 2020-03-25 VITALS — BP 118/70 | HR 80 | Resp 14 | Ht 67.0 in | Wt 289.2 lb

## 2020-03-25 DIAGNOSIS — N949 Unspecified condition associated with female genital organs and menstrual cycle: Secondary | ICD-10-CM

## 2020-03-25 NOTE — Progress Notes (Signed)
Encounter reviewed by Dr. Antonea Gaut Amundson C. Silva.  
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
## 2020-03-29 DIAGNOSIS — I10 Essential (primary) hypertension: Secondary | ICD-10-CM | POA: Diagnosis not present

## 2020-03-29 DIAGNOSIS — K648 Other hemorrhoids: Secondary | ICD-10-CM | POA: Diagnosis not present

## 2020-03-29 DIAGNOSIS — E785 Hyperlipidemia, unspecified: Secondary | ICD-10-CM | POA: Diagnosis not present

## 2020-03-29 DIAGNOSIS — K621 Rectal polyp: Secondary | ICD-10-CM | POA: Diagnosis not present

## 2020-03-29 DIAGNOSIS — Z86010 Personal history of colonic polyps: Secondary | ICD-10-CM | POA: Diagnosis not present

## 2020-03-29 DIAGNOSIS — Z980 Intestinal bypass and anastomosis status: Secondary | ICD-10-CM | POA: Diagnosis not present

## 2020-03-29 DIAGNOSIS — Z9049 Acquired absence of other specified parts of digestive tract: Secondary | ICD-10-CM | POA: Diagnosis not present

## 2020-03-29 DIAGNOSIS — D120 Benign neoplasm of cecum: Secondary | ICD-10-CM | POA: Diagnosis not present

## 2020-03-29 DIAGNOSIS — K760 Fatty (change of) liver, not elsewhere classified: Secondary | ICD-10-CM | POA: Diagnosis not present

## 2020-03-29 DIAGNOSIS — Z87891 Personal history of nicotine dependence: Secondary | ICD-10-CM | POA: Diagnosis not present

## 2020-03-29 DIAGNOSIS — K5730 Diverticulosis of large intestine without perforation or abscess without bleeding: Secondary | ICD-10-CM | POA: Diagnosis not present

## 2020-03-29 DIAGNOSIS — Z1211 Encounter for screening for malignant neoplasm of colon: Secondary | ICD-10-CM | POA: Diagnosis not present

## 2020-03-29 DIAGNOSIS — K635 Polyp of colon: Secondary | ICD-10-CM | POA: Diagnosis not present
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
## 2020-04-01 ENCOUNTER — Ambulatory Visit: Payer: Medicare Other | Admitting: Family Medicine

## 2020-04-08 ENCOUNTER — Other Ambulatory Visit: Payer: Self-pay | Admitting: Family Medicine

## 2020-04-08 DIAGNOSIS — G6289 Other specified polyneuropathies: Secondary | ICD-10-CM

## 2020-04-08 NOTE — Telephone Encounter (Signed)
Medication: pregabalin (LYRICA) 100 MG capsule [[id number]]   Has the patient contacted their pharmacy? No. (If no, request that the patient contact the pharmacy for the refill.) (If yes, when and what did the pharmacy advise?)  Preferred Pharmacy (with phone number or street name): [id number] - [place], [place] - [address] AT [address]  [address], [place] [address]  Phone:  [phone] Fax:  [phone]  DEA #:  [id number]  Agent: Please be advised that RX refills may take up to 3 business days. We ask that you follow-up with your pharmacy.

## 2020-04-14 DIAGNOSIS — K8309 Other cholangitis: Secondary | ICD-10-CM | POA: Diagnosis not present

## 2020-04-14 DIAGNOSIS — K739 Chronic hepatitis, unspecified: Secondary | ICD-10-CM | POA: Diagnosis not present

## 2020-04-14 DIAGNOSIS — R945 Abnormal results of liver function studies: Secondary | ICD-10-CM | POA: Diagnosis not present

## 2020-04-14 DIAGNOSIS — K760 Fatty (change of) liver, not elsewhere classified: Secondary | ICD-10-CM | POA: Diagnosis not present

## 2020-04-14 DIAGNOSIS — R7989 Other specified abnormal findings of blood chemistry: Secondary | ICD-10-CM | POA: Diagnosis not present

## 2020-04-21 DIAGNOSIS — H43822 Vitreomacular adhesion, left eye: Secondary | ICD-10-CM | POA: Diagnosis not present

## 2020-04-21 DIAGNOSIS — H43813 Vitreous degeneration, bilateral: Secondary | ICD-10-CM | POA: Diagnosis not present

## 2020-04-21 DIAGNOSIS — H35342 Macular cyst, hole, or pseudohole, left eye: Secondary | ICD-10-CM | POA: Diagnosis not present
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
## 2020-05-01 NOTE — Patient Instructions (Addendum)
It was great to see you again today, I will be in touch with your labs as soon as possible Please plan for a COVID-19 booster 6 to 8 months after your second vaccine  Please get your shingles vaccine- shingrix- at your convenience through your pharmacy   Let's think about weight loss surgery for you.  I made a referral to general surgery for you- please sign up for the online seminar prior to your appt   Please see me in about 6 months and take care

## 2020-05-03 ENCOUNTER — Other Ambulatory Visit: Payer: Self-pay

## 2020-05-03 ENCOUNTER — Ambulatory Visit (INDEPENDENT_AMBULATORY_CARE_PROVIDER_SITE_OTHER): Payer: Medicare Other | Admitting: Family Medicine

## 2020-05-03 ENCOUNTER — Encounter: Payer: Self-pay | Admitting: Family Medicine

## 2020-05-03 VITALS — BP 110/70 | HR 78 | Resp 17 | Ht 67.0 in | Wt 293.0 lb

## 2020-05-03 DIAGNOSIS — Z23 Encounter for immunization: Secondary | ICD-10-CM

## 2020-05-03 DIAGNOSIS — R7989 Other specified abnormal findings of blood chemistry: Secondary | ICD-10-CM

## 2020-05-03 DIAGNOSIS — I10 Essential (primary) hypertension: Secondary | ICD-10-CM

## 2020-05-03 DIAGNOSIS — M25561 Pain in right knee: Secondary | ICD-10-CM

## 2020-05-03 DIAGNOSIS — E782 Mixed hyperlipidemia: Secondary | ICD-10-CM

## 2020-05-03 DIAGNOSIS — M25562 Pain in left knee: Secondary | ICD-10-CM | POA: Diagnosis not present

## 2020-05-03 DIAGNOSIS — I890 Lymphedema, not elsewhere classified: Secondary | ICD-10-CM

## 2020-05-03 DIAGNOSIS — G8929 Other chronic pain: Secondary | ICD-10-CM

## 2020-05-04 LAB — COMPREHENSIVE METABOLIC PANEL
AG Ratio: 1.6 (calc) (ref 1.0–2.5)
ALT: 51 U/L — ABNORMAL HIGH (ref 6–29)
AST: 43 U/L — ABNORMAL HIGH (ref 10–35)
Albumin: 4.2 g/dL (ref 3.6–5.1)
Alkaline phosphatase (APISO): 195 U/L — ABNORMAL HIGH (ref 37–153)
BUN: 10 mg/dL (ref 7–25)
CO2: 34 mmol/L — ABNORMAL HIGH (ref 20–32)
Calcium: 9.4 mg/dL (ref 8.6–10.4)
Chloride: 97 mmol/L — ABNORMAL LOW (ref 98–110)
Creat: 0.79 mg/dL (ref 0.60–0.93)
Globulin: 2.7 g/dL (calc) (ref 1.9–3.7)
Glucose, Bld: 95 mg/dL (ref 65–99)
Potassium: 4.6 mmol/L (ref 3.5–5.3)
Sodium: 141 mmol/L (ref 135–146)
Total Bilirubin: 0.5 mg/dL (ref 0.2–1.2)
Total Protein: 6.9 g/dL (ref 6.1–8.1)

## 2020-05-04 LAB — CBC
HCT: 39.7 % (ref 35.0–45.0)
Hemoglobin: 13.1 g/dL (ref 11.7–15.5)
MCH: 31.7 pg (ref 27.0–33.0)
MCHC: 33 g/dL (ref 32.0–36.0)
MCV: 96.1 fL (ref 80.0–100.0)
MPV: 10.9 fL (ref 7.5–12.5)
Platelets: 267 10*3/uL (ref 140–400)
RBC: 4.13 10*6/uL (ref 3.80–5.10)
RDW: 12.4 % (ref 11.0–15.0)
WBC: 7 10*3/uL (ref 3.8–10.8)

## 2020-05-04 LAB — LIPID PANEL
Cholesterol: 152 mg/dL (ref <200)
HDL: 55 mg/dL (ref >=50)
LDL Cholesterol (Calc): 79 mg/dL (calc)
Non-HDL Cholesterol (Calc): 97 mg/dL (calc) (ref <130)
Total CHOL/HDL Ratio: 2.8 (calc) (ref <5.0)
Triglycerides: 97 mg/dL (ref <150)

## 2020-05-07 ENCOUNTER — Other Ambulatory Visit: Payer: Self-pay | Admitting: Family Medicine

## 2020-05-07 ENCOUNTER — Other Ambulatory Visit: Payer: Self-pay | Admitting: *Deleted

## 2020-05-07 DIAGNOSIS — I10 Essential (primary) hypertension: Secondary | ICD-10-CM

## 2020-05-07 DIAGNOSIS — Z1239 Encounter for other screening for malignant neoplasm of breast: Secondary | ICD-10-CM
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
## 2020-05-10 ENCOUNTER — Inpatient Hospital Stay (HOSPITAL_BASED_OUTPATIENT_CLINIC_OR_DEPARTMENT_OTHER): Payer: Medicare Other | Admitting: Oncology

## 2020-05-10 ENCOUNTER — Telehealth: Payer: Self-pay | Admitting: Oncology

## 2020-05-10 ENCOUNTER — Other Ambulatory Visit: Payer: Self-pay

## 2020-05-10 ENCOUNTER — Inpatient Hospital Stay: Payer: Medicare Other | Attending: Oncology

## 2020-05-10 VITALS — BP 128/69 | Temp 97.6°F | Resp 18 | Ht 67.0 in | Wt 290.6 lb

## 2020-05-10 DIAGNOSIS — Z1239 Encounter for other screening for malignant neoplasm of breast: Secondary | ICD-10-CM | POA: Diagnosis not present

## 2020-05-10 DIAGNOSIS — J42 Unspecified chronic bronchitis: Secondary | ICD-10-CM

## 2020-05-10 DIAGNOSIS — C541 Malignant neoplasm of endometrium: Secondary | ICD-10-CM | POA: Diagnosis not present

## 2020-05-10 DIAGNOSIS — Z803 Family history of malignant neoplasm of breast: Secondary | ICD-10-CM | POA: Insufficient documentation

## 2020-05-10 DIAGNOSIS — Z8542 Personal history of malignant neoplasm of other parts of uterus: Secondary | ICD-10-CM | POA: Diagnosis not present

## 2020-05-10 DIAGNOSIS — Z08 Encounter for follow-up examination after completed treatment for malignant neoplasm: Secondary | ICD-10-CM | POA: Diagnosis not present

## 2020-05-10 LAB — CMP (CANCER CENTER ONLY)
ALT: 49 U/L — ABNORMAL HIGH (ref 0–44)
AST: 44 U/L — ABNORMAL HIGH (ref 15–41)
Albumin: 3.5 g/dL (ref 3.5–5.0)
Alkaline Phosphatase: 184 U/L — ABNORMAL HIGH (ref 38–126)
Anion gap: 10 (ref 5–15)
BUN: 12 mg/dL (ref 8–23)
CO2: 33 mmol/L — ABNORMAL HIGH (ref 22–32)
Calcium: 9.7 mg/dL (ref 8.9–10.3)
Chloride: 98 mmol/L (ref 98–111)
Creatinine: 0.77 mg/dL (ref 0.44–1.00)
GFR, Est AFR Am: >60 mL/min (ref >60)
GFR, Estimated: >60 mL/min (ref >60)
Glucose, Bld: 94 mg/dL (ref 70–99)
Potassium: 3.7 mmol/L (ref 3.5–5.1)
Sodium: 141 mmol/L (ref 135–145)
Total Bilirubin: 0.5 mg/dL (ref 0.3–1.2)
Total Protein: 7.4 g/dL (ref 6.5–8.1)

## 2020-05-10 LAB — CBC WITH DIFFERENTIAL (CANCER CENTER ONLY)
Abs Immature Granulocytes: 0.04 10*3/uL (ref 0.00–0.07)
Basophils Absolute: 0 10*3/uL (ref 0.0–0.1)
Basophils Relative: 0 %
Eosinophils Absolute: 0.1 10*3/uL (ref 0.0–0.5)
Eosinophils Relative: 2 %
HCT: 39.3 % (ref 36.0–46.0)
Hemoglobin: 12.4 g/dL (ref 12.0–15.0)
Immature Granulocytes: 1 %
Lymphocytes Relative: 21 %
Lymphs Abs: 1.5 10*3/uL (ref 0.7–4.0)
MCH: 30.8 pg (ref 26.0–34.0)
MCHC: 31.6 g/dL (ref 30.0–36.0)
MCV: 97.5 fL (ref 80.0–100.0)
Monocytes Absolute: 0.4 10*3/uL (ref 0.1–1.0)
Monocytes Relative: 5 %
Neutro Abs: 5.1 10*3/uL (ref 1.7–7.7)
Neutrophils Relative %: 71 %
Platelet Count: 241 10*3/uL (ref 150–400)
RBC: 4.03 MIL/uL (ref 3.87–5.11)
RDW: 13.2 % (ref 11.5–15.5)
WBC Count: 7.1 10*3/uL (ref 4.0–10.5)
nRBC: 0 % (ref 0.0–0.2)

## 2020-05-10 NOTE — Telephone Encounter (Signed)
Scheduled appts per 9/20 los. Gave pt a print out of AVS.  

## 2020-05-31 DIAGNOSIS — K760 Fatty (change of) liver, not elsewhere classified: Secondary | ICD-10-CM | POA: Diagnosis not present

## 2020-05-31 DIAGNOSIS — R748 Abnormal levels of other serum enzymes: Secondary | ICD-10-CM | POA: Diagnosis not present

## 2020-05-31 DIAGNOSIS — K8309 Other cholangitis: Secondary | ICD-10-CM | POA: Diagnosis not present

## 2020-06-08 DIAGNOSIS — L820 Inflamed seborrheic keratosis: Secondary | ICD-10-CM | POA: Diagnosis not present

## 2020-06-14 DIAGNOSIS — R5382 Chronic fatigue, unspecified: Secondary | ICD-10-CM | POA: Diagnosis not present

## 2020-06-14 DIAGNOSIS — E039 Hypothyroidism, unspecified: Secondary | ICD-10-CM | POA: Diagnosis not present

## 2020-06-14 DIAGNOSIS — Z8639 Personal history of other endocrine, nutritional and metabolic disease: Secondary | ICD-10-CM | POA: Diagnosis not present

## 2020-06-17 DIAGNOSIS — Z6841 Body Mass Index (BMI) 40.0 and over, adult: Secondary | ICD-10-CM | POA: Diagnosis not present

## 2020-06-17 DIAGNOSIS — R7309 Other abnormal glucose: Secondary | ICD-10-CM | POA: Diagnosis not present

## 2020-06-17 DIAGNOSIS — Z8639 Personal history of other endocrine, nutritional and metabolic disease: Secondary | ICD-10-CM | POA: Diagnosis not present

## 2020-06-17 DIAGNOSIS — E039 Hypothyroidism, unspecified: Secondary | ICD-10-CM | POA: Diagnosis not present

## 2020-06-17 DIAGNOSIS — R5382 Chronic fatigue, unspecified: Secondary | ICD-10-CM | POA: Diagnosis not present

## 2020-06-17 DIAGNOSIS — Z713 Dietary counseling and surveillance: Secondary | ICD-10-CM | POA: Diagnosis not present

## 2020-06-17 DIAGNOSIS — R7301 Impaired fasting glucose: Secondary | ICD-10-CM | POA: Diagnosis not present

## 2020-06-25 ENCOUNTER — Other Ambulatory Visit: Payer: Self-pay | Admitting: Family Medicine

## 2020-06-25 DIAGNOSIS — F341 Dysthymic disorder: Secondary | ICD-10-CM

## 2020-07-08 ENCOUNTER — Encounter: Payer: Self-pay | Admitting: Obstetrics and Gynecology

## 2020-07-13 DIAGNOSIS — H524 Presbyopia: Secondary | ICD-10-CM | POA: Diagnosis not present

## 2020-07-13 DIAGNOSIS — H43393 Other vitreous opacities, bilateral: Secondary | ICD-10-CM | POA: Diagnosis not present

## 2020-07-13 DIAGNOSIS — H16223 Keratoconjunctivitis sicca, not specified as Sjogren's, bilateral: Secondary | ICD-10-CM | POA: Diagnosis not present

## 2020-07-13 DIAGNOSIS — H019 Unspecified inflammation of eyelid: Secondary | ICD-10-CM | POA: Diagnosis not present

## 2020-07-13 DIAGNOSIS — H5203 Hypermetropia, bilateral: Secondary | ICD-10-CM | POA: Diagnosis not present

## 2020-07-13 DIAGNOSIS — H52223 Regular astigmatism, bilateral: Secondary | ICD-10-CM | POA: Diagnosis not present

## 2020-07-14 ENCOUNTER — Other Ambulatory Visit: Payer: Self-pay | Admitting: Family Medicine

## 2020-07-14 DIAGNOSIS — G6289 Other specified polyneuropathies: Secondary | ICD-10-CM

## 2020-07-14 NOTE — Telephone Encounter (Signed)
Requesting: tramadol 50mg  Contract: None UDS: None Last Visit: 05/03/2020 Next Visit: None scheduled  Last Refill: 03/17/2020 #90 and 2RF Pt sig: 1 tab tid prn  Please Advise

## 2020-07-22 DIAGNOSIS — Z23 Encounter for immunization: Secondary | ICD-10-CM | POA: Diagnosis not present

## 2020-07-23 ENCOUNTER — Encounter: Payer: Self-pay | Admitting: Obstetrics and Gynecology

## 2020-08-16 DIAGNOSIS — H019 Unspecified inflammation of eyelid: Secondary | ICD-10-CM | POA: Diagnosis not present

## 2020-08-16 DIAGNOSIS — H16223 Keratoconjunctivitis sicca, not specified as Sjogren's, bilateral: Secondary | ICD-10-CM | POA: Diagnosis not present

## 2020-08-18 DIAGNOSIS — Z6841 Body Mass Index (BMI) 40.0 and over, adult: Secondary | ICD-10-CM | POA: Diagnosis not present

## 2020-08-18 DIAGNOSIS — M170 Bilateral primary osteoarthritis of knee: Secondary | ICD-10-CM | POA: Diagnosis not present

## 2020-08-23 ENCOUNTER — Other Ambulatory Visit: Payer: Medicare Other

## 2020-08-25 ENCOUNTER — Ambulatory Visit (INDEPENDENT_AMBULATORY_CARE_PROVIDER_SITE_OTHER): Payer: Medicare Other

## 2020-08-25 ENCOUNTER — Other Ambulatory Visit: Payer: Self-pay

## 2020-08-25 DIAGNOSIS — J849 Interstitial pulmonary disease, unspecified: Secondary | ICD-10-CM | POA: Diagnosis not present

## 2020-08-25 DIAGNOSIS — R0602 Shortness of breath: Secondary | ICD-10-CM | POA: Diagnosis not present

## 2020-08-25 DIAGNOSIS — J439 Emphysema, unspecified: Secondary | ICD-10-CM | POA: Diagnosis not present

## 2020-09-09 ENCOUNTER — Other Ambulatory Visit: Payer: Self-pay | Admitting: Radiology

## 2020-09-09 ENCOUNTER — Encounter: Payer: Self-pay | Admitting: Family Medicine

## 2020-09-09 DIAGNOSIS — R921 Mammographic calcification found on diagnostic imaging of breast: Secondary | ICD-10-CM | POA: Diagnosis not present

## 2020-09-09 DIAGNOSIS — N6489 Other specified disorders of breast: Secondary | ICD-10-CM | POA: Diagnosis not present

## 2020-09-14 ENCOUNTER — Ambulatory Visit: Payer: Medicare Other | Admitting: Pulmonary Disease

## 2020-09-22 ENCOUNTER — Other Ambulatory Visit: Payer: Self-pay | Admitting: Family Medicine

## 2020-10-08 ENCOUNTER — Other Ambulatory Visit (HOSPITAL_COMMUNITY)
Admission: RE | Admit: 2020-10-08 | Discharge: 2020-10-08 | Disposition: A | Discharge status: Home / Self Care | Payer: Medicare Other | Source: Ambulatory Visit | Attending: Acute Care | Admitting: Acute Care

## 2020-10-08 DIAGNOSIS — Z20822 Contact with and (suspected) exposure to covid-19: Secondary | ICD-10-CM | POA: Diagnosis not present

## 2020-10-08 DIAGNOSIS — Z01812 Encounter for preprocedural laboratory examination: Secondary | ICD-10-CM | POA: Insufficient documentation

## 2020-10-09 LAB — SARS CORONAVIRUS 2 (TAT 6-24 HRS): SARS Coronavirus 2: NEGATIVE

## 2020-10-11 ENCOUNTER — Ambulatory Visit (INDEPENDENT_AMBULATORY_CARE_PROVIDER_SITE_OTHER): Payer: Medicare Other | Admitting: Acute Care

## 2020-10-11 ENCOUNTER — Encounter: Payer: Self-pay | Admitting: Acute Care

## 2020-10-11 ENCOUNTER — Other Ambulatory Visit: Payer: Self-pay

## 2020-10-11 ENCOUNTER — Ambulatory Visit (INDEPENDENT_AMBULATORY_CARE_PROVIDER_SITE_OTHER): Payer: Medicare Other | Admitting: Pulmonary Disease

## 2020-10-11 VITALS — BP 122/72 | HR 72 | Temp 98.6°F | Ht 66.54 in | Wt 287.0 lb

## 2020-10-11 DIAGNOSIS — Z6841 Body Mass Index (BMI) 40.0 and over, adult: Secondary | ICD-10-CM

## 2020-10-11 DIAGNOSIS — J42 Unspecified chronic bronchitis: Secondary | ICD-10-CM | POA: Diagnosis not present

## 2020-10-11 DIAGNOSIS — R009 Unspecified abnormalities of heart beat: Secondary | ICD-10-CM

## 2020-10-11 DIAGNOSIS — R7989 Other specified abnormal findings of blood chemistry: Secondary | ICD-10-CM | POA: Diagnosis not present

## 2020-10-11 DIAGNOSIS — J849 Interstitial pulmonary disease, unspecified: Secondary | ICD-10-CM | POA: Diagnosis not present

## 2020-10-11 LAB — PULMONARY FUNCTION TEST
DL/VA % pred: 120 %
DL/VA: 4.88 ml/min/mmHg/L
DLCO cor % pred: 102 %
DLCO cor: 21.41 ml/min/mmHg
DLCO unc % pred: 102 %
DLCO unc: 21.41 ml/min/mmHg
FEF 25-75 Post: 1.25 L/sec
FEF 25-75 Pre: 0.9 L/sec
FEF2575-%Change-Post: 39 %
FEF2575-%Pred-Post: 67 %
FEF2575-%Pred-Pre: 48 %
FEV1-%Change-Post: 9 %
FEV1-%Pred-Post: 70 %
FEV1-%Pred-Pre: 64 %
FEV1-Post: 1.68 L
FEV1-Pre: 1.54 L
FEV1FVC-%Change-Post: 9 %
FEV1FVC-%Pred-Pre: 92 %
FEV6-%Change-Post: 0 %
FEV6-%Pred-Post: 73 %
FEV6-%Pred-Pre: 73 %
FEV6-Post: 2.22 L
FEV6-Pre: 2.22 L
FEV6FVC-%Change-Post: 0 %
FEV6FVC-%Pred-Post: 104 %
FEV6FVC-%Pred-Pre: 104 %
FVC-%Change-Post: 0 %
FVC-%Pred-Post: 70 %
FVC-%Pred-Pre: 70 %
FVC-Post: 2.22 L
FVC-Pre: 2.22 L
Post FEV1/FVC ratio: 76 %
Post FEV6/FVC ratio: 100 %
Pre FEV1/FVC ratio: 69 %
Pre FEV6/FVC Ratio: 100 %
RV % pred: 96 %
RV: 2.32 L
TLC % pred: 88 %
TLC: 4.83 L

## 2020-10-11 MED ORDER — ALBUTEROL SULFATE HFA 108 (90 BASE) MCG/ACT IN AERS: 1.0000 | INHALATION_SPRAY | Freq: Four times a day (QID) | RESPIRATORY_TRACT | 2 refills | Status: DC | PRN

## 2020-10-11 MED ORDER — BUDESONIDE-FORMOTEROL FUMARATE 160-4.5 MCG/ACT IN AERO: 2.0000 | INHALATION_SPRAY | Freq: Two times a day (BID) | RESPIRATORY_TRACT | 6 refills | Status: DC

## 2020-10-11 NOTE — Patient Instructions (Addendum)
It is good to see you today. Your CT Chest shows stable ILD. ( No progression) Continue your Symbicort 1 puff twice daily as you have been doing. Rinse mouth after use.  Continue using rescue inhaler for breakthrough shortness of breath.  Let us know if you change your mind about CPAP machine, Overnight oxygen (O&O) or an EKG. We will repeat PFT's and HRCT 08/2021.  Continue to work on weight loss. Continue to work on fitness to build strength. Continue prn zyrtec, neti pot Follow up with PCP regarding irregular pulse. Follow up with GI re: LFT's  We will refill your prescriptions. ( Symbicort and Albuterol)  Follow up in 1 year with Pulmonary Judson Roch NP or Dr. Halford Chessman.) Please contact office for sooner follow up if symptoms do not improve or worsen or seek emergency care

## 2020-10-11 NOTE — Progress Notes (Signed)
Reviewed and agree with assessment/plan.   Chesley Mires, MD [facility] Pulmonary/Critical Care 10/11/2020, 2:04 PM Pager:  [phone]

## 2020-10-11 NOTE — Progress Notes (Signed)
PFT done today. 
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
## 2020-10-21 ENCOUNTER — Ambulatory Visit: Payer: Medicare Other | Admitting: *Deleted

## 2020-10-21 ENCOUNTER — Other Ambulatory Visit: Payer: Self-pay

## 2020-10-21 ENCOUNTER — Ambulatory Visit (INDEPENDENT_AMBULATORY_CARE_PROVIDER_SITE_OTHER): Payer: Medicare Other

## 2020-10-21 VITALS — BP 142/80 | HR 94 | Temp 98.1°F | Resp 16 | Ht 67.0 in | Wt 286.4 lb

## 2020-10-21 DIAGNOSIS — Z0000 Encounter for general adult medical examination without abnormal findings: Secondary | ICD-10-CM | POA: Diagnosis not present

## 2020-10-21 NOTE — Patient Instructions (Signed)
Shelly Mosley , Thank you for taking time to come for your Medicare Wellness Visit. I appreciate your ongoing commitment to your health goals. Please review the following plan we discussed and let me know if I can assist you in the future.   Screening recommendations/referrals: Colonoscopy: Completed 09/25/2014-No longer required after age 75. Mammogram:Scheduled for 11/2020 Bone Density: Completed 01/15/2019-Due-01/14/2021 Recommended yearly ophthalmology/optometry visit for glaucoma screening and checkup Recommended yearly dental visit for hygiene and checkup  Vaccinations: Influenza vaccine: Up to date Pneumococcal vaccine: Completed vaccines Tdap vaccine: Discuss with pharmacy Shingles vaccine: Discuss with pharmacy   Covid-19:Completed vaccines  Advanced directives: Please bring a copy for your chart  Conditions/risks identified: See problem list  Next appointment: Follow up in one year for your annual wellness visit 10/27/2021 @ 9:00   Preventive Care 75 Years and Older, Female Preventive care refers to lifestyle choices and visits with your health care provider that can promote health and wellness. What does preventive care include?  A yearly physical exam. This is also called an annual well check.  Dental exams once or twice a year.  Routine eye exams. Ask your health care provider how often you should have your eyes checked.  Personal lifestyle choices, including:  Daily care of your teeth and gums.  Regular physical activity.  Eating a healthy diet.  Avoiding tobacco and drug use.  Limiting alcohol use.  Practicing safe sex.  Taking low-dose aspirin every day.  Taking vitamin and mineral supplements as recommended by your health care provider. What happens during an annual well check? The services and screenings done by your health care provider during your annual well check will depend on your age, overall health, lifestyle risk factors, and family history of  disease. Counseling  Your health care provider may ask you questions about your:  Alcohol use.  Tobacco use.  Drug use.  Emotional well-being.  Home and relationship well-being.  Sexual activity.  Eating habits.  History of falls.  Memory and ability to understand (cognition).  Work and work Statistician.  Reproductive health. Screening  You may have the following tests or measurements:  Height, weight, and BMI.  Blood pressure.  Lipid and cholesterol levels. These may be checked every 5 years, or more frequently if you are over 29 years old.  Skin check.  Lung cancer screening. You may have this screening every year starting at age 75 if you have a 30-pack-year history of smoking and currently smoke or have quit within the past 15 years.  Fecal occult blood test (FOBT) of the stool. You may have this test every year starting at age 75.  Flexible sigmoidoscopy or colonoscopy. You may have a sigmoidoscopy every 5 years or a colonoscopy every 10 years starting at age 75.  Hepatitis C blood test.  Hepatitis B blood test.  Sexually transmitted disease (STD) testing.  Diabetes screening. This is done by checking your blood sugar (glucose) after you have not eaten for a while (fasting). You may have this done every 1-3 years.  Bone density scan. This is done to screen for osteoporosis. You may have this done starting at age 75.  Mammogram. This may be done every 1-2 years. Talk to your health care provider about how often you should have regular mammograms. Talk with your health care provider about your test results, treatment options, and if necessary, the need for more tests. Vaccines  Your health care provider may recommend certain vaccines, such as:  Influenza vaccine. This is recommended  every year.  Tetanus, diphtheria, and acellular pertussis (Tdap, Td) vaccine. You may need a Td booster every 10 years.  Zoster vaccine. You may need this after age  19.  Pneumococcal 13-valent conjugate (PCV13) vaccine. One dose is recommended after age 75.  Pneumococcal polysaccharide (PPSV23) vaccine. One dose is recommended after age 70. Talk to your health care provider about which screenings and vaccines you need and how often you need them. This information is not intended to replace advice given to you by your health care provider. Make sure you discuss any questions you have with your health care provider. Document Released: 09/03/2015 Document Revised: 04/26/2016 Document Reviewed: 06/08/2015 Elsevier Interactive Patient Education  2017 [place] Prevention in the Home Falls can cause injuries. They can happen to people of all ages. There are many things you can do to make your home safe and to help prevent falls. What can I do on the outside of my home?  Regularly fix the edges of walkways and driveways and fix any cracks.  Remove anything that might make you trip as you walk through a door, such as a raised step or threshold.  Trim any bushes or trees on the path to your home.  Use bright outdoor lighting.  Clear any walking paths of anything that might make someone trip, such as rocks or tools.  Regularly check to see if handrails are loose or broken. Make sure that both sides of any steps have handrails.  Any raised decks and porches should have guardrails on the edges.  Have any leaves, snow, or ice cleared regularly.  Use sand or salt on walking paths during winter.  Clean up any spills in your garage right away. This includes oil or grease spills. What can I do in the bathroom?  Use night lights.  Install grab bars by the toilet and in the tub and shower. Do not use towel bars as grab bars.  Use non-skid mats or decals in the tub or shower.  If you need to sit down in the shower, use a plastic, non-slip stool.  Keep the floor dry. Clean up any water that spills on the floor as soon as it happens.  Remove  soap buildup in the tub or shower regularly.  Attach bath mats securely with double-sided non-slip rug tape.  Do not have throw rugs and other things on the floor that can make you trip. What can I do in the bedroom?  Use night lights.  Make sure that you have a light by your bed that is easy to reach.  Do not use any sheets or blankets that are too big for your bed. They should not hang down onto the floor.  Have a firm chair that has side arms. You can use this for support while you get dressed.  Do not have throw rugs and other things on the floor that can make you trip. What can I do in the kitchen?  Clean up any spills right away.  Avoid walking on wet floors.  Keep items that you use a lot in easy-to-reach places.  If you need to reach something above you, use a strong step stool that has a grab bar.  Keep electrical cords out of the way.  Do not use floor polish or wax that makes floors slippery. If you must use wax, use non-skid floor wax.  Do not have throw rugs and other things on the floor that can make you trip. What  can I do with my stairs?  Do not leave any items on the stairs.  Make sure that there are handrails on both sides of the stairs and use them. Fix handrails that are broken or loose. Make sure that handrails are as long as the stairways.  Check any carpeting to make sure that it is firmly attached to the stairs. Fix any carpet that is loose or worn.  Avoid having throw rugs at the top or bottom of the stairs. If you do have throw rugs, attach them to the floor with carpet tape.  Make sure that you have a light switch at the top of the stairs and the bottom of the stairs. If you do not have them, ask someone to add them for you. What else can I do to help prevent falls?  Wear shoes that:  Do not have high heels.  Have rubber bottoms.  Are comfortable and fit you well.  Are closed at the toe. Do not wear sandals.  If you use a  stepladder:  Make sure that it is fully opened. Do not climb a closed stepladder.  Make sure that both sides of the stepladder are locked into place.  Ask someone to hold it for you, if possible.  Clearly mark and make sure that you can see:  Any grab bars or handrails.  First and last steps.  Where the edge of each step is.  Use tools that help you move around (mobility aids) if they are needed. These include:  Canes.  Walkers.  Scooters.  Crutches.  Turn on the lights when you go into a dark area. Replace any light bulbs as soon as they burn out.  Set up your furniture so you have a clear path. Avoid moving your furniture around.  If any of your floors are uneven, fix them.  If there are any pets around you, be aware of where they are.  Review your medicines with your doctor. Some medicines can make you feel dizzy. This can increase your chance of falling. Ask your doctor what other things that you can do to help prevent falls. This information is not intended to replace advice given to you by your health care provider. Make sure you discuss any questions you have with your health care provider. Document Released: 06/03/2009 Document Revised: 01/13/2016 Document Reviewed: 09/11/2014 Elsevier Interactive Patient Education  2017 [employer].
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
## 2020-10-24 ENCOUNTER — Other Ambulatory Visit: Payer: Self-pay | Admitting: Family Medicine

## 2020-10-24 DIAGNOSIS — G6289 Other specified polyneuropathies: Secondary | ICD-10-CM

## 2020-11-06 NOTE — Patient Instructions (Addendum)
Good to see you again today!   Please start on an aspirin daily We will set you up to see cardiology and do a heart monitor for you.  If any more episodes that are more severe or any chest pain please seek care at the ER  Assuming all is well let's plan to visit in 6 months
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
## 2020-11-08 ENCOUNTER — Ambulatory Visit (INDEPENDENT_AMBULATORY_CARE_PROVIDER_SITE_OTHER): Payer: Medicare Other | Admitting: Family Medicine

## 2020-11-08 ENCOUNTER — Encounter: Payer: Self-pay | Admitting: *Deleted

## 2020-11-08 ENCOUNTER — Other Ambulatory Visit: Payer: Self-pay

## 2020-11-08 ENCOUNTER — Ambulatory Visit (INDEPENDENT_AMBULATORY_CARE_PROVIDER_SITE_OTHER): Payer: Medicare Other

## 2020-11-08 ENCOUNTER — Encounter: Payer: Self-pay | Admitting: Family Medicine

## 2020-11-08 VITALS — BP 134/62 | HR 92 | Temp 97.8°F | Resp 19 | Ht 67.0 in | Wt 285.0 lb

## 2020-11-08 DIAGNOSIS — E782 Mixed hyperlipidemia: Secondary | ICD-10-CM

## 2020-11-08 DIAGNOSIS — K760 Fatty (change of) liver, not elsewhere classified: Secondary | ICD-10-CM

## 2020-11-08 DIAGNOSIS — I499 Cardiac arrhythmia, unspecified: Secondary | ICD-10-CM | POA: Diagnosis not present

## 2020-11-08 DIAGNOSIS — I890 Lymphedema, not elsewhere classified: Secondary | ICD-10-CM

## 2020-11-08 DIAGNOSIS — G6289 Other specified polyneuropathies: Secondary | ICD-10-CM | POA: Diagnosis not present

## 2020-11-08 DIAGNOSIS — E039 Hypothyroidism, unspecified: Secondary | ICD-10-CM

## 2020-11-08 DIAGNOSIS — E2839 Other primary ovarian failure: Secondary | ICD-10-CM

## 2020-11-08 DIAGNOSIS — I10 Essential (primary) hypertension: Secondary | ICD-10-CM | POA: Diagnosis not present

## 2020-11-08 DIAGNOSIS — R002 Palpitations: Secondary | ICD-10-CM

## 2020-11-08 DIAGNOSIS — G8929 Other chronic pain: Secondary | ICD-10-CM

## 2020-11-08 DIAGNOSIS — M25561 Pain in right knee: Secondary | ICD-10-CM

## 2020-11-08 DIAGNOSIS — M25562 Pain in left knee: Secondary | ICD-10-CM | POA: Diagnosis not present

## 2020-11-08 LAB — CBC
HCT: 40.3 % (ref 36.0–46.0)
Hemoglobin: 13.4 g/dL (ref 12.0–15.0)
MCHC: 33.4 g/dL (ref 30.0–36.0)
MCV: 94.3 fl (ref 78.0–100.0)
Platelets: 242 10*3/uL (ref 150.0–400.0)
RBC: 4.27 Mil/uL (ref 3.87–5.11)
RDW: 13.5 % (ref 11.5–15.5)
WBC: 8.4 10*3/uL (ref 4.0–10.5)

## 2020-11-08 LAB — COMPREHENSIVE METABOLIC PANEL
ALT: 35 U/L (ref 0–35)
AST: 33 U/L (ref 0–37)
Albumin: 4.4 g/dL (ref 3.5–5.2)
Alkaline Phosphatase: 151 U/L — ABNORMAL HIGH (ref 39–117)
BUN: 16 mg/dL (ref 6–23)
CO2: 34 mEq/L — ABNORMAL HIGH (ref 19–32)
Calcium: 10 mg/dL (ref 8.4–10.5)
Chloride: 98 mEq/L (ref 96–112)
Creatinine, Ser: 0.8 mg/dL (ref 0.40–1.20)
GFR: 72.44 mL/min (ref >60.00)
Glucose, Bld: 89 mg/dL (ref 70–99)
Potassium: 4 mEq/L (ref 3.5–5.1)
Sodium: 140 mEq/L (ref 135–145)
Total Bilirubin: 0.5 mg/dL (ref 0.2–1.2)
Total Protein: 7.1 g/dL (ref 6.0–8.3)

## 2020-11-08 LAB — TSH: TSH: 3.5 u[IU]/mL (ref 0.35–4.50)

## 2020-11-08 MED ORDER — FUROSEMIDE 80 MG PO TABS: ORAL_TABLET | ORAL | 3 refills | Status: DC

## 2020-11-08 NOTE — Progress Notes (Signed)
Patient ID: Shelly Mosley, female   DOB: 1945-12-29, 75 y.o.   MRN: [id number] Patient enrolled for [employer] to ship a 7 day ZIO XT long term holter monitor to her home. Letter with instructions mailed to patient.

## 2020-11-09 DIAGNOSIS — R002 Palpitations: Secondary | ICD-10-CM | POA: Insufficient documentation

## 2020-11-09 DIAGNOSIS — I10 Essential (primary) hypertension: Secondary | ICD-10-CM | POA: Insufficient documentation

## 2020-11-09 DIAGNOSIS — F32A Depression, unspecified: Secondary | ICD-10-CM | POA: Insufficient documentation

## 2020-11-09 DIAGNOSIS — K589 Irritable bowel syndrome without diarrhea: Secondary | ICD-10-CM | POA: Insufficient documentation

## 2020-11-09 DIAGNOSIS — M797 Fibromyalgia: Secondary | ICD-10-CM | POA: Insufficient documentation

## 2020-11-09 DIAGNOSIS — I499 Cardiac arrhythmia, unspecified: Secondary | ICD-10-CM | POA: Insufficient documentation

## 2020-11-09 DIAGNOSIS — F419 Anxiety disorder, unspecified: Secondary | ICD-10-CM | POA: Insufficient documentation

## 2020-11-09 DIAGNOSIS — E039 Hypothyroidism, unspecified: Secondary | ICD-10-CM | POA: Insufficient documentation

## 2020-11-09 DIAGNOSIS — R0609 Other forms of dyspnea: Secondary | ICD-10-CM | POA: Insufficient documentation

## 2020-11-09 DIAGNOSIS — M1990 Unspecified osteoarthritis, unspecified site: Secondary | ICD-10-CM | POA: Insufficient documentation

## 2020-11-09 DIAGNOSIS — C801 Malignant (primary) neoplasm, unspecified: Secondary | ICD-10-CM | POA: Insufficient documentation

## 2020-11-09 DIAGNOSIS — R0602 Shortness of breath: Secondary | ICD-10-CM | POA: Insufficient documentation

## 2020-11-09 DIAGNOSIS — H0520 Unspecified exophthalmos: Secondary | ICD-10-CM | POA: Insufficient documentation

## 2020-11-09 DIAGNOSIS — G8929 Other chronic pain: Secondary | ICD-10-CM | POA: Insufficient documentation

## 2020-11-09 DIAGNOSIS — D0590 Unspecified type of carcinoma in situ of unspecified breast: Secondary | ICD-10-CM | POA: Insufficient documentation

## 2020-11-09 DIAGNOSIS — E785 Hyperlipidemia, unspecified: Secondary | ICD-10-CM | POA: Insufficient documentation

## 2020-11-09 DIAGNOSIS — G629 Polyneuropathy, unspecified: Secondary | ICD-10-CM | POA: Insufficient documentation

## 2020-11-11 DIAGNOSIS — I499 Cardiac arrhythmia, unspecified: Secondary | ICD-10-CM | POA: Diagnosis not present

## 2020-11-11 DIAGNOSIS — R002 Palpitations: Secondary | ICD-10-CM

## 2020-11-16 ENCOUNTER — Other Ambulatory Visit: Payer: Self-pay | Admitting: Family Medicine

## 2020-11-16 DIAGNOSIS — G6289 Other specified polyneuropathies: Secondary | ICD-10-CM
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
## 2020-11-19 ENCOUNTER — Encounter: Payer: Self-pay | Admitting: Cardiology

## 2020-11-19 ENCOUNTER — Other Ambulatory Visit: Payer: Self-pay

## 2020-11-19 ENCOUNTER — Ambulatory Visit (INDEPENDENT_AMBULATORY_CARE_PROVIDER_SITE_OTHER): Payer: Medicare Other | Admitting: Cardiology

## 2020-11-19 VITALS — BP 111/67 | HR 64 | Ht 67.0 in | Wt 284.1 lb

## 2020-11-19 DIAGNOSIS — I359 Nonrheumatic aortic valve disorder, unspecified: Secondary | ICD-10-CM

## 2020-11-19 DIAGNOSIS — R5382 Chronic fatigue, unspecified: Secondary | ICD-10-CM | POA: Insufficient documentation

## 2020-11-19 DIAGNOSIS — G9332 Myalgic encephalomyelitis/chronic fatigue syndrome: Secondary | ICD-10-CM | POA: Insufficient documentation

## 2020-11-19 DIAGNOSIS — R002 Palpitations: Secondary | ICD-10-CM

## 2020-11-19 DIAGNOSIS — I2510 Atherosclerotic heart disease of native coronary artery without angina pectoris: Secondary | ICD-10-CM | POA: Diagnosis not present

## 2020-11-19 DIAGNOSIS — E789 Disorder of lipoprotein metabolism, unspecified: Secondary | ICD-10-CM | POA: Insufficient documentation

## 2020-11-19 NOTE — Addendum Note (Signed)
Addended by: Resa Miner I on: 11/19/2020 12:23 PM   Modules accepted: Orders

## 2020-11-19 NOTE — Patient Instructions (Signed)
Medication Instructions:  Your physician recommends that you continue on your current medications as directed. Please refer to the Current Medication list given to you today.  *If you need a refill on your cardiac medications before your next appointment, please call your pharmacy*   Lab Work: None If you have labs (blood work) drawn today and your tests are completely normal, you will receive your results only by: Marland Kitchen MyChart Message (if you have MyChart) OR . A paper copy in the mail If you have any lab test that is abnormal or we need to change your treatment, we will call you to review the results.   Testing/Procedures: None   Follow-Up: At [facility], you and your health needs are our priority.  As part of our continuing mission to provide you with exceptional heart care, we have created designated Provider Care Teams.  These Care Teams include your primary Cardiologist (physician) and Advanced Practice Providers (APPs -  Physician Assistants and Nurse Practitioners) who all work together to provide you with the care you need, when you need it.  We recommend signing up for the patient portal called "MyChart".  Sign up information is provided on this After Visit Summary.  MyChart is used to connect with patients for Virtual Visits (Telemedicine).  Patients are able to view lab/test results, encounter notes, upcoming appointments, etc.  Non-urgent messages can be sent to your provider as well.   To learn more about what you can do with MyChart, go to [email or web address].    Your next appointment:   6 week(s)  The format for your next appointment:   In Person  Provider:   Shirlee More, MD   Other Instructions

## 2020-11-22 DIAGNOSIS — R002 Palpitations: Secondary | ICD-10-CM | POA: Diagnosis not present

## 2020-11-22 DIAGNOSIS — I499 Cardiac arrhythmia, unspecified: Secondary | ICD-10-CM | POA: Diagnosis not present

## 2020-11-24 ENCOUNTER — Telehealth: Payer: Self-pay

## 2020-11-24 NOTE — Telephone Encounter (Signed)
Spoke with patient regarding results and recommendation.  Patient verbalizes understanding and is agreeable to plan of care. Advised patient to call back with any issues or concerns.  

## 2020-11-24 NOTE — Telephone Encounter (Signed)
-----   Message from Richardo Priest, MD sent at 11/24/2020 12:00 PM EDT ----- Regarding: event monitor Overall a good report of help she is reassured ----- Message ----- From: Burnell Blanks, MD Sent: 11/24/2020   8:50 AM EDT To: Richardo Priest, MD, Darreld Mclean, MD

## 2020-11-30 ENCOUNTER — Ambulatory Visit
Admission: RE | Admit: 2020-11-30 | Discharge: 2020-11-30 | Disposition: A | Discharge status: Home / Self Care | Payer: Medicare Other | Source: Ambulatory Visit | Attending: Oncology | Admitting: Oncology

## 2020-11-30 ENCOUNTER — Other Ambulatory Visit: Payer: Self-pay

## 2020-11-30 DIAGNOSIS — Z1239 Encounter for other screening for malignant neoplasm of breast: Secondary | ICD-10-CM

## 2020-11-30 DIAGNOSIS — J42 Unspecified chronic bronchitis: Secondary | ICD-10-CM

## 2020-11-30 DIAGNOSIS — Z803 Family history of malignant neoplasm of breast: Secondary | ICD-10-CM | POA: Diagnosis not present

## 2020-11-30 DIAGNOSIS — C541 Malignant neoplasm of endometrium: Secondary | ICD-10-CM

## 2020-11-30 MED ORDER — GADOBUTROL 1 MMOL/ML IV SOLN
10.0000 mL | Freq: Once | INTRAVENOUS | Status: AC | PRN
  Administered 2020-11-30: 10 mL via INTRAVENOUS

## 2020-12-16 ENCOUNTER — Emergency Department (INDEPENDENT_AMBULATORY_CARE_PROVIDER_SITE_OTHER)
Admission: EM | Admit: 2020-12-16 | Discharge: 2020-12-16 | Disposition: A | Discharge status: Home / Self Care | Payer: Medicare Other | Source: Home / Self Care | Attending: Family Medicine | Admitting: Family Medicine

## 2020-12-16 ENCOUNTER — Other Ambulatory Visit: Payer: Self-pay

## 2020-12-16 DIAGNOSIS — R350 Frequency of micturition: Secondary | ICD-10-CM | POA: Diagnosis not present

## 2020-12-16 LAB — POCT URINALYSIS DIP (MANUAL ENTRY)
Bilirubin, UA: NEGATIVE
Blood, UA: NEGATIVE
Glucose, UA: NEGATIVE mg/dL
Ketones, POC UA: NEGATIVE mg/dL
Nitrite, UA: NEGATIVE
Protein Ur, POC: NEGATIVE mg/dL
Spec Grav, UA: 1.01 (ref 1.010–1.025)
Urobilinogen, UA: 0.2 E.U./dL
pH, UA: 5.5 (ref 5.0–8.0)

## 2020-12-16 MED ORDER — CEPHALEXIN 500 MG PO CAPS: 500.0000 mg | ORAL_CAPSULE | Freq: Two times a day (BID) | ORAL | 0 refills | Status: DC

## 2020-12-16 NOTE — Discharge Instructions (Addendum)
Please take antibiotics to completion.  Increase daily water intake while taking these medication.

## 2020-12-16 NOTE — ED Triage Notes (Signed)
Pt presents to Urgent Care with c/o lower abdominal cramping and urinary frequency/ urgency x 1 day. Reports she began AZO yesterday.
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
## 2020-12-18 LAB — URINE CULTURE
MICRO NUMBER:: 11826164
SPECIMEN QUALITY:: ADEQUATE

## 2020-12-23 ENCOUNTER — Other Ambulatory Visit: Payer: Self-pay | Admitting: Family Medicine

## 2020-12-23 DIAGNOSIS — F341 Dysthymic disorder: Secondary | ICD-10-CM

## 2020-12-29 ENCOUNTER — Ambulatory Visit (HOSPITAL_COMMUNITY): Payer: Medicare Other | Attending: Cardiology

## 2020-12-29 ENCOUNTER — Other Ambulatory Visit: Payer: Self-pay

## 2020-12-29 ENCOUNTER — Telehealth: Payer: Self-pay

## 2020-12-29 DIAGNOSIS — I2510 Atherosclerotic heart disease of native coronary artery without angina pectoris: Secondary | ICD-10-CM | POA: Insufficient documentation

## 2020-12-29 DIAGNOSIS — I359 Nonrheumatic aortic valve disorder, unspecified: Secondary | ICD-10-CM | POA: Diagnosis not present

## 2020-12-29 DIAGNOSIS — R002 Palpitations: Secondary | ICD-10-CM | POA: Insufficient documentation

## 2020-12-29 LAB — ECHOCARDIOGRAM COMPLETE
Area-P 1/2: 3.85 cm2
S' Lateral: 2.8 cm

## 2020-12-29 NOTE — Telephone Encounter (Signed)
-----   Message from Richardo Priest, MD sent at 12/29/2020  2:51 PM EDT ----- Good result there is calcification of aortic valve that is not obstructed.

## 2020-12-29 NOTE — Telephone Encounter (Signed)
Left message on patients voicemail to please return our call.   

## 2020-12-30 ENCOUNTER — Telehealth: Payer: Self-pay
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
## 2020-12-30 NOTE — Telephone Encounter (Signed)
Left message on patients voicemail to please return our call.   

## 2020-12-30 NOTE — Telephone Encounter (Signed)
-----   Message from Brian J Munley, MD sent at 12/29/2020  2:51 PM EDT ----- Good result there is calcification of aortic valve that is not obstructed. 

## 2020-12-30 NOTE — Telephone Encounter (Signed)
Spoke with patient regarding results and recommendation.  Patient verbalizes understanding and is agreeable to plan of care. Advised patient to call back with any issues or concerns.  

## 2020-12-31 ENCOUNTER — Other Ambulatory Visit: Payer: Self-pay

## 2020-12-31 ENCOUNTER — Encounter: Payer: Self-pay | Admitting: Cardiology

## 2020-12-31 ENCOUNTER — Ambulatory Visit (INDEPENDENT_AMBULATORY_CARE_PROVIDER_SITE_OTHER): Payer: Medicare Other | Admitting: Cardiology

## 2020-12-31 VITALS — BP 118/74 | HR 74 | Ht 67.0 in | Wt 289.0 lb

## 2020-12-31 DIAGNOSIS — I359 Nonrheumatic aortic valve disorder, unspecified: Secondary | ICD-10-CM

## 2020-12-31 DIAGNOSIS — R002 Palpitations: Secondary | ICD-10-CM | POA: Diagnosis not present

## 2020-12-31 DIAGNOSIS — I2510 Atherosclerotic heart disease of native coronary artery without angina pectoris: Secondary | ICD-10-CM

## 2020-12-31 DIAGNOSIS — I4510 Unspecified right bundle-branch block: Secondary | ICD-10-CM

## 2020-12-31 DIAGNOSIS — E782 Mixed hyperlipidemia: Secondary | ICD-10-CM | POA: Diagnosis not present

## 2020-12-31 DIAGNOSIS — I10 Essential (primary) hypertension: Secondary | ICD-10-CM | POA: Diagnosis not present

## 2020-12-31 NOTE — Patient Instructions (Addendum)
Medication Instructions:  Your physician recommends that you continue on your current medications as directed. Please refer to the Current Medication list given to you today.  *If you need a refill on your cardiac medications before your next appointment, please call your pharmacy*   Lab Work: None If you have labs (blood work) drawn today and your tests are completely normal, you will receive your results only by: Marland Kitchen MyChart Message (if you have MyChart) OR . A paper copy in the mail If you have any lab test that is abnormal or we need to change your treatment, we will call you to review the results.   Testing/Procedures: NOne   Follow-Up: At [facility], you and your health needs are our priority.  As part of our continuing mission to provide you with exceptional heart care, we have created designated Provider Care Teams.  These Care Teams include your primary Cardiologist (physician) and Advanced Practice Providers (APPs -  Physician Assistants and Nurse Practitioners) who all work together to provide you with the care you need, when you need it.  We recommend signing up for the patient portal called "MyChart".  Sign up information is provided on this After Visit Summary.  MyChart is used to connect with patients for Virtual Visits (Telemedicine).  Patients are able to view lab/test results, encounter notes, upcoming appointments, etc.  Non-urgent messages can be sent to your provider as well.   To learn more about what you can do with MyChart, go to [email or web address].    Your next appointment:   As needed  The format for your next appointment:   In Person  Provider:   Shirlee More, MD   Other Instructions [email or web address] [email or web address]        FDA-cleared, clinical grade mobile EKG monitor: Jodelle Red is the most clinically-validated mobile EKG used by the world's leading cardiac care medical professionals With Basic service, know  instantly if your heart rhythm is normal or if atrial fibrillation is detected, and email the last single EKG recording to yourself or your doctor Premium service, available for purchase through the Kardia app for $9.99 per month or $99 per year, includes unlimited history and storage of your EKG recordings, a monthly EKG summary report to share with your doctor, along with the ability to track your blood pressure, activity and weight Includes one KardiaMobile phone clip FREE SHIPPING: Standard delivery 1-3 business days. Orders placed by 11:00am PST will ship that afternoon. Otherwise, will ship next business day. All orders ship via [employer] from [place], [place]

## 2021-01-11 DIAGNOSIS — Z23 Encounter for immunization: Secondary | ICD-10-CM | POA: Diagnosis not present

## 2021-01-12 ENCOUNTER — Ambulatory Visit: Payer: Self-pay | Admitting: Obstetrics and Gynecology

## 2021-01-18 ENCOUNTER — Other Ambulatory Visit: Payer: Self-pay | Admitting: Family Medicine

## 2021-01-18 DIAGNOSIS — I10 Essential (primary) hypertension: Secondary | ICD-10-CM

## 2021-01-19 ENCOUNTER — Other Ambulatory Visit: Payer: Self-pay

## 2021-01-19 ENCOUNTER — Ambulatory Visit (INDEPENDENT_AMBULATORY_CARE_PROVIDER_SITE_OTHER): Payer: Medicare Other

## 2021-01-19 ENCOUNTER — Encounter: Payer: Self-pay | Admitting: Family Medicine

## 2021-01-19 DIAGNOSIS — E2839 Other primary ovarian failure: Secondary | ICD-10-CM | POA: Diagnosis not present

## 2021-01-19 DIAGNOSIS — Z780 Asymptomatic menopausal state: Secondary | ICD-10-CM | POA: Diagnosis not present

## 2021-01-20 ENCOUNTER — Emergency Department (INDEPENDENT_AMBULATORY_CARE_PROVIDER_SITE_OTHER)
Admission: EM | Admit: 2021-01-20 | Discharge: 2021-01-20 | Disposition: A | Discharge status: Home / Self Care | Payer: Medicare Other | Source: Home / Self Care

## 2021-01-20 ENCOUNTER — Other Ambulatory Visit: Payer: Self-pay

## 2021-01-20 ENCOUNTER — Encounter: Payer: Self-pay | Admitting: Emergency Medicine

## 2021-01-20 DIAGNOSIS — H01003 Unspecified blepharitis right eye, unspecified eyelid: Secondary | ICD-10-CM | POA: Diagnosis not present

## 2021-01-20 DIAGNOSIS — H01006 Unspecified blepharitis left eye, unspecified eyelid: Secondary | ICD-10-CM | POA: Diagnosis not present

## 2021-01-20 MED ORDER — NEOMYCIN-POLYMYXIN-DEXAMETH 3.5-10000-0.1 OP OINT: 1.0000 "application " | TOPICAL_OINTMENT | Freq: Three times a day (TID) | OPHTHALMIC | 1 refills | Status: AC

## 2021-01-20 NOTE — ED Triage Notes (Signed)
Eye lid swelling bilateral - common problem  Pt unable to see her opthalmologist until next week  No refills left neomycin & polymixin R eye 20/40 L eye 20/50 Both 20/30 Has ointment in both

## 2021-01-20 NOTE — Discharge Instructions (Addendum)
Advised/instructed patient to use medication as directed.  Please follow-up with ophthalmology or here if if symptoms worsen and/or unresolved.
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
## 2021-02-02 DIAGNOSIS — H43813 Vitreous degeneration, bilateral: Secondary | ICD-10-CM | POA: Diagnosis not present

## 2021-02-02 DIAGNOSIS — H3581 Retinal edema: Secondary | ICD-10-CM | POA: Diagnosis not present
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
## 2021-02-18 ENCOUNTER — Other Ambulatory Visit: Payer: Self-pay

## 2021-02-18 ENCOUNTER — Other Ambulatory Visit (HOSPITAL_COMMUNITY)
Admission: RE | Admit: 2021-02-18 | Discharge: 2021-02-18 | Disposition: A | Discharge status: Home / Self Care | Payer: Medicare Other | Source: Ambulatory Visit | Attending: Obstetrics and Gynecology | Admitting: Obstetrics and Gynecology

## 2021-02-18 ENCOUNTER — Encounter: Payer: Self-pay | Admitting: Obstetrics and Gynecology

## 2021-02-18 ENCOUNTER — Ambulatory Visit (INDEPENDENT_AMBULATORY_CARE_PROVIDER_SITE_OTHER): Payer: Medicare Other | Admitting: Obstetrics and Gynecology

## 2021-02-18 VITALS — BP 130/84 | HR 90 | Ht 67.0 in | Wt 296.0 lb

## 2021-02-18 DIAGNOSIS — Z9289 Personal history of other medical treatment: Secondary | ICD-10-CM

## 2021-02-18 DIAGNOSIS — Z01411 Encounter for gynecological examination (general) (routine) with abnormal findings: Secondary | ICD-10-CM

## 2021-02-18 DIAGNOSIS — B372 Candidiasis of skin and nail: Secondary | ICD-10-CM

## 2021-02-18 DIAGNOSIS — N949 Unspecified condition associated with female genital organs and menstrual cycle: Secondary | ICD-10-CM | POA: Insufficient documentation

## 2021-02-18 DIAGNOSIS — Z8542 Personal history of malignant neoplasm of other parts of uterus: Secondary | ICD-10-CM | POA: Diagnosis not present

## 2021-02-18 DIAGNOSIS — Z1239 Encounter for other screening for malignant neoplasm of breast: Secondary | ICD-10-CM

## 2021-02-18 DIAGNOSIS — R159 Full incontinence of feces: Secondary | ICD-10-CM

## 2021-02-18 DIAGNOSIS — Z9189 Other specified personal risk factors, not elsewhere classified: Secondary | ICD-10-CM

## 2021-02-18 DIAGNOSIS — N898 Other specified noninflammatory disorders of vagina: Secondary | ICD-10-CM | POA: Insufficient documentation

## 2021-02-18 DIAGNOSIS — Z008 Encounter for other general examination: Secondary | ICD-10-CM

## 2021-02-18 DIAGNOSIS — I2510 Atherosclerotic heart disease of native coronary artery without angina pectoris: Secondary | ICD-10-CM

## 2021-02-18 DIAGNOSIS — Z23 Encounter for immunization: Secondary | ICD-10-CM | POA: Diagnosis not present

## 2021-02-18 MED ORDER — NYSTATIN 100000 UNIT/GM EX POWD: 1.0000 "application " | Freq: Three times a day (TID) | CUTANEOUS | 2 refills | Status: DC

## 2021-02-18 NOTE — Patient Instructions (Signed)

## 2021-02-21 DIAGNOSIS — R2241 Localized swelling, mass and lump, right lower limb: Secondary | ICD-10-CM | POA: Diagnosis not present

## 2021-02-21 DIAGNOSIS — J449 Chronic obstructive pulmonary disease, unspecified: Secondary | ICD-10-CM | POA: Diagnosis not present

## 2021-02-21 DIAGNOSIS — Z888 Allergy status to other drugs, medicaments and biological substances status: Secondary | ICD-10-CM | POA: Diagnosis not present

## 2021-02-21 DIAGNOSIS — Z882 Allergy status to sulfonamides status: Secondary | ICD-10-CM | POA: Diagnosis not present

## 2021-02-21 DIAGNOSIS — E079 Disorder of thyroid, unspecified: Secondary | ICD-10-CM | POA: Diagnosis not present

## 2021-02-21 DIAGNOSIS — Z881 Allergy status to other antibiotic agents status: Secondary | ICD-10-CM | POA: Diagnosis not present

## 2021-02-21 DIAGNOSIS — Z79890 Hormone replacement therapy (postmenopausal): Secondary | ICD-10-CM | POA: Diagnosis not present

## 2021-02-21 DIAGNOSIS — M25561 Pain in right knee: Secondary | ICD-10-CM | POA: Diagnosis not present

## 2021-02-21 DIAGNOSIS — Z961 Presence of intraocular lens: Secondary | ICD-10-CM | POA: Diagnosis not present

## 2021-02-21 DIAGNOSIS — Z87891 Personal history of nicotine dependence: Secondary | ICD-10-CM | POA: Diagnosis not present

## 2021-02-21 DIAGNOSIS — R2243 Localized swelling, mass and lump, lower limb, bilateral: Secondary | ICD-10-CM | POA: Diagnosis not present

## 2021-02-21 DIAGNOSIS — G629 Polyneuropathy, unspecified: Secondary | ICD-10-CM | POA: Diagnosis not present

## 2021-02-21 DIAGNOSIS — Z886 Allergy status to analgesic agent status: Secondary | ICD-10-CM | POA: Diagnosis not present

## 2021-02-21 DIAGNOSIS — Z79899 Other long term (current) drug therapy: Secondary | ICD-10-CM | POA: Diagnosis not present

## 2021-02-21 DIAGNOSIS — Z978 Presence of other specified devices: Secondary | ICD-10-CM | POA: Diagnosis not present

## 2021-02-21 DIAGNOSIS — I10 Essential (primary) hypertension: Secondary | ICD-10-CM | POA: Diagnosis not present

## 2021-02-21 DIAGNOSIS — Z7982 Long term (current) use of aspirin: Secondary | ICD-10-CM | POA: Diagnosis not present

## 2021-02-23 DIAGNOSIS — Z9189 Other specified personal risk factors, not elsewhere classified: Secondary | ICD-10-CM | POA: Diagnosis not present

## 2021-02-23 DIAGNOSIS — B372 Candidiasis of skin and nail: Secondary | ICD-10-CM | POA: Diagnosis not present

## 2021-02-23 DIAGNOSIS — Z8542 Personal history of malignant neoplasm of other parts of uterus: Secondary | ICD-10-CM | POA: Diagnosis not present

## 2021-02-23 DIAGNOSIS — I2510 Atherosclerotic heart disease of native coronary artery without angina pectoris: Secondary | ICD-10-CM | POA: Diagnosis not present

## 2021-02-23 DIAGNOSIS — N898 Other specified noninflammatory disorders of vagina: Secondary | ICD-10-CM | POA: Diagnosis not present

## 2021-02-23 DIAGNOSIS — N949 Unspecified condition associated with female genital organs and menstrual cycle: Secondary | ICD-10-CM | POA: Diagnosis not present

## 2021-02-23 DIAGNOSIS — Z9289 Personal history of other medical treatment: Secondary | ICD-10-CM | POA: Diagnosis not present

## 2021-02-23 DIAGNOSIS — Z23 Encounter for immunization: Secondary | ICD-10-CM

## 2021-02-23 DIAGNOSIS — R159 Full incontinence of feces: Secondary | ICD-10-CM | POA: Diagnosis not present

## 2021-02-23 LAB — CYTOLOGY - PAP
Diagnosis: NEGATIVE
Diagnosis: REACTIVE
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
## 2021-03-22 ENCOUNTER — Other Ambulatory Visit (HOSPITAL_COMMUNITY)
Admission: RE | Admit: 2021-03-22 | Discharge: 2021-03-22 | Disposition: A | Discharge status: Home / Self Care | Payer: Medicare Other | Source: Ambulatory Visit | Attending: Obstetrics and Gynecology | Admitting: Obstetrics and Gynecology

## 2021-03-22 ENCOUNTER — Ambulatory Visit (INDEPENDENT_AMBULATORY_CARE_PROVIDER_SITE_OTHER): Payer: Medicare Other | Admitting: Obstetrics and Gynecology

## 2021-03-22 ENCOUNTER — Encounter: Payer: Self-pay | Admitting: Obstetrics and Gynecology

## 2021-03-22 ENCOUNTER — Other Ambulatory Visit: Payer: Self-pay

## 2021-03-22 DIAGNOSIS — N898 Other specified noninflammatory disorders of vagina: Secondary | ICD-10-CM | POA: Diagnosis not present

## 2021-03-22 DIAGNOSIS — Z8742 Personal history of other diseases of the female genital tract: Secondary | ICD-10-CM | POA: Diagnosis not present

## 2021-03-22 DIAGNOSIS — Z90710 Acquired absence of both cervix and uterus: Secondary | ICD-10-CM | POA: Diagnosis not present

## 2021-03-22 NOTE — Patient Instructions (Signed)
Please call me if you have heavy vaginal bleeding, significant pain, or fever.   We will call when the biopsy result is ready.

## 2021-03-23 LAB — SURGICAL PATHOLOGY

## 2021-03-24 DIAGNOSIS — M25561 Pain in right knee: Secondary | ICD-10-CM | POA: Insufficient documentation

## 2021-03-25 ENCOUNTER — Other Ambulatory Visit: Payer: Self-pay | Admitting: Family Medicine

## 2021-03-25 DIAGNOSIS — G6289 Other specified polyneuropathies: Secondary | ICD-10-CM

## 2021-03-25 MED ORDER — TRAMADOL HCL 50 MG PO TABS: ORAL_TABLET | ORAL | 2 refills | Status: DC

## 2021-03-25 NOTE — Telephone Encounter (Signed)
Please advise on med refill. Last seen on 11/08/20 and last filled 11/16/20 #90 + 2. No future appt

## 2021-03-25 NOTE — Telephone Encounter (Signed)
Requesting:tramadol Last Visit: 11/08/20 Next Visit: 04/04/21 Last Refill: 11/16/20  Please Advise

## 2021-03-25 NOTE — Telephone Encounter (Signed)
Medication: traMADol (ULTRAM) 50 MG tablet  Has the patient contacted their pharmacy? Yes.   (If no, request that the patient contact the pharmacy for the refill.) (If yes, when and what did the pharmacy advise?)  Preferred Pharmacy (with phone number or street name):  [id number] - [place], [place] - [address] AT [address]  [address], [place] [address]  Phone:  [phone]  Fax:  [phone]   Agent: Please be advised that RX refills may take up to 3 business days. We ask that you follow-up with your pharmacy.

## 2021-03-28 DIAGNOSIS — M25562 Pain in left knee: Secondary | ICD-10-CM | POA: Diagnosis not present

## 2021-03-28 DIAGNOSIS — M25561 Pain in right knee: Secondary | ICD-10-CM | POA: Diagnosis not present
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
## 2021-03-28 NOTE — Telephone Encounter (Signed)
Rx sent by provider

## 2021-04-04 ENCOUNTER — Ambulatory Visit (INDEPENDENT_AMBULATORY_CARE_PROVIDER_SITE_OTHER): Payer: Medicare Other | Admitting: Family Medicine

## 2021-04-04 ENCOUNTER — Other Ambulatory Visit (HOSPITAL_BASED_OUTPATIENT_CLINIC_OR_DEPARTMENT_OTHER): Payer: Self-pay

## 2021-04-04 ENCOUNTER — Other Ambulatory Visit: Payer: Self-pay

## 2021-04-04 DIAGNOSIS — E039 Hypothyroidism, unspecified: Secondary | ICD-10-CM | POA: Diagnosis not present

## 2021-04-04 DIAGNOSIS — I10 Essential (primary) hypertension: Secondary | ICD-10-CM

## 2021-04-04 DIAGNOSIS — E782 Mixed hyperlipidemia: Secondary | ICD-10-CM

## 2021-04-04 DIAGNOSIS — I2510 Atherosclerotic heart disease of native coronary artery without angina pectoris: Secondary | ICD-10-CM | POA: Diagnosis not present

## 2021-04-04 LAB — COMPREHENSIVE METABOLIC PANEL
ALT: 35 U/L (ref 0–35)
AST: 27 U/L (ref 0–37)
Albumin: 4.4 g/dL (ref 3.5–5.2)
Alkaline Phosphatase: 157 U/L — ABNORMAL HIGH (ref 39–117)
BUN: 27 mg/dL — ABNORMAL HIGH (ref 6–23)
CO2: 32 mEq/L (ref 19–32)
Calcium: 10.3 mg/dL (ref 8.4–10.5)
Chloride: 98 mEq/L (ref 96–112)
Creatinine, Ser: 0.87 mg/dL (ref 0.40–1.20)
GFR: 65.32 mL/min (ref >60.00)
Glucose, Bld: 75 mg/dL (ref 70–99)
Potassium: 4.2 mEq/L (ref 3.5–5.1)
Sodium: 139 mEq/L (ref 135–145)
Total Bilirubin: 0.4 mg/dL (ref 0.2–1.2)
Total Protein: 7.2 g/dL (ref 6.0–8.3)

## 2021-04-04 LAB — CBC
HCT: 40.7 % (ref 36.0–46.0)
Hemoglobin: 13.3 g/dL (ref 12.0–15.0)
MCHC: 32.7 g/dL (ref 30.0–36.0)
MCV: 96.4 fl (ref 78.0–100.0)
Platelets: 280 10*3/uL (ref 150.0–400.0)
RBC: 4.23 Mil/uL (ref 3.87–5.11)
RDW: 13.3 % (ref 11.5–15.5)
WBC: 9.7 10*3/uL (ref 4.0–10.5)

## 2021-04-04 LAB — LIPID PANEL
Cholesterol: 151 mg/dL (ref 0–200)
HDL: 51.5 mg/dL (ref >39.00)
LDL Cholesterol: 69 mg/dL (ref 0–99)
NonHDL: 99.15
Total CHOL/HDL Ratio: 3
Triglycerides: 152 mg/dL — ABNORMAL HIGH (ref 0.0–149.0)
VLDL: 30.4 mg/dL (ref 0.0–40.0)

## 2021-04-04 MED ORDER — SHINGRIX 50 MCG/0.5ML IM SUSR
INTRAMUSCULAR | 1 refills | Status: DC
  Filled 2021-04-04: qty 1, 1d supply, fill #0
  Filled 2021-07-06: qty 1, 1d supply, fill #1

## 2021-04-04 MED ORDER — SEMAGLUTIDE-WEIGHT MANAGEMENT 0.25 MG/0.5ML ~~LOC~~ SOAJ: 0.2500 mg | SUBCUTANEOUS | 0 refills | Status: AC

## 2021-04-04 MED ORDER — SEMAGLUTIDE-WEIGHT MANAGEMENT 1.7 MG/0.75ML ~~LOC~~ SOAJ: 1.7000 mg | SUBCUTANEOUS | 0 refills | Status: AC

## 2021-04-04 MED ORDER — SEMAGLUTIDE-WEIGHT MANAGEMENT 0.5 MG/0.5ML ~~LOC~~ SOAJ: 0.5000 mg | SUBCUTANEOUS | 0 refills | Status: AC

## 2021-04-04 MED ORDER — SEMAGLUTIDE-WEIGHT MANAGEMENT 1 MG/0.5ML ~~LOC~~ SOAJ: 1.0000 mg | SUBCUTANEOUS | 0 refills | Status: AC

## 2021-04-04 MED ORDER — SEMAGLUTIDE-WEIGHT MANAGEMENT 2.4 MG/0.75ML ~~LOC~~ SOAJ: 2.4000 mg | SUBCUTANEOUS | 6 refills | Status: AC

## 2021-04-04 NOTE — Patient Instructions (Signed)
Great to see you again today!  I will be in touch with your labs I do suggest getting Shingrix asap  We can start you on wegovy for weight loss- so you can get your knee done!  Let me know if you are not able to afford this medication or if not covered.  Dose weekly, increase each month until goal dose

## 2021-04-05 ENCOUNTER — Other Ambulatory Visit (INDEPENDENT_AMBULATORY_CARE_PROVIDER_SITE_OTHER): Payer: Medicare Other

## 2021-04-05 DIAGNOSIS — R748 Abnormal levels of other serum enzymes: Secondary | ICD-10-CM

## 2021-04-05 LAB — GAMMA GT: GGT: 113 U/L — ABNORMAL HIGH (ref 7–51)

## 2021-05-02 ENCOUNTER — Other Ambulatory Visit: Payer: Self-pay | Admitting: Family Medicine

## 2021-05-02 DIAGNOSIS — F341 Dysthymic disorder: Secondary | ICD-10-CM

## 2021-05-13 ENCOUNTER — Other Ambulatory Visit: Payer: Self-pay | Admitting: *Deleted

## 2021-05-13 DIAGNOSIS — Z1239 Encounter for other screening for malignant neoplasm of breast: Secondary | ICD-10-CM
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
## 2021-05-16 ENCOUNTER — Inpatient Hospital Stay: Payer: Medicare Other | Attending: Oncology | Admitting: Oncology

## 2021-05-16 ENCOUNTER — Other Ambulatory Visit: Payer: Self-pay

## 2021-05-16 ENCOUNTER — Inpatient Hospital Stay: Payer: Medicare Other

## 2021-05-16 VITALS — BP 126/56 | HR 60 | Temp 97.7°F | Resp 18 | Ht 66.0 in | Wt 279.0 lb

## 2021-05-16 DIAGNOSIS — Z90710 Acquired absence of both cervix and uterus: Secondary | ICD-10-CM | POA: Insufficient documentation

## 2021-05-16 DIAGNOSIS — Z800 Family history of malignant neoplasm of digestive organs: Secondary | ICD-10-CM | POA: Diagnosis not present

## 2021-05-16 DIAGNOSIS — Z8542 Personal history of malignant neoplasm of other parts of uterus: Secondary | ICD-10-CM | POA: Diagnosis not present

## 2021-05-16 DIAGNOSIS — Z1239 Encounter for other screening for malignant neoplasm of breast: Secondary | ICD-10-CM

## 2021-05-16 DIAGNOSIS — Z87891 Personal history of nicotine dependence: Secondary | ICD-10-CM | POA: Diagnosis not present

## 2021-05-16 DIAGNOSIS — Z90722 Acquired absence of ovaries, bilateral: Secondary | ICD-10-CM | POA: Insufficient documentation

## 2021-05-16 DIAGNOSIS — C541 Malignant neoplasm of endometrium: Secondary | ICD-10-CM | POA: Diagnosis not present

## 2021-05-16 DIAGNOSIS — E039 Hypothyroidism, unspecified: Secondary | ICD-10-CM | POA: Insufficient documentation

## 2021-05-16 DIAGNOSIS — Z79899 Other long term (current) drug therapy: Secondary | ICD-10-CM | POA: Diagnosis not present

## 2021-05-16 DIAGNOSIS — Z1231 Encounter for screening mammogram for malignant neoplasm of breast: Secondary | ICD-10-CM | POA: Diagnosis not present

## 2021-05-16 DIAGNOSIS — Z1501 Genetic susceptibility to malignant neoplasm of breast: Secondary | ICD-10-CM | POA: Insufficient documentation

## 2021-05-16 DIAGNOSIS — D0590 Unspecified type of carcinoma in situ of unspecified breast: Secondary | ICD-10-CM | POA: Diagnosis not present

## 2021-05-16 DIAGNOSIS — Z9079 Acquired absence of other genital organ(s): Secondary | ICD-10-CM | POA: Insufficient documentation

## 2021-05-16 DIAGNOSIS — Z803 Family history of malignant neoplasm of breast: Secondary | ICD-10-CM | POA: Diagnosis not present

## 2021-05-16 LAB — CMP (CANCER CENTER ONLY)
ALT: 44 U/L (ref 0–44)
AST: 30 U/L (ref 15–41)
Albumin: 3.9 g/dL (ref 3.5–5.0)
Alkaline Phosphatase: 168 U/L — ABNORMAL HIGH (ref 38–126)
Anion gap: 11 (ref 5–15)
BUN: 23 mg/dL (ref 8–23)
CO2: 29 mmol/L (ref 22–32)
Calcium: 10.3 mg/dL (ref 8.9–10.3)
Chloride: 101 mmol/L (ref 98–111)
Creatinine: 0.81 mg/dL (ref 0.44–1.00)
GFR, Estimated: >60 mL/min (ref >60)
Glucose, Bld: 89 mg/dL (ref 70–99)
Potassium: 4.3 mmol/L (ref 3.5–5.1)
Sodium: 141 mmol/L (ref 135–145)
Total Bilirubin: 0.5 mg/dL (ref 0.3–1.2)
Total Protein: 7.6 g/dL (ref 6.5–8.1)

## 2021-05-16 LAB — CBC WITH DIFFERENTIAL (CANCER CENTER ONLY)
Abs Immature Granulocytes: 0.03 10*3/uL (ref 0.00–0.07)
Basophils Absolute: 0 10*3/uL (ref 0.0–0.1)
Basophils Relative: 0 %
Eosinophils Absolute: 0.1 10*3/uL (ref 0.0–0.5)
Eosinophils Relative: 1 %
HCT: 39.5 % (ref 36.0–46.0)
Hemoglobin: 12.9 g/dL (ref 12.0–15.0)
Immature Granulocytes: 0 %
Lymphocytes Relative: 18 %
Lymphs Abs: 1.5 10*3/uL (ref 0.7–4.0)
MCH: 31.5 pg (ref 26.0–34.0)
MCHC: 32.7 g/dL (ref 30.0–36.0)
MCV: 96.6 fL (ref 80.0–100.0)
Monocytes Absolute: 0.4 10*3/uL (ref 0.1–1.0)
Monocytes Relative: 5 %
Neutro Abs: 6.1 10*3/uL (ref 1.7–7.7)
Neutrophils Relative %: 76 %
Platelet Count: 227 10*3/uL (ref 150–400)
RBC: 4.09 MIL/uL (ref 3.87–5.11)
RDW: 12.4 % (ref 11.5–15.5)
WBC Count: 8.1 10*3/uL (ref 4.0–10.5)
nRBC: 0 % (ref 0.0–0.2)

## 2021-05-23 ENCOUNTER — Other Ambulatory Visit: Payer: Self-pay | Admitting: Family Medicine

## 2021-05-23 DIAGNOSIS — G6289 Other specified polyneuropathies: Secondary | ICD-10-CM

## 2021-05-31 ENCOUNTER — Other Ambulatory Visit (HOSPITAL_BASED_OUTPATIENT_CLINIC_OR_DEPARTMENT_OTHER): Payer: Self-pay

## 2021-05-31 ENCOUNTER — Ambulatory Visit: Payer: Medicare Other | Attending: Internal Medicine

## 2021-05-31 DIAGNOSIS — Z23 Encounter for immunization: Secondary | ICD-10-CM

## 2021-05-31 MED ORDER — INFLUENZA VAC A&B SA ADJ QUAD 0.5 ML IM PRSY
PREFILLED_SYRINGE | INTRAMUSCULAR | 0 refills | Status: DC
  Filled 2021-05-31: qty 0.5, 1d supply, fill #0

## 2021-05-31 NOTE — Progress Notes (Signed)
   Covid-19 Vaccination Clinic  Name:  RASHI GRANIER    MRN: [id number] DOB: 16-Jul-1946  05/31/2021  Ms. Maiorana was observed post Covid-19 immunization for 15 minutes without incident. She was provided with Vaccine Information Sheet and instruction to access the V-Safe system.   Ms. Kehres was instructed to call 911 with any severe reactions post vaccine: Difficulty breathing  Swelling of face and throat  A fast heartbeat  A bad rash all over body  Dizziness and weakness

## 2021-06-09 ENCOUNTER — Other Ambulatory Visit (HOSPITAL_BASED_OUTPATIENT_CLINIC_OR_DEPARTMENT_OTHER): Payer: Self-pay

## 2021-06-09 DIAGNOSIS — Z23 Encounter for immunization: Secondary | ICD-10-CM | POA: Diagnosis not present

## 2021-06-09 MED ORDER — COVID-19MRNA BIVAL VACC PFIZER 30 MCG/0.3ML IM SUSP
INTRAMUSCULAR | 0 refills | Status: DC
  Filled 2021-06-09: qty 0.3, 1d supply, fill #0

## 2021-06-18 ENCOUNTER — Emergency Department (INDEPENDENT_AMBULATORY_CARE_PROVIDER_SITE_OTHER)
Admission: EM | Admit: 2021-06-18 | Discharge: 2021-06-18 | Disposition: A | Discharge status: Home / Self Care | Payer: Medicare Other | Source: Home / Self Care | Attending: Family Medicine | Admitting: Family Medicine

## 2021-06-18 ENCOUNTER — Encounter: Payer: Self-pay | Admitting: Emergency Medicine

## 2021-06-18 ENCOUNTER — Other Ambulatory Visit: Payer: Self-pay

## 2021-06-18 DIAGNOSIS — R300 Dysuria: Secondary | ICD-10-CM | POA: Diagnosis not present

## 2021-06-18 LAB — POCT URINALYSIS DIP (MANUAL ENTRY)
Bilirubin, UA: NEGATIVE
Blood, UA: NEGATIVE
Glucose, UA: NEGATIVE mg/dL
Ketones, POC UA: NEGATIVE mg/dL
Nitrite, UA: NEGATIVE
Protein Ur, POC: NEGATIVE mg/dL
Spec Grav, UA: <=1.005 — AB (ref 1.010–1.025)
Urobilinogen, UA: 0.2 E.U./dL
pH, UA: 5 (ref 5.0–8.0)

## 2021-06-18 MED ORDER — CEPHALEXIN 500 MG PO CAPS: 500.0000 mg | ORAL_CAPSULE | Freq: Two times a day (BID) | ORAL | 0 refills | Status: DC

## 2021-06-18 NOTE — Discharge Instructions (Signed)
Make sure that you are drinking lots of water Take the Pyridium for urinary discomfort Take antibiotic 2 times a day for a week  You will be called if any change in antibiotic is necessary

## 2021-06-18 NOTE — ED Triage Notes (Signed)
Patient c/o urinary frequency, pain after finishing urinating w/cramping, doesn't feel like she's emptying completely since last night.  No hematuria.
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
## 2021-06-21 DIAGNOSIS — H04123 Dry eye syndrome of bilateral lacrimal glands: Secondary | ICD-10-CM | POA: Diagnosis not present

## 2021-06-21 DIAGNOSIS — H26491 Other secondary cataract, right eye: Secondary | ICD-10-CM | POA: Diagnosis not present

## 2021-06-21 LAB — URINE CULTURE
MICRO NUMBER:: 12570388
SPECIMEN QUALITY:: ADEQUATE

## 2021-06-30 ENCOUNTER — Other Ambulatory Visit (HOSPITAL_COMMUNITY): Payer: Self-pay

## 2021-07-06 ENCOUNTER — Other Ambulatory Visit (HOSPITAL_BASED_OUTPATIENT_CLINIC_OR_DEPARTMENT_OTHER): Payer: Self-pay

## 2021-07-11 DIAGNOSIS — R922 Inconclusive mammogram: Secondary | ICD-10-CM | POA: Diagnosis not present

## 2021-07-11 DIAGNOSIS — R921 Mammographic calcification found on diagnostic imaging of breast: Secondary | ICD-10-CM | POA: Diagnosis not present

## 2021-07-11 DIAGNOSIS — R928 Other abnormal and inconclusive findings on diagnostic imaging of breast: Secondary | ICD-10-CM | POA: Diagnosis not present

## 2021-07-12 ENCOUNTER — Encounter: Payer: Self-pay | Admitting: Obstetrics and Gynecology

## 2021-07-20 ENCOUNTER — Telehealth: Payer: Self-pay | Admitting: Pulmonary Disease

## 2021-07-20 MED ORDER — BUDESONIDE-FORMOTEROL FUMARATE 160-4.5 MCG/ACT IN AERO: 2.0000 | INHALATION_SPRAY | Freq: Two times a day (BID) | RESPIRATORY_TRACT | 2 refills | Status: DC

## 2021-07-20 NOTE — Telephone Encounter (Signed)
I called to let patient know that I had sent in a refill of her Symbicort and she will need to make a OV for further refills since she has not seen Dr. Halford Chessman in almost a year.   She will need OV first available.

## 2021-07-30 ENCOUNTER — Emergency Department (INDEPENDENT_AMBULATORY_CARE_PROVIDER_SITE_OTHER)
Admission: RE | Admit: 2021-07-30 | Discharge: 2021-07-30 | Disposition: A | Discharge status: Home / Self Care | Payer: Medicare Other | Source: Ambulatory Visit | Attending: Family Medicine | Admitting: Family Medicine

## 2021-07-30 ENCOUNTER — Other Ambulatory Visit: Payer: Self-pay

## 2021-07-30 VITALS — BP 133/72 | HR 70 | Temp 97.7°F | Resp 18

## 2021-07-30 DIAGNOSIS — R300 Dysuria: Secondary | ICD-10-CM

## 2021-07-30 LAB — POCT URINALYSIS DIP (MANUAL ENTRY)
Bilirubin, UA: NEGATIVE
Blood, UA: NEGATIVE
Glucose, UA: NEGATIVE mg/dL
Ketones, POC UA: NEGATIVE mg/dL
Nitrite, UA: NEGATIVE
Protein Ur, POC: NEGATIVE mg/dL
Spec Grav, UA: 1.025 (ref 1.010–1.025)
Urobilinogen, UA: 0.2 E.U./dL
pH, UA: 5.5 (ref 5.0–8.0)

## 2021-07-30 MED ORDER — CEFDINIR 300 MG PO CAPS: 300.0000 mg | ORAL_CAPSULE | Freq: Two times a day (BID) | ORAL | 0 refills | Status: DC
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
## 2021-07-30 NOTE — ED Triage Notes (Signed)
Pt present urinary frequency with lower abdomen cramps and burning sensation while urinating. Symptom started yesterday around noon.

## 2021-07-30 NOTE — Discharge Instructions (Signed)
Take the antibiotic 2 times a day for 1 week Drink lots of water see your primary care doctor in follow-up  Urine culture report will be available on MyChart in 2 to 3 days

## 2021-08-02 LAB — URINE CULTURE
MICRO NUMBER:: 12742497
SPECIMEN QUALITY:: ADEQUATE

## 2021-08-04 ENCOUNTER — Other Ambulatory Visit: Payer: Self-pay | Admitting: Family Medicine

## 2021-08-04 DIAGNOSIS — G6289 Other specified polyneuropathies: Secondary | ICD-10-CM

## 2021-08-09 DIAGNOSIS — E039 Hypothyroidism, unspecified: Secondary | ICD-10-CM | POA: Diagnosis not present

## 2021-08-09 DIAGNOSIS — R7309 Other abnormal glucose: Secondary | ICD-10-CM | POA: Diagnosis not present

## 2021-08-09 LAB — COMPREHENSIVE METABOLIC PANEL
Calcium: 9.5 (ref 8.7–10.7)
GFR calc Af Amer: 79
GFR calc non Af Amer: 68

## 2021-08-09 LAB — BASIC METABOLIC PANEL
BUN: 20 (ref 4–21)
CO2: 34 — AB (ref 13–22)
Chloride: 100 (ref 99–108)
Creatinine: 0.8 (ref 0.5–1.1)
Glucose: 81
Potassium: 4.1 (ref 3.4–5.3)
Sodium: 142 (ref 137–147)

## 2021-08-09 LAB — TSH: TSH: 5.12 (ref 0.41–5.90)

## 2021-08-09 LAB — HEPATIC FUNCTION PANEL
ALT: 59 — AB (ref 7–35)
AST: 37 — AB (ref 13–35)
Alkaline Phosphatase: 192 — AB (ref 25–125)
Bilirubin, Total: 0.2

## 2021-08-09 LAB — HEMOGLOBIN A1C: Hemoglobin A1C: 5.6

## 2021-08-16 DIAGNOSIS — R5382 Chronic fatigue, unspecified: Secondary | ICD-10-CM | POA: Diagnosis not present

## 2021-08-16 DIAGNOSIS — R7309 Other abnormal glucose: Secondary | ICD-10-CM | POA: Diagnosis not present

## 2021-08-16 DIAGNOSIS — Z6841 Body Mass Index (BMI) 40.0 and over, adult: Secondary | ICD-10-CM | POA: Diagnosis not present

## 2021-08-16 DIAGNOSIS — R7301 Impaired fasting glucose: Secondary | ICD-10-CM | POA: Diagnosis not present

## 2021-08-16 DIAGNOSIS — Z8639 Personal history of other endocrine, nutritional and metabolic disease: Secondary | ICD-10-CM | POA: Diagnosis not present

## 2021-08-16 DIAGNOSIS — E039 Hypothyroidism, unspecified: Secondary | ICD-10-CM | POA: Diagnosis not present

## 2021-08-16 DIAGNOSIS — Z713 Dietary counseling and surveillance: Secondary | ICD-10-CM | POA: Diagnosis not present

## 2021-08-31 ENCOUNTER — Ambulatory Visit (INDEPENDENT_AMBULATORY_CARE_PROVIDER_SITE_OTHER): Payer: Medicare Other

## 2021-08-31 ENCOUNTER — Other Ambulatory Visit: Payer: Self-pay

## 2021-08-31 DIAGNOSIS — J42 Unspecified chronic bronchitis: Secondary | ICD-10-CM | POA: Diagnosis not present

## 2021-08-31 DIAGNOSIS — J849 Interstitial pulmonary disease, unspecified: Secondary | ICD-10-CM | POA: Diagnosis not present

## 2021-08-31 DIAGNOSIS — I2510 Atherosclerotic heart disease of native coronary artery without angina pectoris: Secondary | ICD-10-CM | POA: Diagnosis not present

## 2021-08-31 DIAGNOSIS — J432 Centrilobular emphysema: Secondary | ICD-10-CM | POA: Diagnosis not present

## 2021-08-31 DIAGNOSIS — J929 Pleural plaque without asbestos: Secondary | ICD-10-CM | POA: Diagnosis not present
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
## 2021-09-04 NOTE — Patient Instructions (Addendum)
It was good to see you again today I would suggest getting the shingles vaccine at your pharmacy-Shingrix-if not done already For weight loss- let's try Ozempic (same as [place] but your insurance prefers) once weekly Start with 0.25 for 4 weeks, then try increasing to 0.5.  Please let me know how this works for you We can increase the dose as needed   Please let me know if your mood is not improving- we can try a medication change if needed

## 2021-09-05 ENCOUNTER — Ambulatory Visit: Payer: Medicare Other | Admitting: Family Medicine

## 2021-09-07 ENCOUNTER — Ambulatory Visit (INDEPENDENT_AMBULATORY_CARE_PROVIDER_SITE_OTHER): Payer: Medicare Other | Admitting: Family Medicine

## 2021-09-07 DIAGNOSIS — I890 Lymphedema, not elsewhere classified: Secondary | ICD-10-CM | POA: Diagnosis not present

## 2021-09-07 DIAGNOSIS — G6289 Other specified polyneuropathies: Secondary | ICD-10-CM | POA: Diagnosis not present

## 2021-09-07 DIAGNOSIS — I10 Essential (primary) hypertension: Secondary | ICD-10-CM

## 2021-09-07 DIAGNOSIS — F330 Major depressive disorder, recurrent, mild: Secondary | ICD-10-CM

## 2021-09-07 DIAGNOSIS — E782 Mixed hyperlipidemia: Secondary | ICD-10-CM

## 2021-09-07 DIAGNOSIS — E039 Hypothyroidism, unspecified: Secondary | ICD-10-CM

## 2021-09-07 MED ORDER — FUROSEMIDE 80 MG PO TABS: ORAL_TABLET | ORAL | 3 refills | Status: DC

## 2021-09-07 MED ORDER — PREGABALIN 100 MG PO CAPS: ORAL_CAPSULE | ORAL | 5 refills | Status: DC

## 2021-09-07 MED ORDER — OZEMPIC (0.25 OR 0.5 MG/DOSE) 2 MG/1.5ML ~~LOC~~ SOPN: PEN_INJECTOR | SUBCUTANEOUS | 3 refills | Status: DC

## 2021-09-07 MED ORDER — TRAMADOL HCL 50 MG PO TABS: ORAL_TABLET | ORAL | 2 refills | Status: DC

## 2021-09-07 MED ORDER — ATORVASTATIN CALCIUM 20 MG PO TABS: ORAL_TABLET | ORAL | 3 refills | Status: DC

## 2021-09-07 MED ORDER — LOSARTAN POTASSIUM 50 MG PO TABS: ORAL_TABLET | ORAL | 3 refills | Status: DC

## 2021-09-12 DIAGNOSIS — M25561 Pain in right knee: Secondary | ICD-10-CM | POA: Diagnosis not present

## 2021-09-12 DIAGNOSIS — M25562 Pain in left knee: Secondary | ICD-10-CM | POA: Diagnosis not present

## 2021-09-14 ENCOUNTER — Other Ambulatory Visit: Payer: Self-pay

## 2021-09-14 ENCOUNTER — Telehealth: Payer: Self-pay | Admitting: Family Medicine

## 2021-09-14 MED ORDER — OZEMPIC (0.25 OR 0.5 MG/DOSE) 2 MG/1.5ML ~~LOC~~ SOPN: PEN_INJECTOR | SUBCUTANEOUS | 3 refills | Status: DC

## 2021-09-14 NOTE — Telephone Encounter (Signed)
Patient states her [facility] pharmacy told her they never received her Ozempic medication scrip. Patient would like the scrip to be resent to the pharmacy. Please advice.

## 2021-09-14 NOTE — Telephone Encounter (Signed)
Original rx had ben sent to [facility] (downstairs). Resent to [facility].

## 2021-09-20 ENCOUNTER — Encounter: Payer: Self-pay | Admitting: Pulmonary Disease

## 2021-09-20 ENCOUNTER — Ambulatory Visit (INDEPENDENT_AMBULATORY_CARE_PROVIDER_SITE_OTHER): Payer: Medicare Other | Admitting: Pulmonary Disease

## 2021-09-20 ENCOUNTER — Other Ambulatory Visit: Payer: Self-pay

## 2021-09-20 VITALS — BP 130/80 | HR 79 | Temp 98.2°F | Ht 67.0 in | Wt 286.8 lb

## 2021-09-20 DIAGNOSIS — J301 Allergic rhinitis due to pollen: Secondary | ICD-10-CM | POA: Diagnosis not present

## 2021-09-20 DIAGNOSIS — G4733 Obstructive sleep apnea (adult) (pediatric): Secondary | ICD-10-CM

## 2021-09-20 DIAGNOSIS — J849 Interstitial pulmonary disease, unspecified: Secondary | ICD-10-CM | POA: Diagnosis not present

## 2021-09-20 DIAGNOSIS — J432 Centrilobular emphysema: Secondary | ICD-10-CM

## 2021-09-20 MED ORDER — BUDESONIDE-FORMOTEROL FUMARATE 80-4.5 MCG/ACT IN AERO: 2.0000 | INHALATION_SPRAY | Freq: Two times a day (BID) | RESPIRATORY_TRACT | 12 refills | Status: DC

## 2021-09-20 NOTE — Patient Instructions (Signed)
Change symbicort to 80 two puffs twice per day and rinse your mouth after each use  Follow up in 6 months

## 2021-09-20 NOTE — Progress Notes (Signed)
[facility]  Chief Complaint  Patient presents with   Follow-up    COPD    Past Surgical History:  She  has a past surgical history that includes Dilation and curettage of uterus; Back surgery; Tonsillectomy; Wisdom tooth extraction; Right colectomy (2008); colonscopy (12/09/11); Hysteroscopy with D & C (05/29/2011); Urethrotomy (1984); Hysteroscopy; Polypectomy; Excisional hemorrhoidectomy; Cholecystectomy; Laparoscopic cholecystectomy; Dilatation & currettage/hysteroscopy with resectoscope (N/A, 03/03/2013); Robotic assisted total hysterectomy with bilateral salpingo oophorectomy (Bilateral, 04/01/2013); Colectomy; and Breast lumpectomy with radioactive seed localization (Left, 04/27/2015).  Past Medical History:  Anxiety, Depression, OA, Back pain, Endometrial cancer, Fibromyalgia, GERD, HLD, HTN, Graves disease, Hypothyroidism, IBS, Neuropathy, Fatty liver  Constitutional:  BP 130/80 (BP Location: Left Arm, Cuff Size: Normal)    Pulse 79    Temp 98.2 F (36.8 C) (Oral)    Ht 5\' 7"  (1.702 m)    Wt 286 lb 12.8 oz (130.1 kg)    LMP 11/20/2011 (Approximate)    SpO2 96%    BMI 44.92 kg/m   Brief Summary:  Shelly Mosley is a 76 y.o. female former smoker with emphysema, ILD, chronic bronchitis, OSA.      Subjective:   PFT from February was essentially normal.  HRCT from January showed stable changes of mild emphysema, mild ILD, and benign nodules.  She was told she has fatty liver causing cirrhosis.  Not having much cough, wheeze, or sputum.  Gets winded if she does too much.  Otherwise breathing and sleep okay.  Physical Exam:   Appearance - well kempt   ENMT - no sinus tenderness, no oral exudate, no LAN, Mallampati 3 airway, no stridor  Respiratory - equal breath sounds bilaterally, no wheezing or rales  CV - s1s2 regular rate and rhythm, no murmurs  Ext - no clubbing, no edema  Skin - no rashes  Psych - normal mood and affect    Pulmonary testing:  PFT 09/19/19 >> FEV1 1.82 (75%), FEV1% 76, TLC 5.02 (92%), DLCO 98%, No BD Serology 01/26/20 >> ANA positive 1:640 homogenous pattern A1AT 01/26/20 >> 192, MM PFT 10/11/20 >> FEV1 1.68 (70%), FEV1% 76, TLC 4.83 (88%), DLCO 102%  Chest Imaging:  HRCT chest 07/14/19 >> atherosclerosis, mild centrilobular and paraseptal emphysema, minimal peripheral and subpleural GGO, scarring RLL, scattered peripheral nodules up to 4 mm (benign) HRCT chest 08/31/21 >> mild emphysema, 3 mm LUL nodule, 4 mm RML nodule, mild patchy subpleural reticulation and GGO (nonprogessive NSIP or atypical UIP), cirrhosis  Sleep Tests:  HST 09/10/19 >> AHI 11.8, SpO2 low 67%  Cardiac Tests:  Echo 12/29/20 >> EF 50 to 55%, grade 1 DD  Social History:  She  reports that she quit smoking about 8 years ago. Her smoking use included cigarettes. She has a 42.00 pack-year smoking history. She has never used smokeless tobacco. She reports current alcohol use. She reports that she does not use drugs.  Family History:  Her family history includes Breast cancer in her maternal grandmother; Breast cancer (age of onset: 72) in her maternal aunt and sister; Breast cancer (age of onset: 52) in her mother; Colon cancer in her maternal aunt; Diabetes in her brother and mother; Heart failure in her father; Hypertension in her brother and sister; Thyroid disease in her mother.     Assessment/Plan:   ILD with NSIP pattern with positive ANA. - stable on most recent CT chest - monitor clinical and repeat testing if symptoms progress   Chronic bronchitis with  mild emphysema. - no significant obstruction on recent PFT - change symbicort to 80 bid to see if this has less eye irritation - LAMA's caused ophthalmologic side effects, singulair caused GI side effects - prn albuterol - discussed different roles for her inhalers   Allergic rhinitis. - prn zyrtec, neti pot   Obstructive sleep apnea. - never tried on CPAP -  feels sleep is okay - asked her to monitor her sleep pattern   Obesity. - discussed importance of weight loss    Time Spent Involved in Patient Care on Day of Examination:  36 minutes  Follow up:   Patient Instructions  Change symbicort to 80 two puffs twice per day and rinse your mouth after each use  Follow up in 6 months  Medication List:   Allergies as of 09/20/2021       Reactions   Chloroxylenol (antiseptic) Rash   Ciprofloxacin Hcl Hives   Codeine Swelling   Swollen lips.  Pt has taken vicoden w/o problems   Advil [ibuprofen] Rash   Amoxicillin-pot Clavulanate Diarrhea   Pt had bad diarrhea. Note: Can take cephalexin without any problems.   Nsaids Swelling, Rash   Rash and itching.   Statins Other (See Comments)   Increased LFTs- pt currently tolerating low dose Lavalo   Sulfa Antibiotics Rash   Tolmetin Rash, Swelling   Rash and itching.        Medication List        Accurate as of September 20, 2021  9:43 AM. If you have any questions, ask your nurse or doctor.          STOP taking these medications    budesonide-formoterol 160-4.5 MCG/ACT inhaler Commonly known as: SYMBICORT Replaced by: budesonide-formoterol 80-4.5 MCG/ACT inhaler Stopped by: Chesley Mires, MD       TAKE these medications    albuterol 108 (90 Base) MCG/ACT inhaler Commonly known as: ProAir HFA Inhale 1 puff into the lungs every 6 (six) hours as needed for wheezing or shortness of breath.   thyroid 30 MG tablet Commonly known as: ARMOUR Take 30 mg by mouth daily before breakfast.   Armour Thyroid 120 MG tablet Generic drug: thyroid Take 1 tablet by mouth daily. Along with Thyroid 30mg  for total of 150mg  once a day   atorvastatin 20 MG tablet Commonly known as: LIPITOR TAKE 1 TABLET(20 MG) BY MOUTH DAILY   budesonide-formoterol 80-4.5 MCG/ACT inhaler Commonly known as: Symbicort Inhale 2 puffs into the lungs 2 (two) times daily. Replaces: budesonide-formoterol  160-4.5 MCG/ACT inhaler Started by: Chesley Mires, MD   cetirizine 10 MG tablet Commonly known as: ZYRTEC Take 10 mg by mouth daily.   CRANBERRY FRUIT PO Take by mouth.   diclofenac Sodium 1 % Gel Commonly known as: VOLTAREN Apply topically 4 (four) times daily.   furosemide 80 MG tablet Commonly known as: LASIX TAKE 1 TABLET(80 MG) BY MOUTH DAILY   Hypromellose 0.3 % Soln Place 1 drop into both eyes at bedtime.   losartan 50 MG tablet Commonly known as: COZAAR TAKE 1 TABLET(50 MG) BY MOUTH TWICE DAILY   Medical Compression Stockings Misc 1 application by Does not apply route daily.   multivitamin tablet Take 1 tablet by mouth daily.   nystatin powder Commonly known as: MYCOSTATIN/NYSTOP Apply 1 application topically 3 (three) times daily. Apply to affected area for up to 7 days   Omega 3 1200 MG Caps Take 1 capsule by mouth 2 (two) times daily.   Ozempic (0.25  or 0.5 MG/DOSE) 2 MG/1.5ML Sopn Generic drug: Semaglutide(0.25 or 0.5MG /DOS) Inject 0.25 mg [place] once weekly for 4 weeks, then increase to 0.5 mg weekly   pregabalin 100 MG capsule Commonly known as: LYRICA TAKE 1 CAPSULE(100 MG) BY MOUTH THREE TIMES DAILY   traMADol 50 MG tablet Commonly known as: ULTRAM Take one tablet by mouth TID as needed for knee pain   Ultra Fresh PM Oint Apply to eye.   venlafaxine 50 MG tablet Commonly known as: EFFEXOR TAKE 1 TABLET(50 MG) BY MOUTH TWICE DAILY WITH A MEAL   Vitamin D-3 125 MCG (5000 UT) Tabs Take 1 tablet by mouth daily.        Signature:  Chesley Mires, MD [place] Pager - [phone] 09/20/2021, 9:43 AM

## 2021-09-22 ENCOUNTER — Telehealth: Payer: Self-pay | Admitting: Pulmonary Disease

## 2021-09-22 MED ORDER — ALBUTEROL SULFATE HFA 108 (90 BASE) MCG/ACT IN AERS: 1.0000 | INHALATION_SPRAY | Freq: Four times a day (QID) | RESPIRATORY_TRACT | 2 refills | Status: DC | PRN

## 2021-09-22 MED ORDER — BUDESONIDE-FORMOTEROL FUMARATE 80-4.5 MCG/ACT IN AERO: 2.0000 | INHALATION_SPRAY | Freq: Two times a day (BID) | RESPIRATORY_TRACT | 12 refills | Status: DC

## 2021-09-22 NOTE — Telephone Encounter (Signed)
Symbicort and albuterol sent to preferred pharmacy. Patient is aware and voiced her understanding.  Nothing further needed.

## 2021-09-25 ENCOUNTER — Other Ambulatory Visit: Payer: Self-pay | Admitting: Family Medicine

## 2021-09-25 DIAGNOSIS — E782 Mixed hyperlipidemia: Secondary | ICD-10-CM

## 2021-09-27 DIAGNOSIS — H26491 Other secondary cataract, right eye: Secondary | ICD-10-CM | POA: Diagnosis not present

## 2021-09-30 DIAGNOSIS — H26491 Other secondary cataract, right eye: Secondary | ICD-10-CM | POA: Insufficient documentation

## 2021-10-06 ENCOUNTER — Emergency Department (INDEPENDENT_AMBULATORY_CARE_PROVIDER_SITE_OTHER): Payer: Medicare Other

## 2021-10-06 ENCOUNTER — Other Ambulatory Visit: Payer: Self-pay

## 2021-10-06 ENCOUNTER — Emergency Department (INDEPENDENT_AMBULATORY_CARE_PROVIDER_SITE_OTHER)
Admission: EM | Admit: 2021-10-06 | Discharge: 2021-10-06 | Disposition: A | Discharge status: Home / Self Care | Payer: Medicare Other | Source: Home / Self Care

## 2021-10-06 ENCOUNTER — Ambulatory Visit: Payer: Self-pay

## 2021-10-06 DIAGNOSIS — S92321B Displaced fracture of second metatarsal bone, right foot, initial encounter for open fracture: Secondary | ICD-10-CM | POA: Diagnosis not present

## 2021-10-06 DIAGNOSIS — M79671 Pain in right foot: Secondary | ICD-10-CM

## 2021-10-06 MED ORDER — HYDROCODONE-ACETAMINOPHEN 5-325 MG PO TABS: 1.0000 | ORAL_TABLET | Freq: Four times a day (QID) | ORAL | 0 refills | Status: DC | PRN
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
## 2021-10-06 NOTE — Discharge Instructions (Addendum)
Advised patient of right foot x-ray results: Mildly displaced proximal second metatarsal fracture.  Advised patient to wear cam walker boot 24/7 (except when bathing and sleeping) until evaluated by orthopedic provider.  [facility] orthopedic provider contact information has been provided with this AVS.  Advised patient to make appointment with this provider NOW for further evaluation and fracture management.

## 2021-10-06 NOTE — ED Triage Notes (Signed)
Pt here today for RT foot pain since injuring yesterday while at home. Can't bare weight on it. Ice and tylenol prn.

## 2021-10-11 ENCOUNTER — Ambulatory Visit (INDEPENDENT_AMBULATORY_CARE_PROVIDER_SITE_OTHER): Payer: Medicare Other | Admitting: Family Medicine

## 2021-10-11 ENCOUNTER — Encounter: Payer: Self-pay | Admitting: Family Medicine

## 2021-10-11 VITALS — BP 118/76 | Ht 67.0 in | Wt 286.0 lb

## 2021-10-11 DIAGNOSIS — S92321A Displaced fracture of second metatarsal bone, right foot, initial encounter for closed fracture: Secondary | ICD-10-CM | POA: Diagnosis not present

## 2021-10-11 DIAGNOSIS — S93621A Sprain of tarsometatarsal ligament of right foot, initial encounter: Secondary | ICD-10-CM | POA: Insufficient documentation

## 2021-10-11 NOTE — Patient Instructions (Signed)
Nice to meet you Please use ice as needed  Please try to elevate   Please send me a message in MyChart with any questions or updates.  Please see me back as needed.   --Dr. Raeford Razor
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
## 2021-10-11 NOTE — Assessment & Plan Note (Signed)
Acutely occurring.  Initial injury on 2/16.  Has mild displacement at the base of the second metatarsal. -Counseled on home exercise therapy and supportive care. -Referral to orthopedics. -Counseled on nonweightbearing.

## 2021-10-11 NOTE — Assessment & Plan Note (Signed)
Acutely occurring.  Injury occurred on 2/16.  Having change at the base of the second metatarsal with concern for Lisfranc injury. -Counseled on nonweightbearing. -Referral to orthopedics.

## 2021-10-24 DIAGNOSIS — M79671 Pain in right foot: Secondary | ICD-10-CM | POA: Diagnosis not present

## 2021-10-24 DIAGNOSIS — S92321A Displaced fracture of second metatarsal bone, right foot, initial encounter for closed fracture: Secondary | ICD-10-CM | POA: Diagnosis not present

## 2021-10-26 ENCOUNTER — Other Ambulatory Visit: Payer: Self-pay | Admitting: Family Medicine

## 2021-10-26 DIAGNOSIS — I10 Essential (primary) hypertension: Secondary | ICD-10-CM

## 2021-10-26 DIAGNOSIS — F341 Dysthymic disorder: Secondary | ICD-10-CM

## 2021-10-27 ENCOUNTER — Ambulatory Visit: Payer: Medicare Other
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
## 2021-10-28 ENCOUNTER — Ambulatory Visit (INDEPENDENT_AMBULATORY_CARE_PROVIDER_SITE_OTHER): Payer: Medicare Other

## 2021-10-28 DIAGNOSIS — Z0000 Encounter for general adult medical examination without abnormal findings: Secondary | ICD-10-CM

## 2021-10-28 NOTE — Patient Instructions (Signed)
Shelly Mosley , Thank you for taking time to come for your Medicare Wellness Visit. I appreciate your ongoing commitment to your health goals. Please review the following plan we discussed and let me know if I can assist you in the future.   Screening recommendations/referrals: Colonoscopy: 09/25/14 due 09/25/24 Mammogram: 02/2021 due 7/23 per pt Bone Density: 01/19/21 due 01/20/23 Recommended yearly ophthalmology/optometry visit for glaucoma screening and checkup Recommended yearly dental visit for hygiene and checkup  Vaccinations: Influenza vaccine: up to date Pneumococcal vaccine: up to date Tdap vaccine: up to date Shingles vaccine: up to date per pt   Covid-19:completed  Advanced directives: Yes, will bring copy to next visit  Conditions/risks identified: see problem list  Next appointment: Follow up in one year for your annual wellness visit    Preventive Care 44 Years and Older, Female Preventive care refers to lifestyle choices and visits with your health care provider that can promote health and wellness. What does preventive care include? A yearly physical exam. This is also called an annual well check. Dental exams once or twice a year. Routine eye exams. Ask your health care provider how often you should have your eyes checked. Personal lifestyle choices, including: Daily care of your teeth and gums. Regular physical activity. Eating a healthy diet. Avoiding tobacco and drug use. Limiting alcohol use. Practicing safe sex. Taking low-dose aspirin every day. Taking vitamin and mineral supplements as recommended by your health care provider. What happens during an annual well check? The services and screenings done by your health care provider during your annual well check will depend on your age, overall health, lifestyle risk factors, and family history of disease. Counseling  Your health care provider may ask you questions about your: Alcohol use. Tobacco use. Drug  use. Emotional well-being. Home and relationship well-being. Sexual activity. Eating habits. History of falls. Memory and ability to understand (cognition). Work and work Statistician. Reproductive health. Screening  You may have the following tests or measurements: Height, weight, and BMI. Blood pressure. Lipid and cholesterol levels. These may be checked every 5 years, or more frequently if you are over 83 years old. Skin check. Lung cancer screening. You may have this screening every year starting at age 58 if you have a 30-pack-year history of smoking and currently smoke or have quit within the past 15 years. Fecal occult blood test (FOBT) of the stool. You may have this test every year starting at age 63. Flexible sigmoidoscopy or colonoscopy. You may have a sigmoidoscopy every 5 years or a colonoscopy every 10 years starting at age 23. Hepatitis C blood test. Hepatitis B blood test. Sexually transmitted disease (STD) testing. Diabetes screening. This is done by checking your blood sugar (glucose) after you have not eaten for a while (fasting). You may have this done every 1-3 years. Bone density scan. This is done to screen for osteoporosis. You may have this done starting at age 6. Mammogram. This may be done every 1-2 years. Talk to your health care provider about how often you should have regular mammograms. Talk with your health care provider about your test results, treatment options, and if necessary, the need for more tests. Vaccines  Your health care provider may recommend certain vaccines, such as: Influenza vaccine. This is recommended every year. Tetanus, diphtheria, and acellular pertussis (Tdap, Td) vaccine. You may need a Td booster every 10 years. Zoster vaccine. You may need this after age 88. Pneumococcal 13-valent conjugate (PCV13) vaccine. One dose is recommended  after age 43. Pneumococcal polysaccharide (PPSV23) vaccine. One dose is recommended after age  66. Talk to your health care provider about which screenings and vaccines you need and how often you need them. This information is not intended to replace advice given to you by your health care provider. Make sure you discuss any questions you have with your health care provider. Document Released: 09/03/2015 Document Revised: 04/26/2016 Document Reviewed: 06/08/2015 Elsevier Interactive Patient Education  2017 [place] Prevention in the Home Falls can cause injuries. They can happen to people of all ages. There are many things you can do to make your home safe and to help prevent falls. What can I do on the outside of my home? Regularly fix the edges of walkways and driveways and fix any cracks. Remove anything that might make you trip as you walk through a door, such as a raised step or threshold. Trim any bushes or trees on the path to your home. Use bright outdoor lighting. Clear any walking paths of anything that might make someone trip, such as rocks or tools. Regularly check to see if handrails are loose or broken. Make sure that both sides of any steps have handrails. Any raised decks and porches should have guardrails on the edges. Have any leaves, snow, or ice cleared regularly. Use sand or salt on walking paths during winter. Clean up any spills in your garage right away. This includes oil or grease spills. What can I do in the bathroom? Use night lights. Install grab bars by the toilet and in the tub and shower. Do not use towel bars as grab bars. Use non-skid mats or decals in the tub or shower. If you need to sit down in the shower, use a plastic, non-slip stool. Keep the floor dry. Clean up any water that spills on the floor as soon as it happens. Remove soap buildup in the tub or shower regularly. Attach bath mats securely with double-sided non-slip rug tape. Do not have throw rugs and other things on the floor that can make you trip. What can I do in the  bedroom? Use night lights. Make sure that you have a light by your bed that is easy to reach. Do not use any sheets or blankets that are too big for your bed. They should not hang down onto the floor. Have a firm chair that has side arms. You can use this for support while you get dressed. Do not have throw rugs and other things on the floor that can make you trip. What can I do in the kitchen? Clean up any spills right away. Avoid walking on wet floors. Keep items that you use a lot in easy-to-reach places. If you need to reach something above you, use a strong step stool that has a grab bar. Keep electrical cords out of the way. Do not use floor polish or wax that makes floors slippery. If you must use wax, use non-skid floor wax. Do not have throw rugs and other things on the floor that can make you trip. What can I do with my stairs? Do not leave any items on the stairs. Make sure that there are handrails on both sides of the stairs and use them. Fix handrails that are broken or loose. Make sure that handrails are as long as the stairways. Check any carpeting to make sure that it is firmly attached to the stairs. Fix any carpet that is loose or worn. Avoid having throw  rugs at the top or bottom of the stairs. If you do have throw rugs, attach them to the floor with carpet tape. Make sure that you have a light switch at the top of the stairs and the bottom of the stairs. If you do not have them, ask someone to add them for you. What else can I do to help prevent falls? Wear shoes that: Do not have high heels. Have rubber bottoms. Are comfortable and fit you well. Are closed at the toe. Do not wear sandals. If you use a stepladder: Make sure that it is fully opened. Do not climb a closed stepladder. Make sure that both sides of the stepladder are locked into place. Ask someone to hold it for you, if possible. Clearly mark and make sure that you can see: Any grab bars or  handrails. First and last steps. Where the edge of each step is. Use tools that help you move around (mobility aids) if they are needed. These include: Canes. Walkers. Scooters. Crutches. Turn on the lights when you go into a dark area. Replace any light bulbs as soon as they burn out. Set up your furniture so you have a clear path. Avoid moving your furniture around. If any of your floors are uneven, fix them. If there are any pets around you, be aware of where they are. Review your medicines with your doctor. Some medicines can make you feel dizzy. This can increase your chance of falling. Ask your doctor what other things that you can do to help prevent falls. This information is not intended to replace advice given to you by your health care provider. Make sure you discuss any questions you have with your health care provider. Document Released: 06/03/2009 Document Revised: 01/13/2016 Document Reviewed: 09/11/2014 Elsevier Interactive Patient Education  2017 [employer].

## 2021-11-01 DIAGNOSIS — M79671 Pain in right foot: Secondary | ICD-10-CM | POA: Diagnosis not present

## 2021-11-09 DIAGNOSIS — S92321D Displaced fracture of second metatarsal bone, right foot, subsequent encounter for fracture with routine healing: Secondary | ICD-10-CM | POA: Diagnosis not present

## 2021-11-15 DIAGNOSIS — L820 Inflamed seborrheic keratosis: Secondary | ICD-10-CM | POA: Diagnosis not present

## 2021-11-21 ENCOUNTER — Other Ambulatory Visit: Payer: Self-pay | Admitting: Family Medicine

## 2021-11-21 DIAGNOSIS — I890 Lymphedema, not elsewhere classified: Secondary | ICD-10-CM

## 2021-11-25 ENCOUNTER — Emergency Department (INDEPENDENT_AMBULATORY_CARE_PROVIDER_SITE_OTHER)
Admission: RE | Admit: 2021-11-25 | Discharge: 2021-11-25 | Disposition: A | Discharge status: Home / Self Care | Payer: Medicare Other | Source: Ambulatory Visit

## 2021-11-25 VITALS — BP 133/82 | HR 73 | Temp 98.6°F | Resp 17 | Ht 67.0 in | Wt 285.9 lb

## 2021-11-25 DIAGNOSIS — N3000 Acute cystitis without hematuria: Secondary | ICD-10-CM | POA: Diagnosis not present

## 2021-11-25 LAB — POCT URINALYSIS DIP (MANUAL ENTRY)
Bilirubin, UA: NEGATIVE
Blood, UA: NEGATIVE
Glucose, UA: NEGATIVE mg/dL
Ketones, POC UA: NEGATIVE mg/dL
Nitrite, UA: POSITIVE — AB
Protein Ur, POC: NEGATIVE mg/dL
Spec Grav, UA: <=1.005 — AB (ref 1.010–1.025)
Urobilinogen, UA: 0.2 E.U./dL
pH, UA: 5.5 (ref 5.0–8.0)

## 2021-11-25 MED ORDER — CEPHALEXIN 500 MG PO CAPS: 500.0000 mg | ORAL_CAPSULE | Freq: Three times a day (TID) | ORAL | 0 refills | Status: AC

## 2021-11-25 NOTE — ED Triage Notes (Signed)
Frequency & bladder pressure -started at 0100 today  ?Took AZO at 0730 ? ?

## 2021-11-25 NOTE — Discharge Instructions (Addendum)
Advised patient to take medication as directed with food to completion.  Encouraged patient to increase daily water intake while taking this medication.  Advised patient we will follow-up with urine culture results once received. °
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
## 2021-11-27 LAB — URINE CULTURE
MICRO NUMBER:: 13236592
SPECIMEN QUALITY:: ADEQUATE

## 2021-11-27 NOTE — Progress Notes (Signed)
Culture results reviewed by provider here today - no changes needed in your antibiotic at this time. Hope you are feeling better

## 2021-11-28 ENCOUNTER — Other Ambulatory Visit: Payer: Self-pay

## 2021-11-28 MED ORDER — OZEMPIC (0.25 OR 0.5 MG/DOSE) 2 MG/3ML ~~LOC~~ SOPN: 0.5000 mL | PEN_INJECTOR | SUBCUTANEOUS | Status: DC

## 2021-12-07 DIAGNOSIS — S92321D Displaced fracture of second metatarsal bone, right foot, subsequent encounter for fracture with routine healing: Secondary | ICD-10-CM | POA: Diagnosis not present

## 2021-12-13 NOTE — Patient Instructions (Addendum)
Good to see you again today ? ?You can get a second dose of the newer/bivalent COVID-vaccine 4 months after your first dose ? ?I will be in touch with your labs ?Once your BMI is down to about 40 you can probably get that knee done!  ?
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
## 2021-12-14 ENCOUNTER — Ambulatory Visit
Admission: RE | Admit: 2021-12-14 | Discharge: 2021-12-14 | Disposition: A | Discharge status: Home / Self Care | Payer: Medicare Other | Source: Ambulatory Visit | Attending: Oncology | Admitting: Oncology

## 2021-12-14 ENCOUNTER — Other Ambulatory Visit: Payer: Self-pay | Admitting: Family Medicine

## 2021-12-14 DIAGNOSIS — C541 Malignant neoplasm of endometrium: Secondary | ICD-10-CM

## 2021-12-14 DIAGNOSIS — N6489 Other specified disorders of breast: Secondary | ICD-10-CM | POA: Diagnosis not present

## 2021-12-14 DIAGNOSIS — G6289 Other specified polyneuropathies: Secondary | ICD-10-CM

## 2021-12-14 DIAGNOSIS — D0590 Unspecified type of carcinoma in situ of unspecified breast: Secondary | ICD-10-CM

## 2021-12-14 DIAGNOSIS — Z803 Family history of malignant neoplasm of breast: Secondary | ICD-10-CM | POA: Diagnosis not present

## 2021-12-14 MED ORDER — GADOBUTROL 1 MMOL/ML IV SOLN
10.0000 mL | Freq: Once | INTRAVENOUS | Status: AC | PRN
  Administered 2021-12-14: 10 mL via INTRAVENOUS

## 2021-12-19 ENCOUNTER — Ambulatory Visit (INDEPENDENT_AMBULATORY_CARE_PROVIDER_SITE_OTHER): Payer: Medicare Other | Admitting: Family Medicine

## 2021-12-19 DIAGNOSIS — E782 Mixed hyperlipidemia: Secondary | ICD-10-CM | POA: Diagnosis not present

## 2021-12-19 DIAGNOSIS — I10 Essential (primary) hypertension: Secondary | ICD-10-CM

## 2021-12-19 DIAGNOSIS — R7309 Other abnormal glucose: Secondary | ICD-10-CM

## 2021-12-19 DIAGNOSIS — R7989 Other specified abnormal findings of blood chemistry: Secondary | ICD-10-CM | POA: Diagnosis not present

## 2021-12-19 DIAGNOSIS — E039 Hypothyroidism, unspecified: Secondary | ICD-10-CM | POA: Diagnosis not present

## 2021-12-19 LAB — TSH: TSH: 4.43 u[IU]/mL (ref 0.35–5.50)

## 2021-12-19 LAB — COMPREHENSIVE METABOLIC PANEL
ALT: 45 U/L — ABNORMAL HIGH (ref 0–35)
AST: 35 U/L (ref 0–37)
Albumin: 4.3 g/dL (ref 3.5–5.2)
Alkaline Phosphatase: 152 U/L — ABNORMAL HIGH (ref 39–117)
BUN: 20 mg/dL (ref 6–23)
CO2: 32 mEq/L (ref 19–32)
Calcium: 9.6 mg/dL (ref 8.4–10.5)
Chloride: 99 mEq/L (ref 96–112)
Creatinine, Ser: 0.79 mg/dL (ref 0.40–1.20)
GFR: 72.97 mL/min (ref >60.00)
Glucose, Bld: 81 mg/dL (ref 70–99)
Potassium: 4 mEq/L (ref 3.5–5.1)
Sodium: 140 mEq/L (ref 135–145)
Total Bilirubin: 0.4 mg/dL (ref 0.2–1.2)
Total Protein: 7.1 g/dL (ref 6.0–8.3)

## 2021-12-19 LAB — LIPID PANEL
Cholesterol: 132 mg/dL (ref 0–200)
HDL: 49.1 mg/dL (ref >39.00)
LDL Cholesterol: 61 mg/dL (ref 0–99)
NonHDL: 83.2
Total CHOL/HDL Ratio: 3
Triglycerides: 110 mg/dL (ref 0.0–149.0)
VLDL: 22 mg/dL (ref 0.0–40.0)

## 2021-12-19 LAB — CBC
HCT: 38.7 % (ref 36.0–46.0)
Hemoglobin: 12.8 g/dL (ref 12.0–15.0)
MCHC: 33.2 g/dL (ref 30.0–36.0)
MCV: 95.8 fl (ref 78.0–100.0)
Platelets: 228 10*3/uL (ref 150.0–400.0)
RBC: 4.04 Mil/uL (ref 3.87–5.11)
RDW: 13.2 % (ref 11.5–15.5)
WBC: 6.8 10*3/uL (ref 4.0–10.5)

## 2021-12-19 LAB — HEMOGLOBIN A1C: Hgb A1c MFr Bld: 5.6 % (ref 4.6–6.5)

## 2021-12-30 DIAGNOSIS — M170 Bilateral primary osteoarthritis of knee: Secondary | ICD-10-CM | POA: Diagnosis not present

## 2022-01-04 DIAGNOSIS — S92321D Displaced fracture of second metatarsal bone, right foot, subsequent encounter for fracture with routine healing: Secondary | ICD-10-CM | POA: Diagnosis not present

## 2022-01-06 ENCOUNTER — Telehealth: Payer: Self-pay | Admitting: Family Medicine

## 2022-01-06 MED ORDER — SEMAGLUTIDE (1 MG/DOSE) 4 MG/3ML ~~LOC~~ SOPN: 1.0000 mg | PEN_INJECTOR | SUBCUTANEOUS | 3 refills | Status: DC

## 2022-01-06 NOTE — Telephone Encounter (Signed)
Pt called wondering if she could be increased to the next dosage for her Ozempic. She is currently at 0.'5MG'$ .

## 2022-01-06 NOTE — Telephone Encounter (Signed)
Okay for increase in dose?

## 2022-01-25 ENCOUNTER — Other Ambulatory Visit: Payer: Self-pay | Admitting: Family Medicine

## 2022-01-25 DIAGNOSIS — G6289 Other specified polyneuropathies: Secondary | ICD-10-CM

## 2022-01-30 ENCOUNTER — Other Ambulatory Visit: Payer: Self-pay | Admitting: Family Medicine

## 2022-01-30 DIAGNOSIS — I10 Essential (primary) hypertension: Secondary | ICD-10-CM

## 2022-02-14 DIAGNOSIS — H26491 Other secondary cataract, right eye: Secondary | ICD-10-CM | POA: Diagnosis not present

## 2022-02-14 DIAGNOSIS — H04123 Dry eye syndrome of bilateral lacrimal glands: Secondary | ICD-10-CM | POA: Diagnosis not present
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
## 2022-02-22 ENCOUNTER — Ambulatory Visit: Payer: Medicare Other | Admitting: Obstetrics and Gynecology
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
## 2022-03-06 ENCOUNTER — Ambulatory Visit (INDEPENDENT_AMBULATORY_CARE_PROVIDER_SITE_OTHER): Payer: Medicare Other | Admitting: Family Medicine

## 2022-03-06 VITALS — BP 120/80 | HR 72 | Temp 98.2°F | Resp 18 | Ht 66.0 in | Wt 274.0 lb

## 2022-03-06 DIAGNOSIS — G6289 Other specified polyneuropathies: Secondary | ICD-10-CM | POA: Diagnosis not present

## 2022-03-06 DIAGNOSIS — E782 Mixed hyperlipidemia: Secondary | ICD-10-CM | POA: Diagnosis not present

## 2022-03-06 DIAGNOSIS — E039 Hypothyroidism, unspecified: Secondary | ICD-10-CM

## 2022-03-06 DIAGNOSIS — I10 Essential (primary) hypertension: Secondary | ICD-10-CM | POA: Diagnosis not present

## 2022-03-06 MED ORDER — SEMAGLUTIDE (1 MG/DOSE) 4 MG/3ML ~~LOC~~ SOPN: 1.0000 mg | PEN_INJECTOR | SUBCUTANEOUS | 2 refills | Status: DC

## 2022-03-06 MED ORDER — TRAMADOL HCL 50 MG PO TABS: ORAL_TABLET | ORAL | 2 refills | Status: DC

## 2022-03-06 MED ORDER — SEMAGLUTIDE (2 MG/DOSE) 8 MG/3ML ~~LOC~~ SOPN: 2.0000 mg | PEN_INJECTOR | SUBCUTANEOUS | 1 refills | Status: DC

## 2022-03-06 NOTE — Patient Instructions (Addendum)
Continue ozempic 1 mg- let me know if weight does not start to move down  I will convert your armour to levothyroxine once your TSH comes in  You might try a dose of miralax every day or so for constipation

## 2022-03-07 LAB — TSH: TSH: 1.55 u[IU]/mL (ref 0.35–5.50)

## 2022-03-13 ENCOUNTER — Telehealth: Payer: Self-pay | Admitting: Family Medicine

## 2022-03-13 MED ORDER — LEVOTHYROXINE SODIUM 125 MCG PO TABS: 250.0000 ug | ORAL_TABLET | Freq: Every day | ORAL | 5 refills | Status: DC

## 2022-03-13 MED ORDER — LEVOTHYROXINE SODIUM 125 MCG PO TABS: 125.0000 ug | ORAL_TABLET | Freq: Every day | ORAL | 5 refills | Status: DC

## 2022-03-13 NOTE — Addendum Note (Signed)
Addended by: Lamar Blinks C on: 03/13/2022 01:04 PM   Modules accepted: Orders

## 2022-03-13 NOTE — Telephone Encounter (Signed)
Labs states " I will convert your armour to levothyroxine once your TSH comes in."  But not sure what was to come after that. Please advise.

## 2022-03-13 NOTE — Telephone Encounter (Signed)
Patient states that per her last lab results, Dr. Lorelei Pont was going to send her Levothyroxide or Synthroid to her pharmacy. I was unable to find anything on her chart regarding those medications. Please advise.

## 2022-03-19 ENCOUNTER — Emergency Department
Admission: RE | Admit: 2022-03-19 | Discharge: 2022-03-19 | Disposition: A | Discharge status: Home / Self Care | Payer: Medicare Other | Source: Ambulatory Visit | Attending: Family Medicine | Admitting: Family Medicine

## 2022-03-19 ENCOUNTER — Other Ambulatory Visit: Payer: Self-pay

## 2022-03-19 VITALS — BP 118/80 | HR 103 | Temp 99.0°F | Resp 20 | Ht 67.0 in | Wt 268.0 lb

## 2022-03-19 DIAGNOSIS — N3001 Acute cystitis with hematuria: Secondary | ICD-10-CM

## 2022-03-19 LAB — POCT URINALYSIS DIP (MANUAL ENTRY)
Bilirubin, UA: NEGATIVE
Blood, UA: NEGATIVE
Glucose, UA: NEGATIVE mg/dL
Ketones, POC UA: NEGATIVE mg/dL
Nitrite, UA: POSITIVE — AB
Protein Ur, POC: NEGATIVE mg/dL
Spec Grav, UA: 1.015 (ref 1.010–1.025)
Urobilinogen, UA: 1 E.U./dL
pH, UA: 5 (ref 5.0–8.0)

## 2022-03-19 MED ORDER — CEPHALEXIN 500 MG PO CAPS: 1000.0000 mg | ORAL_CAPSULE | Freq: Two times a day (BID) | ORAL | 0 refills | Status: AC

## 2022-03-19 NOTE — ED Triage Notes (Signed)
Pt presents to Urgent Care with c/o urinary frequency and suprapubic cramps since yesterday. States she has been taking pyridium.
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
## 2022-03-19 NOTE — Discharge Instructions (Addendum)
Advised patient to take medication as directed with food to completion.  Encouraged patient to increase daily water intake while taking this medication.  Advised patient we will follow-up with urine culture results once received.  Advised patient if symptoms worsen and/or unresolved please follow-up with PCP or here for further evaluation.

## 2022-03-20 ENCOUNTER — Telehealth: Payer: Self-pay | Admitting: Emergency Medicine

## 2022-03-20 NOTE — Telephone Encounter (Signed)
[place].  Advised that if she is doing well, she can disregard the call.  Any questions or concerns, feel free to contact the office.

## 2022-03-21 LAB — URINE CULTURE: Culture: >=100000 — AB

## 2022-03-22 DIAGNOSIS — L281 Prurigo nodularis: Secondary | ICD-10-CM | POA: Diagnosis not present

## 2022-03-22 DIAGNOSIS — L700 Acne vulgaris: Secondary | ICD-10-CM | POA: Diagnosis not present

## 2022-03-22 DIAGNOSIS — L820 Inflamed seborrheic keratosis: Secondary | ICD-10-CM | POA: Diagnosis not present

## 2022-03-22 DIAGNOSIS — D485 Neoplasm of uncertain behavior of skin: Secondary | ICD-10-CM | POA: Diagnosis not present

## 2022-03-22 DIAGNOSIS — L821 Other seborrheic keratosis: Secondary | ICD-10-CM | POA: Diagnosis not present

## 2022-04-10 ENCOUNTER — Ambulatory Visit (INDEPENDENT_AMBULATORY_CARE_PROVIDER_SITE_OTHER): Payer: Medicare Other | Admitting: Pulmonary Disease

## 2022-04-10 ENCOUNTER — Encounter: Payer: Self-pay | Admitting: Pulmonary Disease

## 2022-04-10 VITALS — BP 90/70 | HR 83 | Ht 67.0 in | Wt 276.2 lb

## 2022-04-10 DIAGNOSIS — J849 Interstitial pulmonary disease, unspecified: Secondary | ICD-10-CM

## 2022-04-10 DIAGNOSIS — Z6841 Body Mass Index (BMI) 40.0 and over, adult: Secondary | ICD-10-CM | POA: Diagnosis not present

## 2022-04-10 DIAGNOSIS — G4733 Obstructive sleep apnea (adult) (pediatric): Secondary | ICD-10-CM | POA: Diagnosis not present

## 2022-04-10 DIAGNOSIS — J432 Centrilobular emphysema: Secondary | ICD-10-CM

## 2022-04-10 NOTE — Progress Notes (Signed)
[facility]  Chief Complaint  Patient presents with   Follow-up    Follow-up: Early morning cough, some SOB    Past Surgical History:  She  has a past surgical history that includes Dilation and curettage of uterus; Back surgery; Tonsillectomy; Wisdom tooth extraction; Right colectomy (2008); colonscopy (12/09/11); Hysteroscopy with D & C (05/29/2011); Urethrotomy (1984); Hysteroscopy; Polypectomy; Excisional hemorrhoidectomy; Cholecystectomy; Laparoscopic cholecystectomy; Dilatation & currettage/hysteroscopy with resectoscope (N/A, 03/03/2013); Robotic assisted total hysterectomy with bilateral salpingo oophorectomy (Bilateral, 04/01/2013); Colectomy; and Breast lumpectomy with radioactive seed localization (Left, 04/27/2015).  Past Medical History:  Anxiety, Depression, OA, Back pain, Endometrial cancer, Fibromyalgia, GERD, HLD, HTN, Graves disease, Hypothyroidism, IBS, Neuropathy, Fatty liver  Constitutional:  BP 90/70 (BP Location: Left Arm)   Pulse 83   Ht '5\' 7"'$  (1.702 m)   Wt 276 lb 3.2 oz (125.3 kg)   LMP 11/20/2011 (Approximate)   SpO2 96%   BMI 43.26 kg/m   Brief Summary:  Shelly Mosley is a 76 y.o. female former smoker with emphysema, ILD, chronic bronchitis, OSA.      Subjective:   Breathing has been stable.  Gets occasional cough when weather is warmer.  She uses recumbent elliptical trainer for 20 minutes a day without difficulty.  She has about 10 lbs since her last visit in January.  She will follow up with orthopedics to determine whether she can have knee surgery. Sleeping okay.    Physical Exam:   Appearance - well kempt   ENMT - no sinus tenderness, no oral exudate, no LAN, Mallampati 3 airway, no stridor  Respiratory - equal breath sounds bilaterally, no wheezing or rales  CV - s1s2 regular rate and rhythm, no murmurs  Ext - no clubbing, no edema  Skin - no rashes  Psych - normal mood and affect   Pulmonary  testing:  PFT 09/19/19 >> FEV1 1.82 (75%), FEV1% 76, TLC 5.02 (92%), DLCO 98%, No BD Serology 01/26/20 >> ANA positive 1:640 homogenous pattern A1AT 01/26/20 >> 192, MM PFT 10/11/20 >> FEV1 1.68 (70%), FEV1% 76, TLC 4.83 (88%), DLCO 102%  Chest Imaging:  HRCT chest 07/14/19 >> atherosclerosis, mild centrilobular and paraseptal emphysema, minimal peripheral and subpleural GGO, scarring RLL, scattered peripheral nodules up to 4 mm (benign) HRCT chest 08/31/21 >> mild emphysema, 3 mm LUL nodule, 4 mm RML nodule, mild patchy subpleural reticulation and GGO (nonprogessive NSIP or atypical UIP), cirrhosis  Sleep Tests:  HST 09/10/19 >> AHI 11.8, SpO2 low 67%  Cardiac Tests:  Echo 12/29/20 >> EF 50 to 55%, grade 1 DD  Social History:  She  reports that she quit smoking about 9 years ago. Her smoking use included cigarettes. She has a 42.00 pack-year smoking history. She has never used smokeless tobacco. She reports current alcohol use. She reports that she does not use drugs.  Family History:  Her family history includes Breast cancer in her maternal grandmother; Breast cancer (age of onset: 11) in her maternal aunt and sister; Breast cancer (age of onset: 92) in her mother; Colon cancer in her maternal aunt; Diabetes in her brother and mother; Heart failure in her father; Hypertension in her brother and sister; Thyroid disease in her mother.     Assessment/Plan:   ILD with NSIP pattern with positive ANA. - clinically stable - repeat testing if she develops progressive symptoms   Chronic bronchitis with mild emphysema. - symbicort 80 two puffs qhs - prn albuterol - LAMA's caused ophthalmologic  side effects, singulair caused GI side effects   Allergic rhinitis. - prn zyrtec, nasal irrigation   Obstructive sleep apnea. - never tried on CPAP - improving with weight loss   Obesity. - she will d/w her PCP about whether she can have dose of ozepmic increased  Right knee pain. - she is  followed by Dr. Hector Shade with orthopedics    Time Spent Involved in Patient Care on Day of Examination:  35 minutes  Follow up:   Patient Instructions  Follow up in 6 months  Medication List:   Allergies as of 04/10/2022       Reactions   Chloroxylenol (antiseptic) Rash   Ciprofloxacin Hcl Hives   Codeine Swelling   Swollen lips.  Pt has taken vicoden w/o problems   Advil [ibuprofen] Rash   Amoxicillin-pot Clavulanate Diarrhea   Pt had bad diarrhea. Note: Can take cephalexin without any problems.   Nsaids Swelling, Rash   Rash and itching.   Statins Other (See Comments)   Increased LFTs- pt currently tolerating low dose Lavalo   Sulfa Antibiotics Rash   Tolmetin Rash, Swelling   Rash and itching.        Medication List        Accurate as of April 10, 2022 10:23 AM. If you have any questions, ask your nurse or doctor.          albuterol 108 (90 Base) MCG/ACT inhaler Commonly known as: ProAir HFA Inhale 1 puff into the lungs every 6 (six) hours as needed for wheezing or shortness of breath.   atorvastatin 20 MG tablet Commonly known as: LIPITOR TAKE 1 TABLET(20 MG) BY MOUTH DAILY   budesonide-formoterol 80-4.5 MCG/ACT inhaler Commonly known as: Symbicort Inhale 2 puffs into the lungs 2 (two) times daily.   cetirizine 10 MG tablet Commonly known as: ZYRTEC Take 10 mg by mouth daily.   CRANBERRY FRUIT PO Take by mouth.   diclofenac Sodium 1 % Gel Commonly known as: VOLTAREN Apply topically 4 (four) times daily.   furosemide 80 MG tablet Commonly known as: LASIX TAKE 1 TABLET(80 MG) BY MOUTH DAILY   Hypromellose 0.3 % Soln Place 1 drop into both eyes at bedtime.   levothyroxine 125 MCG tablet Commonly known as: SYNTHROID Take 2 tablets (250 mcg total) by mouth daily.   losartan 50 MG tablet Commonly known as: COZAAR TAKE 1 TABLET(50 MG) BY MOUTH TWICE DAILY   Medical Compression Stockings Misc 1 application by Does not apply route  daily.   multivitamin tablet Take 1 tablet by mouth daily.   Omega 3 1200 MG Caps Take 1 capsule by mouth 2 (two) times daily.   phenazopyridine 95 MG tablet Commonly known as: PYRIDIUM Take 95 mg by mouth 3 (three) times daily as needed for pain.   pregabalin 100 MG capsule Commonly known as: LYRICA TAKE 1 CAPSULE(100 MG) BY MOUTH THREE TIMES DAILY   Semaglutide (1 MG/DOSE) 4 MG/3ML Sopn Inject 1 mg into the skin once a week.   traMADol 50 MG tablet Commonly known as: ULTRAM TAKE 1 TABLET BY MOUTH THREE TIMES DAILY AS NEEDED FOR KNEE PAIN   Ultra Fresh PM Oint Apply to eye.   venlafaxine 50 MG tablet Commonly known as: EFFEXOR TAKE 1 TABLET(50 MG) BY MOUTH TWICE DAILY WITH A MEAL   Vitamin D-3 125 MCG (5000 UT) Tabs Take 1 tablet by mouth daily.        Signature:  Chesley Mires, MD [place] Pager - [phone] 04/10/2022, 10:23 AM

## 2022-04-10 NOTE — Patient Instructions (Signed)
Follow up in 6 months 

## 2022-04-21 DIAGNOSIS — M170 Bilateral primary osteoarthritis of knee: Secondary | ICD-10-CM | POA: Diagnosis not present

## 2022-04-26 ENCOUNTER — Other Ambulatory Visit (INDEPENDENT_AMBULATORY_CARE_PROVIDER_SITE_OTHER): Payer: Medicare Other

## 2022-04-26 DIAGNOSIS — E039 Hypothyroidism, unspecified: Secondary | ICD-10-CM

## 2022-04-26 LAB — TSH: TSH: 0.07 u[IU]/mL — ABNORMAL LOW (ref 0.35–5.50)

## 2022-04-27 ENCOUNTER — Telehealth: Payer: Self-pay | Admitting: Family Medicine

## 2022-04-27 DIAGNOSIS — E039 Hypothyroidism, unspecified: Secondary | ICD-10-CM

## 2022-04-27 MED ORDER — LEVOTHYROXINE SODIUM 200 MCG PO TABS: 200.0000 ug | ORAL_TABLET | Freq: Every day | ORAL | 3 refills | Status: DC

## 2022-04-27 NOTE — Telephone Encounter (Signed)
Received her TSH, need to adjust her levothyroxine We recently changed her thyroid replacement from [facility] Thyroid to an equivalent dose of levothyroxine for cost savings.  However, her TSH is low on current dose of levothyroxine 250. Will decrease to 200 and recheck in 4 to 6 weeks Called but did not reach patient,  will try her again alter  Results for orders placed or performed in visit on 04/26/22  TSH  Result Value Ref Range   TSH 0.07 (L) 0.35 - 5.50 uIU/mL

## 2022-04-28 NOTE — Telephone Encounter (Signed)
Called and reached pt- went over these recs, she states understanding

## 2022-05-03 ENCOUNTER — Other Ambulatory Visit: Payer: Self-pay | Admitting: Family Medicine

## 2022-05-03 DIAGNOSIS — F341 Dysthymic disorder: Secondary | ICD-10-CM

## 2022-05-19 ENCOUNTER — Other Ambulatory Visit (HOSPITAL_BASED_OUTPATIENT_CLINIC_OR_DEPARTMENT_OTHER): Payer: Self-pay

## 2022-05-19 DIAGNOSIS — Z23 Encounter for immunization: Secondary | ICD-10-CM | POA: Diagnosis not present

## 2022-05-19 MED ORDER — FLUAD QUADRIVALENT 0.5 ML IM PRSY
PREFILLED_SYRINGE | INTRAMUSCULAR | 0 refills | Status: DC
  Filled 2022-05-19: qty 0.5, 1d supply, fill #0

## 2022-05-22 ENCOUNTER — Other Ambulatory Visit (HOSPITAL_BASED_OUTPATIENT_CLINIC_OR_DEPARTMENT_OTHER): Payer: Self-pay

## 2022-05-31 ENCOUNTER — Encounter: Payer: Self-pay | Admitting: Obstetrics and Gynecology

## 2022-05-31 ENCOUNTER — Ambulatory Visit (INDEPENDENT_AMBULATORY_CARE_PROVIDER_SITE_OTHER): Payer: Medicare Other | Admitting: Obstetrics and Gynecology

## 2022-05-31 VITALS — BP 128/82 | HR 88 | Ht 66.0 in | Wt 264.0 lb

## 2022-05-31 DIAGNOSIS — N644 Mastodynia: Secondary | ICD-10-CM | POA: Diagnosis not present

## 2022-05-31 DIAGNOSIS — R351 Nocturia: Secondary | ICD-10-CM

## 2022-05-31 DIAGNOSIS — Z8542 Personal history of malignant neoplasm of other parts of uterus: Secondary | ICD-10-CM | POA: Diagnosis not present

## 2022-05-31 DIAGNOSIS — N949 Unspecified condition associated with female genital organs and menstrual cycle: Secondary | ICD-10-CM

## 2022-05-31 DIAGNOSIS — B372 Candidiasis of skin and nail: Secondary | ICD-10-CM

## 2022-05-31 DIAGNOSIS — Z5181 Encounter for therapeutic drug level monitoring: Secondary | ICD-10-CM

## 2022-05-31 DIAGNOSIS — R350 Frequency of micturition: Secondary | ICD-10-CM | POA: Diagnosis not present

## 2022-05-31 MED ORDER — NYSTATIN 100000 UNIT/GM EX POWD: 1.0000 | Freq: Three times a day (TID) | CUTANEOUS | 2 refills | Status: DC
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
## 2022-05-31 NOTE — Patient Instructions (Signed)
Vibegron Tablets What is this medication? VIBEGRON (vye BEG ron) treats symptoms of an overactive bladder, such as loss of bladder control or frequent need to urinate. It works by relaxing muscles in the bladder. It belongs to a group of medications called antispasmodics. This medicine may be used for other purposes; ask your health care provider or pharmacist if you have questions. COMMON BRAND NAME(S): GEMTESA What should I tell my care team before I take this medication? They need to know if you have any of these conditions: Kidney disease Liver disease Prostate disease Trouble passing urine An unusual or allergic reaction to vibegron, other medications, foods, dyes, or preservatives Pregnant or trying to get pregnant Breast-feeding How should I use this medication? Take this medication by mouth with water. Take it as directed on the prescription label at the same time every day. Swallow the tablets whole. You may crush the tablet and mix with 1 tablespoon (15 mL) of applesauce. Swallow the medication and applesauce right away. Keep taking it unless your care team tells you to stop. You can take it with or without food. If it upsets your stomach, take it with food. Talk to your care team about the use of this medication in children. Special care may be needed. Overdosage: If you think you have taken too much of this medicine contact a poison control center or emergency room at once. NOTE: This medicine is only for you. Do not share this medicine with others. What if I miss a dose? If you miss a dose, take it as soon as you can. If it is almost time for your next dose, take only that dose. Do not take double or extra doses. What may interact with this medication? This medication may interact with the following: Digoxin This list may not describe all possible interactions. Give your health care provider a list of all the medicines, herbs, non-prescription drugs, or dietary supplements you  use. Also tell them if you smoke, drink alcohol, or use illegal drugs. Some items may interact with your medicine. What should I watch for while using this medication? Visit your care team for regular checks on your progress. Tell your care team if your symptoms do not start to get better or if they get worse. You may need to limit your intake of tea, coffee, caffeinated drinks, or alcohol. These drinks may make your symptoms worse. What side effects may I notice from receiving this medication? Side effects that you should report to your care team as soon as possible: Allergic reactions--skin rash, itching, hives, swelling of the face, lips, tongue, or throat Trouble passing urine Side effects that usually do not require medical attention (report to your care team if they continue or are bothersome): Diarrhea Headache Nausea Runny or stuffy nose Sore throat This list may not describe all possible side effects. Call your doctor for medical advice about side effects. You may report side effects to FDA at 1-800-FDA-1088. Where should I keep my medication? Keep out of the reach of children and pets. Store at room temperature between 20 and 25 degrees C (68 and 77 degrees F). Get rid of any unused medication after the expiration date. To get rid of medications that are no longer needed or have expired: Take the medication to a medication take-back program. Check with your pharmacy or law enforcement to find a location. If you cannot return the medication, check the label or package insert to see if the medication should be thrown out in  the garbage or flushed down the toilet. If you are not sure, ask your care team. If it is safe to put it in the trash, empty the medication out of the container. Mix the medication with cat litter, dirt, coffee grounds, or other unwanted substance. Seal the mixture in a bag or container. Put it in the trash. NOTE: This sheet is a summary. It may not cover all possible  information. If you have questions about this medicine, talk to your doctor, pharmacist, or health care provider.  2023 Elsevier/Gold Standard (2021-08-29 00:00:00)   Mirabegron Extended-Release Tablets What is this medication? MIRABEGRON (MIR a BEG ron) treats symptoms of an overactive bladder, such as loss of bladder control or frequent need to urinate. It works by relaxing muscles in the bladder. It belongs to a group of medications called antispasmodics. This medicine may be used for other purposes; ask your health care provider or pharmacist if you have questions. COMMON BRAND NAME(S): Myrbetriq What should I tell my care team before I take this medication? They need to know if you have any of these conditions: High blood pressure Kidney disease Liver disease Prostate disease Trouble passing urine An unusual or allergic reaction to mirabegron, other medications, foods, dyes, or preservatives Pregnant or trying to get pregnant Breast-feeding How should I use this medication? Take this medication by mouth with water. Take it as directed on the prescription label at the same time every day. Do not cut, crush, or chew this medication. Swallow the tablets whole. Adults can take it with or without food. Children should take it with food. Keep taking it unless your care team tells you to stop. Talk to your care team about the use of this medication in children. While it may be prescribed for children as young as 3 years for selected conditions, precautions do apply. Overdosage: If you think you have taken too much of this medicine contact a poison control center or emergency room at once. NOTE: This medicine is only for you. Do not share this medicine with others. What if I miss a dose? If you miss a dose, take it as soon as you can unless it is more than 12 hours late. If it is more than 12 hours late, skip the missed dose. Take the next dose at the normal time. What may interact with this  medication? Codeine Desipramine Digoxin Flecainide MAOIs, such as Carbex, Eldepryl, Marplan, Nardil, and Parnate Methadone Metoprolol Pimozide Propafenone Thioridazine Warfarin This list may not describe all possible interactions. Give your health care provider a list of all the medicines, herbs, non-prescription drugs, or dietary supplements you use. Also tell them if you smoke, drink alcohol, or use illegal drugs. Some items may interact with your medicine. What should I watch for while using this medication? Visit your care team for regular checks on your progress. Tell your care team if your symptoms do not start to get better or if they get worse. Check your blood pressure as directed. Know what your blood pressure should be and when to contact your care team. What side effects may I notice from receiving this medication? Side effects that you should report to your care team as soon as possible: Allergic reactions or angioedema--skin rash, itching or hives, swelling of the face, eyes, lips, tongue, arms, or legs, trouble swallowing or breathing Increase in blood pressure Trouble passing urine Side effects that usually do not require medical attention (report to your care team if they continue or are bothersome):  Headache Runny or stuffy nose Sore throat Stomach pain This list may not describe all possible side effects. Call your doctor for medical advice about side effects. You may report side effects to FDA at 1-800-FDA-1088. Where should I keep my medication? Keep out of the reach of children and pets. Store at room temperature between 20 and 25 degrees C (68 and 77 degrees F). Get rid of any unused medication after the expiration date. To get rid of medications that are no longer needed or have expired: Take the medication to a medication take-back program. Check with your pharmacy or law enforcement to find a location. If you cannot return the medication, check the label or  package insert to see if the medication should be thrown out in the garbage or flushed down the toilet. If you are not sure, ask your care team. If it is safe to put it in the trash, empty the medication out of the container. Mix the medication with cat litter, dirt, coffee grounds, or other unwanted substance. Seal the mixture in a bag or container. Put it in the trash. NOTE: This sheet is a summary. It may not cover all possible information. If you have questions about this medicine, talk to your doctor, pharmacist, or health care provider.  2023 Elsevier/Gold Standard (2011-02-18 00:00:00)

## 2022-06-09 DIAGNOSIS — M170 Bilateral primary osteoarthritis of knee: Secondary | ICD-10-CM | POA: Diagnosis not present

## 2022-06-13 ENCOUNTER — Other Ambulatory Visit (HOSPITAL_BASED_OUTPATIENT_CLINIC_OR_DEPARTMENT_OTHER): Payer: Self-pay

## 2022-06-13 ENCOUNTER — Other Ambulatory Visit (INDEPENDENT_AMBULATORY_CARE_PROVIDER_SITE_OTHER): Payer: Medicare Other

## 2022-06-13 DIAGNOSIS — Z23 Encounter for immunization: Secondary | ICD-10-CM | POA: Diagnosis not present

## 2022-06-13 DIAGNOSIS — E039 Hypothyroidism, unspecified: Secondary | ICD-10-CM

## 2022-06-13 LAB — TSH: TSH: 0.02 u[IU]/mL — ABNORMAL LOW (ref 0.35–5.50)

## 2022-06-13 MED ORDER — COMIRNATY 30 MCG/0.3ML IM SUSY
PREFILLED_SYRINGE | INTRAMUSCULAR | 0 refills | Status: DC
Start: 2022-06-13 — End: 2022-09-23
  Filled 2022-06-13: qty 0.3, 1d supply, fill #0

## 2022-06-15 MED ORDER — LEVOTHYROXINE SODIUM 150 MCG PO TABS: 150.0000 ug | ORAL_TABLET | Freq: Every day | ORAL | 3 refills | Status: DC

## 2022-06-15 NOTE — Addendum Note (Signed)
Addended by: Lamar Blinks C on: 06/15/2022 02:36 PM   Modules accepted: Orders

## 2022-06-15 NOTE — Progress Notes (Signed)
Received her TSH, still suppressed Earlier this year we changed her from Arm[place] Thyroid to levothyroxine for cost savings.  We have had some difficulty getting her on an equivalent dose In September she was taking 250 mcg, TSH low.  I decreased to 200 mcg-still low We will cut back to 163mg and recheck 4-6 weeks  No answer, left detailed message on her answering machine.   Results for orders placed or performed in visit on 06/13/22  TSH  Result Value Ref Range   TSH 0.02 (L) 0.35 - 5.50 uIU/mL
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
## 2022-06-26 ENCOUNTER — Ambulatory Visit (INDEPENDENT_AMBULATORY_CARE_PROVIDER_SITE_OTHER): Payer: Medicare Other | Admitting: Family Medicine

## 2022-06-26 ENCOUNTER — Other Ambulatory Visit (HOSPITAL_BASED_OUTPATIENT_CLINIC_OR_DEPARTMENT_OTHER): Payer: Self-pay

## 2022-06-26 VITALS — BP 112/60 | HR 60 | Temp 98.2°F | Resp 18 | Ht 66.0 in | Wt 267.8 lb

## 2022-06-26 DIAGNOSIS — Z01810 Encounter for preprocedural cardiovascular examination: Secondary | ICD-10-CM | POA: Diagnosis not present

## 2022-06-26 DIAGNOSIS — E039 Hypothyroidism, unspecified: Secondary | ICD-10-CM

## 2022-06-26 DIAGNOSIS — R7989 Other specified abnormal findings of blood chemistry: Secondary | ICD-10-CM

## 2022-06-26 DIAGNOSIS — G6289 Other specified polyneuropathies: Secondary | ICD-10-CM | POA: Diagnosis not present

## 2022-06-26 DIAGNOSIS — F5101 Primary insomnia: Secondary | ICD-10-CM | POA: Diagnosis not present

## 2022-06-26 DIAGNOSIS — E782 Mixed hyperlipidemia: Secondary | ICD-10-CM | POA: Diagnosis not present

## 2022-06-26 DIAGNOSIS — I10 Essential (primary) hypertension: Secondary | ICD-10-CM | POA: Diagnosis not present

## 2022-06-26 MED ORDER — TRAZODONE HCL 50 MG PO TABS: 25.0000 mg | ORAL_TABLET | Freq: Every evening | ORAL | 3 refills | Status: DC | PRN

## 2022-06-26 MED ORDER — AREXVY 120 MCG/0.5ML IM SUSR
INTRAMUSCULAR | 0 refills | Status: DC
  Filled 2022-06-26: qty 1, 1d supply, fill #0

## 2022-06-26 NOTE — Patient Instructions (Signed)
It was good to see you today Recommend a dose of RSV and the latest covid booster this fall if not done already  EKG today so we are prepared for upcoming knee surgery clearance  Try the trazodone as needed for sleep- start with a 1/2 tablet and increase to 50 mg if needed. Let me know how this works for you  Plan to recheck your TSH and BMP towards the end of this month as a lab visit only

## 2022-06-29 DIAGNOSIS — H1013 Acute atopic conjunctivitis, bilateral: Secondary | ICD-10-CM | POA: Diagnosis not present

## 2022-07-17 ENCOUNTER — Encounter: Payer: Self-pay | Admitting: Hematology and Oncology

## 2022-07-17 ENCOUNTER — Other Ambulatory Visit: Payer: Self-pay | Admitting: *Deleted

## 2022-07-17 ENCOUNTER — Inpatient Hospital Stay (HOSPITAL_BASED_OUTPATIENT_CLINIC_OR_DEPARTMENT_OTHER): Payer: Medicare Other | Admitting: Hematology and Oncology

## 2022-07-17 ENCOUNTER — Other Ambulatory Visit: Payer: Self-pay

## 2022-07-17 ENCOUNTER — Inpatient Hospital Stay: Payer: Medicare Other | Attending: Hematology and Oncology

## 2022-07-17 VITALS — BP 112/72 | HR 62 | Temp 97.7°F | Resp 16 | Ht 66.0 in | Wt 273.8 lb

## 2022-07-17 DIAGNOSIS — D0590 Unspecified type of carcinoma in situ of unspecified breast: Secondary | ICD-10-CM | POA: Diagnosis not present

## 2022-07-17 DIAGNOSIS — Z803 Family history of malignant neoplasm of breast: Secondary | ICD-10-CM | POA: Diagnosis not present

## 2022-07-17 DIAGNOSIS — Z9189 Other specified personal risk factors, not elsewhere classified: Secondary | ICD-10-CM | POA: Insufficient documentation

## 2022-07-17 DIAGNOSIS — Z8542 Personal history of malignant neoplasm of other parts of uterus: Secondary | ICD-10-CM | POA: Diagnosis not present

## 2022-07-17 LAB — CBC WITH DIFFERENTIAL (CANCER CENTER ONLY)
Abs Immature Granulocytes: 0.1 10*3/uL — ABNORMAL HIGH (ref 0.00–0.07)
Basophils Absolute: 0.1 10*3/uL (ref 0.0–0.1)
Basophils Relative: 1 %
Eosinophils Absolute: 0.2 10*3/uL (ref 0.0–0.5)
Eosinophils Relative: 2 %
HCT: 34.8 % — ABNORMAL LOW (ref 36.0–46.0)
Hemoglobin: 11.6 g/dL — ABNORMAL LOW (ref 12.0–15.0)
Immature Granulocytes: 1 %
Lymphocytes Relative: 18 %
Lymphs Abs: 1.5 10*3/uL (ref 0.7–4.0)
MCH: 31.3 pg (ref 26.0–34.0)
MCHC: 33.3 g/dL (ref 30.0–36.0)
MCV: 93.8 fL (ref 80.0–100.0)
Monocytes Absolute: 0.5 10*3/uL (ref 0.1–1.0)
Monocytes Relative: 5 %
Neutro Abs: 6.2 10*3/uL (ref 1.7–7.7)
Neutrophils Relative %: 73 %
Platelet Count: 217 10*3/uL (ref 150–400)
RBC: 3.71 MIL/uL — ABNORMAL LOW (ref 3.87–5.11)
RDW: 13.2 % (ref 11.5–15.5)
WBC Count: 8.5 10*3/uL (ref 4.0–10.5)
nRBC: 0 % (ref 0.0–0.2)

## 2022-07-17 LAB — CMP (CANCER CENTER ONLY)
ALT: 30 U/L (ref 0–44)
AST: 26 U/L (ref 15–41)
Albumin: 4 g/dL (ref 3.5–5.0)
Alkaline Phosphatase: 121 U/L (ref 38–126)
Anion gap: 6 (ref 5–15)
BUN: 20 mg/dL (ref 8–23)
CO2: 30 mmol/L (ref 22–32)
Calcium: 9.4 mg/dL (ref 8.9–10.3)
Chloride: 100 mmol/L (ref 98–111)
Creatinine: 0.82 mg/dL (ref 0.44–1.00)
GFR, Estimated: >60 mL/min (ref >60)
Glucose, Bld: 95 mg/dL (ref 70–99)
Potassium: 4.4 mmol/L (ref 3.5–5.1)
Sodium: 136 mmol/L (ref 135–145)
Total Bilirubin: 0.3 mg/dL (ref 0.3–1.2)
Total Protein: 7 g/dL (ref 6.5–8.1)
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
## 2022-07-20 ENCOUNTER — Other Ambulatory Visit (INDEPENDENT_AMBULATORY_CARE_PROVIDER_SITE_OTHER): Payer: Medicare Other

## 2022-07-20 DIAGNOSIS — I10 Essential (primary) hypertension: Secondary | ICD-10-CM

## 2022-07-20 DIAGNOSIS — E039 Hypothyroidism, unspecified: Secondary | ICD-10-CM

## 2022-07-20 LAB — BASIC METABOLIC PANEL
BUN: 22 mg/dL (ref 6–23)
CO2: 31 mEq/L (ref 19–32)
Calcium: 9.1 mg/dL (ref 8.4–10.5)
Chloride: 99 mEq/L (ref 96–112)
Creatinine, Ser: 0.78 mg/dL (ref 0.40–1.20)
GFR: 73.79 mL/min (ref >60.00)
Glucose, Bld: 85 mg/dL (ref 70–99)
Potassium: 4.5 mEq/L (ref 3.5–5.1)
Sodium: 137 mEq/L (ref 135–145)

## 2022-07-20 LAB — TSH: TSH: 0.66 u[IU]/mL (ref 0.35–5.50)

## 2022-07-22 ENCOUNTER — Other Ambulatory Visit: Payer: Self-pay | Admitting: Family Medicine

## 2022-07-22 DIAGNOSIS — F341 Dysthymic disorder: Secondary | ICD-10-CM

## 2022-07-22 DIAGNOSIS — G6289 Other specified polyneuropathies: Secondary | ICD-10-CM

## 2022-07-31 ENCOUNTER — Ambulatory Visit
Admission: RE | Admit: 2022-07-31 | Discharge: 2022-07-31 | Disposition: A | Discharge status: Home / Self Care | Payer: Medicare Other | Source: Ambulatory Visit | Attending: Family Medicine | Admitting: Family Medicine

## 2022-07-31 VITALS — BP 112/75 | HR 98 | Temp 98.6°F | Resp 20 | Ht 67.0 in | Wt 264.0 lb

## 2022-07-31 DIAGNOSIS — N3000 Acute cystitis without hematuria: Secondary | ICD-10-CM

## 2022-07-31 LAB — POCT URINALYSIS DIP (MANUAL ENTRY)
Bilirubin, UA: NEGATIVE
Blood, UA: NEGATIVE
Glucose, UA: NEGATIVE mg/dL
Ketones, POC UA: NEGATIVE mg/dL
Nitrite, UA: POSITIVE — AB
Protein Ur, POC: NEGATIVE mg/dL
Spec Grav, UA: <=1.005 — AB (ref 1.010–1.025)
Urobilinogen, UA: 0.2 E.U./dL
pH, UA: 5.5 (ref 5.0–8.0)

## 2022-07-31 MED ORDER — CEPHALEXIN 500 MG PO CAPS: 1000.0000 mg | ORAL_CAPSULE | Freq: Two times a day (BID) | ORAL | 0 refills | Status: AC

## 2022-07-31 NOTE — Discharge Instructions (Addendum)
Advised patient to take medication as directed with food to completion.  Encouraged patient to increase daily water intake to 64 ounces per day while taking this medication.  Advised if symptoms worsen and/or unresolved please follow-up with PCP or here for further evaluation.  Advised we will follow-up with urine culture results once received.
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
## 2022-07-31 NOTE — ED Triage Notes (Signed)
Pt presents to Urgent Care with c/o lower abdominal cramping and urinary frequency w/ hesitancy since last night.

## 2022-08-02 LAB — URINE CULTURE: Culture: >=100000 — AB

## 2022-08-08 DIAGNOSIS — Z1231 Encounter for screening mammogram for malignant neoplasm of breast: Secondary | ICD-10-CM | POA: Diagnosis not present

## 2022-08-11 ENCOUNTER — Encounter: Payer: Self-pay | Admitting: Obstetrics and Gynecology

## 2022-09-12 DIAGNOSIS — M25562 Pain in left knee: Secondary | ICD-10-CM | POA: Diagnosis not present

## 2022-09-23 ENCOUNTER — Ambulatory Visit
Admission: RE | Admit: 2022-09-23 | Discharge: 2022-09-23 | Disposition: A | Discharge status: Home / Self Care | Payer: Medicare Other | Source: Ambulatory Visit | Attending: Family Medicine | Admitting: Family Medicine

## 2022-09-23 VITALS — BP 120/77 | HR 72 | Temp 98.7°F | Resp 18 | Ht 67.0 in | Wt 263.0 lb

## 2022-09-23 DIAGNOSIS — H01009 Unspecified blepharitis unspecified eye, unspecified eyelid: Secondary | ICD-10-CM | POA: Diagnosis not present

## 2022-09-23 DIAGNOSIS — N3000 Acute cystitis without hematuria: Secondary | ICD-10-CM

## 2022-09-23 LAB — POCT URINALYSIS DIP (MANUAL ENTRY)
Bilirubin, UA: NEGATIVE
Blood, UA: NEGATIVE
Glucose, UA: NEGATIVE mg/dL
Ketones, POC UA: NEGATIVE mg/dL
Nitrite, UA: NEGATIVE
Protein Ur, POC: NEGATIVE mg/dL
Spec Grav, UA: 1.01 (ref 1.010–1.025)
Urobilinogen, UA: 0.2 E.U./dL
pH, UA: 6 (ref 5.0–8.0)

## 2022-09-23 MED ORDER — FLUCONAZOLE 150 MG PO TABS: 150.0000 mg | ORAL_TABLET | Freq: Every day | ORAL | 0 refills | Status: DC

## 2022-09-23 MED ORDER — CEPHALEXIN 500 MG PO CAPS: 500.0000 mg | ORAL_CAPSULE | Freq: Two times a day (BID) | ORAL | 0 refills | Status: DC

## 2022-09-23 MED ORDER — ERYTHROMYCIN 5 MG/GM OP OINT: 1.0000 | TOPICAL_OINTMENT | Freq: Every day | OPHTHALMIC | 1 refills | Status: DC

## 2022-09-23 NOTE — ED Triage Notes (Signed)
Woke up with eye pain this am  Hx of Blepharitis Frequency & urgency since Thursday- worse last night Drinking cranberry juice & water
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
## 2022-09-23 NOTE — Discharge Instructions (Addendum)
Take the keflex 2 x a day Continue to drink lots of water Take the diflucan at first sign of yeast infection May repeat if needed May use the erythromycin ointment nightly to prevent chronic blepharitis

## 2022-09-24 LAB — URINE CULTURE: Culture: <10000 — AB

## 2022-09-25 ENCOUNTER — Encounter (HOSPITAL_COMMUNITY): Payer: Medicare Other

## 2022-09-26 DIAGNOSIS — H01002 Unspecified blepharitis right lower eyelid: Secondary | ICD-10-CM | POA: Diagnosis not present

## 2022-09-29 DIAGNOSIS — H01002 Unspecified blepharitis right lower eyelid: Secondary | ICD-10-CM | POA: Diagnosis not present

## 2022-10-02 ENCOUNTER — Ambulatory Visit: Admit: 2022-10-02 | Payer: Medicare Other | Admitting: Orthopedic Surgery

## 2022-10-02 ENCOUNTER — Other Ambulatory Visit: Payer: Self-pay | Admitting: Family Medicine

## 2022-10-02 DIAGNOSIS — E782 Mixed hyperlipidemia: Secondary | ICD-10-CM

## 2022-10-02 SURGERY — ARTHROPLASTY, KNEE, TOTAL
Anesthesia: Choice | Site: Knee | Laterality: Right

## 2022-10-14 NOTE — Patient Instructions (Incomplete)
It was great to see you again today I will be in touch with your thyroid and urine culture as well asap I will figure out how to get your CT chest ordered- Kathryne Sharper
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
## 2022-10-16 ENCOUNTER — Other Ambulatory Visit: Payer: Self-pay | Admitting: *Deleted

## 2022-10-16 MED ORDER — BUDESONIDE-FORMOTEROL FUMARATE 80-4.5 MCG/ACT IN AERO: 2.0000 | INHALATION_SPRAY | Freq: Two times a day (BID) | RESPIRATORY_TRACT | 12 refills | Status: DC

## 2022-10-18 ENCOUNTER — Ambulatory Visit (INDEPENDENT_AMBULATORY_CARE_PROVIDER_SITE_OTHER): Payer: Medicare Other | Admitting: Family Medicine

## 2022-10-18 ENCOUNTER — Encounter: Payer: Self-pay | Admitting: Family Medicine

## 2022-10-18 VITALS — BP 110/60 | HR 96 | Temp 97.8°F | Resp 18 | Ht 66.0 in

## 2022-10-18 DIAGNOSIS — Z720 Tobacco use: Secondary | ICD-10-CM

## 2022-10-18 DIAGNOSIS — R238 Other skin changes: Secondary | ICD-10-CM | POA: Diagnosis not present

## 2022-10-18 DIAGNOSIS — G6289 Other specified polyneuropathies: Secondary | ICD-10-CM

## 2022-10-18 DIAGNOSIS — N3946 Mixed incontinence: Secondary | ICD-10-CM | POA: Diagnosis not present

## 2022-10-18 DIAGNOSIS — E782 Mixed hyperlipidemia: Secondary | ICD-10-CM

## 2022-10-18 DIAGNOSIS — Z122 Encounter for screening for malignant neoplasm of respiratory organs: Secondary | ICD-10-CM

## 2022-10-18 DIAGNOSIS — J8489 Other specified interstitial pulmonary diseases: Secondary | ICD-10-CM

## 2022-10-18 DIAGNOSIS — C541 Malignant neoplasm of endometrium: Secondary | ICD-10-CM | POA: Diagnosis not present

## 2022-10-18 DIAGNOSIS — E039 Hypothyroidism, unspecified: Secondary | ICD-10-CM | POA: Diagnosis not present

## 2022-10-18 DIAGNOSIS — J42 Unspecified chronic bronchitis: Secondary | ICD-10-CM

## 2022-10-18 DIAGNOSIS — I10 Essential (primary) hypertension: Secondary | ICD-10-CM

## 2022-10-18 LAB — TSH: TSH: 0.49 u[IU]/mL (ref 0.35–5.50)

## 2022-10-18 MED ORDER — NEOMYCIN-POLYMYXIN-DEXAMETH 3.5-10000-0.1 OP OINT: 1.0000 | TOPICAL_OINTMENT | Freq: Two times a day (BID) | OPHTHALMIC | 1 refills | Status: DC | PRN

## 2022-10-20 ENCOUNTER — Other Ambulatory Visit: Payer: Self-pay | Admitting: Family Medicine

## 2022-10-20 ENCOUNTER — Telehealth: Payer: Self-pay | Admitting: Family Medicine

## 2022-10-20 ENCOUNTER — Encounter: Payer: Self-pay | Admitting: Family Medicine

## 2022-10-20 DIAGNOSIS — G6289 Other specified polyneuropathies: Secondary | ICD-10-CM

## 2022-10-20 DIAGNOSIS — N3946 Mixed incontinence: Secondary | ICD-10-CM

## 2022-10-20 LAB — URINE CULTURE
MICRO NUMBER:: 14626332
SPECIMEN QUALITY:: ADEQUATE

## 2022-10-20 MED ORDER — AMOXICILLIN-POT CLAVULANATE 500-125 MG PO TABS: 1.0000 | ORAL_TABLET | Freq: Three times a day (TID) | ORAL | 0 refills | Status: DC

## 2022-10-20 NOTE — Addendum Note (Signed)
Addended by: Lamar Blinks C on: 10/20/2022 01:46 PM   Modules accepted: Orders

## 2022-10-20 NOTE — Telephone Encounter (Signed)
Contacted Renard Hamper to schedule their annual wellness visit. Appointment made for 10/30/2022.  Sherol Dade; Care Guide Ambulatory Clinical [facility] Direct Dial: [phone]

## 2022-10-21 MED ORDER — CEPHALEXIN 500 MG PO CAPS: 500.0000 mg | ORAL_CAPSULE | Freq: Two times a day (BID) | ORAL | 0 refills | Status: DC

## 2022-10-25 ENCOUNTER — Ambulatory Visit (INDEPENDENT_AMBULATORY_CARE_PROVIDER_SITE_OTHER): Payer: Medicare Other

## 2022-10-25 DIAGNOSIS — Z720 Tobacco use: Secondary | ICD-10-CM | POA: Diagnosis not present

## 2022-10-25 DIAGNOSIS — J42 Unspecified chronic bronchitis: Secondary | ICD-10-CM

## 2022-10-25 DIAGNOSIS — J432 Centrilobular emphysema: Secondary | ICD-10-CM | POA: Diagnosis not present

## 2022-10-25 DIAGNOSIS — J849 Interstitial pulmonary disease, unspecified: Secondary | ICD-10-CM

## 2022-10-25 DIAGNOSIS — J8489 Other specified interstitial pulmonary diseases: Secondary | ICD-10-CM

## 2022-10-26 ENCOUNTER — Encounter: Payer: Self-pay | Admitting: Family Medicine

## 2022-10-30 ENCOUNTER — Other Ambulatory Visit: Payer: Self-pay | Admitting: Family Medicine

## 2022-10-30 ENCOUNTER — Ambulatory Visit (INDEPENDENT_AMBULATORY_CARE_PROVIDER_SITE_OTHER): Payer: Medicare Other | Admitting: *Deleted

## 2022-10-30 DIAGNOSIS — Z0000 Encounter for general adult medical examination without abnormal findings: Secondary | ICD-10-CM | POA: Diagnosis not present

## 2022-10-30 DIAGNOSIS — I10 Essential (primary) hypertension: Secondary | ICD-10-CM

## 2022-10-30 DIAGNOSIS — F341 Dysthymic disorder: Secondary | ICD-10-CM
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
## 2022-10-30 NOTE — Patient Instructions (Signed)
Shelly Mosley , Thank you for taking time to come for your Medicare Wellness Visit. I appreciate your ongoing commitment to your health goals. Please review the following plan we discussed and let me know if I can assist you in the future.     This is a list of the screening recommended for you and due dates:  Health Maintenance  Topic Date Due   COVID-19 Vaccine (7 - 2023-24 season) 08/08/2022   Screening for Lung Cancer  10/25/2023   Medicare Annual Wellness Visit  10/30/2023   DTaP/Tdap/Td vaccine (4 - Td or Tdap) 02/24/2031   Pneumonia Vaccine  Completed   Flu Shot  Completed   DEXA scan (bone density measurement)  Completed   Hepatitis C Screening: USPSTF Recommendation to screen - Ages 62-79 yo.  Completed   Zoster (Shingles) Vaccine  Completed   HPV Vaccine  Aged Out   Colon Cancer Screening  Discontinued    Next appointment: Follow up in one year for your annual wellness visit.   Preventive Care 77 Years and Older, Female Preventive care refers to lifestyle choices and visits with your health care provider that can promote health and wellness. What does preventive care include? A yearly physical exam. This is also called an annual well check. Dental exams once or twice a year. Routine eye exams. Ask your health care provider how often you should have your eyes checked. Personal lifestyle choices, including: Daily care of your teeth and gums. Regular physical activity. Eating a healthy diet. Avoiding tobacco and drug use. Limiting alcohol use. Practicing safe sex. Taking low-dose aspirin every day. Taking vitamin and mineral supplements as recommended by your health care provider. What happens during an annual well check? The services and screenings done by your health care provider during your annual well check will depend on your age, overall health, lifestyle risk factors, and family history of disease. Counseling  Your health care provider may ask you questions about  your: Alcohol use. Tobacco use. Drug use. Emotional well-being. Home and relationship well-being. Sexual activity. Eating habits. History of falls. Memory and ability to understand (cognition). Work and work Statistician. Reproductive health. Screening  You may have the following tests or measurements: Height, weight, and BMI. Blood pressure. Lipid and cholesterol levels. These may be checked every 5 years, or more frequently if you are over 18 years old. Skin check. Lung cancer screening. You may have this screening every year starting at age 45 if you have a 30-pack-year history of smoking and currently smoke or have quit within the past 15 years. Fecal occult blood test (FOBT) of the stool. You may have this test every year starting at age 65. Flexible sigmoidoscopy or colonoscopy. You may have a sigmoidoscopy every 5 years or a colonoscopy every 10 years starting at age 64. Hepatitis C blood test. Hepatitis B blood test. Sexually transmitted disease (STD) testing. Diabetes screening. This is done by checking your blood sugar (glucose) after you have not eaten for a while (fasting). You may have this done every 1-3 years. Bone density scan. This is done to screen for osteoporosis. You may have this done starting at age 58. Mammogram. This may be done every 1-2 years. Talk to your health care provider about how often you should have regular mammograms. Talk with your health care provider about your test results, treatment options, and if necessary, the need for more tests. Vaccines  Your health care provider may recommend certain vaccines, such as: Influenza vaccine. This is  recommended every year. Tetanus, diphtheria, and acellular pertussis (Tdap, Td) vaccine. You may need a Td booster every 10 years. Zoster vaccine. You may need this after age 52. Pneumococcal 13-valent conjugate (PCV13) vaccine. One dose is recommended after age 85. Pneumococcal polysaccharide (PPSV23) vaccine.  One dose is recommended after age 29. Talk to your health care provider about which screenings and vaccines you need and how often you need them. This information is not intended to replace advice given to you by your health care provider. Make sure you discuss any questions you have with your health care provider. Document Released: 09/03/2015 Document Revised: 04/26/2016 Document Reviewed: 06/08/2015 Elsevier Interactive Patient Education  2017 [place] Prevention in the Home Falls can cause injuries. They can happen to people of all ages. There are many things you can do to make your home safe and to help prevent falls. What can I do on the outside of my home? Regularly fix the edges of walkways and driveways and fix any cracks. Remove anything that might make you trip as you walk through a door, such as a raised step or threshold. Trim any bushes or trees on the path to your home. Use bright outdoor lighting. Clear any walking paths of anything that might make someone trip, such as rocks or tools. Regularly check to see if handrails are loose or broken. Make sure that both sides of any steps have handrails. Any raised decks and porches should have guardrails on the edges. Have any leaves, snow, or ice cleared regularly. Use sand or salt on walking paths during winter. Clean up any spills in your garage right away. This includes oil or grease spills. What can I do in the bathroom? Use night lights. Install grab bars by the toilet and in the tub and shower. Do not use towel bars as grab bars. Use non-skid mats or decals in the tub or shower. If you need to sit down in the shower, use a plastic, non-slip stool. Keep the floor dry. Clean up any water that spills on the floor as soon as it happens. Remove soap buildup in the tub or shower regularly. Attach bath mats securely with double-sided non-slip rug tape. Do not have throw rugs and other things on the floor that can make  you trip. What can I do in the bedroom? Use night lights. Make sure that you have a light by your bed that is easy to reach. Do not use any sheets or blankets that are too big for your bed. They should not hang down onto the floor. Have a firm chair that has side arms. You can use this for support while you get dressed. Do not have throw rugs and other things on the floor that can make you trip. What can I do in the kitchen? Clean up any spills right away. Avoid walking on wet floors. Keep items that you use a lot in easy-to-reach places. If you need to reach something above you, use a strong step stool that has a grab bar. Keep electrical cords out of the way. Do not use floor polish or wax that makes floors slippery. If you must use wax, use non-skid floor wax. Do not have throw rugs and other things on the floor that can make you trip. What can I do with my stairs? Do not leave any items on the stairs. Make sure that there are handrails on both sides of the stairs and use them. Fix handrails that are  broken or loose. Make sure that handrails are as long as the stairways. Check any carpeting to make sure that it is firmly attached to the stairs. Fix any carpet that is loose or worn. Avoid having throw rugs at the top or bottom of the stairs. If you do have throw rugs, attach them to the floor with carpet tape. Make sure that you have a light switch at the top of the stairs and the bottom of the stairs. If you do not have them, ask someone to add them for you. What else can I do to help prevent falls? Wear shoes that: Do not have high heels. Have rubber bottoms. Are comfortable and fit you well. Are closed at the toe. Do not wear sandals. If you use a stepladder: Make sure that it is fully opened. Do not climb a closed stepladder. Make sure that both sides of the stepladder are locked into place. Ask someone to hold it for you, if possible. Clearly mark and make sure that you can  see: Any grab bars or handrails. First and last steps. Where the edge of each step is. Use tools that help you move around (mobility aids) if they are needed. These include: Canes. Walkers. Scooters. Crutches. Turn on the lights when you go into a dark area. Replace any light bulbs as soon as they burn out. Set up your furniture so you have a clear path. Avoid moving your furniture around. If any of your floors are uneven, fix them. If there are any pets around you, be aware of where they are. Review your medicines with your doctor. Some medicines can make you feel dizzy. This can increase your chance of falling. Ask your doctor what other things that you can do to help prevent falls. This information is not intended to replace advice given to you by your health care provider. Make sure you discuss any questions you have with your health care provider. Document Released: 06/03/2009 Document Revised: 01/13/2016 Document Reviewed: 09/11/2014 Elsevier Interactive Patient Education  2017 [employer].

## 2022-11-01 DIAGNOSIS — H524 Presbyopia: Secondary | ICD-10-CM | POA: Diagnosis not present

## 2022-11-01 DIAGNOSIS — H01002 Unspecified blepharitis right lower eyelid: Secondary | ICD-10-CM | POA: Diagnosis not present

## 2022-11-01 DIAGNOSIS — Z961 Presence of intraocular lens: Secondary | ICD-10-CM | POA: Diagnosis not present

## 2022-11-02 ENCOUNTER — Other Ambulatory Visit: Payer: Self-pay | Admitting: Family Medicine

## 2022-11-02 DIAGNOSIS — I890 Lymphedema, not elsewhere classified: Secondary | ICD-10-CM

## 2022-11-07 DIAGNOSIS — Z23 Encounter for immunization: Secondary | ICD-10-CM | POA: Diagnosis not present

## 2022-11-14 ENCOUNTER — Encounter (HOSPITAL_COMMUNITY): Payer: Self-pay
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
## 2022-11-14 NOTE — Patient Instructions (Addendum)
DUE TO COVID-19 ONLY TWO VISITORS  (aged 77 and older)  ARE ALLOWED TO COME WITH YOU AND STAY IN THE WAITING ROOM ONLY DURING PRE OP AND PROCEDURE.   **NO VISITORS ARE ALLOWED IN THE SHORT STAY AREA OR RECOVERY ROOM!!**  IF YOU WILL BE ADMITTED INTO THE HOSPITAL YOU ARE ALLOWED ONLY FOUR SUPPORT PEOPLE DURING VISITATION HOURS ONLY (7 AM -8PM)   The support person(s) must pass our screening, gel in and out, and wear a mask at all times, including in the patient's room. Patients must also wear a mask when staff or their support person are in the room. Visitors GUEST BADGE MUST BE WORN VISIBLY  One adult visitor may remain with you overnight and MUST be in the room by 8 P.M.     Your procedure is scheduled on: 12/04/22   Report to [facility] Main Entrance    Report to admitting at  5:50 AM   Call this number if you have problems the morning of surgery [phone]   Do not eat food :After Midnight.   After Midnight you may have the following liquids until _5:15_ AM/  DAY OF SURGERY  Water Black Coffee (sugar ok, NO MILK/CREAM OR CREAMERS)  Tea (sugar ok, NO MILK/CREAM OR CREAMERS) regular and decaf                             Plain Jell-O (NO RED)                                           Fruit ices (not with fruit pulp, NO RED)                                     Popsicles (NO RED)                                                                  Juice: apple, WHITE grape, WHITE cranberry Sports drinks like Gatorade (NO RED)      The day of surgery:  Drink ONE (1) Pre-Surgery Clear Ensure at 5:00 AM the morning of surgery. Drink in one sitting. Do not sip.  This drink was given to you during your hospital  pre-op appointment visit. Nothing else to drink after completing the  Pre-Surgery Clear Ensure at 5:15 AM          If you have questions, please contact your surgeon's office.      Oral Hygiene is also important to reduce your risk of infection.                                     Remember - BRUSH YOUR TEETH THE MORNING OF SURGERY WITH YOUR REGULAR TOOTHPASTE  DENTURES WILL BE REMOVED PRIOR TO SURGERY PLEASE DO NOT APPLY "Poly grip" OR ADHESIVES!!!   Do NOT smoke after Midnight   Take these medicines the morning of surgery with A SIP OF WATER: Use your  inhalers and bring them with you                                                                                                                            Use your eye drops as usual                                                                                                                            Lyrica                                                                                                                            Venlafaxine                                                                                                                            Atorvastatin  Levothyroxine   Bring CPAP mask and tubing day of surgery.                              You may not have any metal on your body including hair pins, jewelry, and body piercing             Do not wear make-up, lotions, powders, perfumes/cologne, or deodorant  Do not wear nail polish including gel and S&S, artificial/acrylic nails, or any other type of covering on natural nails including finger and toenails. If you have artificial nails, gel coating, etc. that needs to be removed by a nail salon please have this removed prior to surgery or surgery may need to be canceled/ delayed if the surgeon/ anesthesia feels like they are unable to be safely monitored.   Do not shave  48 hours prior to surgery.     Do not bring valuables to the hospital. [place].   Contacts, glasses, or bridgework may not be worn into surgery.   Bring small overnight bag day of  surgery.       Patients discharged on the day of surgery will not be allowed to drive home.  Someone NEEDS to stay with you for the first 24 hours after anesthesia.   Special Instructions: Bring a copy of your healthcare power of attorney and living will documents  the day of surgery if you haven't scanned them before.              Please read over the following fact sheets you were given: IF YOU HAVE QUESTIONS ABOUT YOUR PRE-OP INSTRUCTIONS PLEASE CALL [phone]    [facility] - Preparing for Surgery Before surgery, you can play an important role.  Because skin is not sterile, your skin needs to be as free of germs as possible.  You can reduce the number of germs on your skin by washing with CHG (chlorahexidine gluconate) soap before surgery.  CHG is an antiseptic cleaner which kills germs and bonds with the skin to continue killing germs even after washing. Please DO NOT use if you have an allergy to CHG or antibacterial soaps.  If your skin becomes reddened/irritated stop using the CHG and inform your nurse when you arrive at Short Stay. Do not shave (including legs and underarms) for at least 48 hours prior to the first CHG shower.   Please follow these instructions carefully:  1.  Shower with CHG Soap the night before surgery and the  morning of Surgery.  2.  If you choose to wash your hair, wash your hair first as usual with your  normal  shampoo.  3.  After you shampoo, rinse your hair and body thoroughly to remove the  shampoo.                            4.  Use CHG as you would any other liquid soap.  You can apply chg directly  to the skin and wash                       Gently with a scrungie or clean washcloth.  5.  Apply the CHG Soap to your body ONLY FROM THE NECK DOWN.   Do not use on face/ open  Wound or open sores. Avoid contact with eyes, ears mouth and genitals (private parts).                       Wash face,  Genitals (private parts) with your  normal soap.             6.  Wash thoroughly, paying special attention to the area where your surgery  will be performed.  7.  Thoroughly rinse your body with warm water from the neck down.  8.  DO NOT shower/wash with your normal soap after using and rinsing off  the CHG Soap.             9.  Pat yourself dry with a clean towel.            10.  Wear clean pajamas.            11.  Place clean sheets on your bed the night of your first shower and do not  sleep with pets. Day of Surgery : Do not apply any lotions/deodorants the morning of surgery.  Please wear clean clothes to the hospital/surgery center.  FAILURE TO FOLLOW THESE INSTRUCTIONS MAY RESULT IN THE CANCELLATION OF YOUR SURGERY  PATIENT SIGNATURE_________________________________   ________________________________________________________________________  Adam Phenix  An incentive spirometer is a tool that can help keep your lungs clear and active. This tool measures how well you are filling your lungs with each breath. Taking long deep breaths may help reverse or decrease the chance of developing breathing (pulmonary) problems (especially infection) following: A long period of time when you are unable to move or be active. BEFORE THE PROCEDURE  If the spirometer includes an indicator to show your best effort, your nurse or respiratory therapist will set it to a desired goal. If possible, sit up straight or lean slightly forward. Try not to slouch. Hold the incentive spirometer in an upright position. INSTRUCTIONS FOR USE  Sit on the edge of your bed if possible, or sit up as far as you can in bed or on a chair. Hold the incentive spirometer in an upright position. Breathe out normally. Place the mouthpiece in your mouth and seal your lips tightly around it. Breathe in slowly and as deeply as possible, raising the piston or the ball toward the top of the column. Hold your breath for 3-5 seconds or for as long as  possible. Allow the piston or ball to fall to the bottom of the column. Remove the mouthpiece from your mouth and breathe out normally. Rest for a few seconds and repeat Steps 1 through 7 at least 10 times every 1-2 hours when you are awake. Take your time and take a few normal breaths between deep breaths. The spirometer may include an indicator to show your best effort. Use the indicator as a goal to work toward during each repetition. After each set of 10 deep breaths, practice coughing to be sure your lungs are clear. If you have an incision (the cut made at the time of surgery), support your incision when coughing by placing a pillow or rolled up towels firmly against it. Once you are able to get out of bed, walk around indoors and cough well. You may stop using the incentive spirometer when instructed by your caregiver.  RISKS AND COMPLICATIONS Take your time so you do not get dizzy or light-headed. If you are in pain, you may need to take or ask for pain medication before  doing incentive spirometry. It is harder to take a deep breath if you are having pain. AFTER USE Rest and breathe slowly and easily. It can be helpful to keep track of a log of your progress. Your caregiver can provide you with a simple table to help with this. If you are using the spirometer at home, follow these instructions: [place] IF:  You are having difficultly using the spirometer. You have trouble using the spirometer as often as instructed. Your pain medication is not giving enough relief while using the spirometer. You develop fever of 100.5 F (38.1 C) or higher. SEEK IMMEDIATE MEDICAL CARE IF:  You cough up bloody sputum that had not been present before. You develop fever of 102 F (38.9 C) or greater. You develop worsening pain at or near the incision site. MAKE SURE YOU:  Understand these instructions. Will watch your condition. Will get help right away if you are not doing well or get  worse. Document Released: 12/18/2006 Document Revised: 10/30/2011 Document Reviewed: 02/18/2007 [facility] Patient Information 2014 [place], [place].   ________________________________________________________________________

## 2022-11-15 ENCOUNTER — Telehealth: Payer: Self-pay

## 2022-11-15 NOTE — Telephone Encounter (Signed)
ATC X1 LVM for patient to call the office back. Received Surgical Clearance from [facility]. Patient will need apt with an App or Dr. Halford Chessman before 11/29/22

## 2022-11-15 NOTE — Progress Notes (Signed)
Surgery orders requested via Epic inbox. °

## 2022-11-16 NOTE — Telephone Encounter (Signed)
ATC X1 LVM asking patient to call the office back. To get scheduled for surgical clearance

## 2022-11-17 NOTE — Telephone Encounter (Signed)
Contacted patient several times in regards to surgical clearance from [facility]. Patient has made no attempt to contact our office. I will send clearance request back to Dr. Cherylann Parr office and advise they re-send form if patient plans to move forward with surgery.  Closing encounter

## 2022-11-17 NOTE — Telephone Encounter (Signed)
ATC X1 LVM for patient to call the office back 

## 2022-11-20 NOTE — Progress Notes (Signed)
Second request for pre op orders: left voicemail with Claiborne Billings.

## 2022-11-22 ENCOUNTER — Other Ambulatory Visit: Payer: Self-pay

## 2022-11-22 ENCOUNTER — Encounter (HOSPITAL_COMMUNITY): Payer: Self-pay

## 2022-11-22 ENCOUNTER — Encounter (HOSPITAL_COMMUNITY)
Admission: RE | Admit: 2022-11-22 | Discharge: 2022-11-22 | Disposition: A | Discharge status: Home / Self Care | Payer: Medicare Other | Source: Ambulatory Visit | Attending: Orthopedic Surgery | Admitting: Orthopedic Surgery

## 2022-11-22 DIAGNOSIS — J449 Chronic obstructive pulmonary disease, unspecified: Secondary | ICD-10-CM | POA: Insufficient documentation

## 2022-11-22 DIAGNOSIS — K219 Gastro-esophageal reflux disease without esophagitis: Secondary | ICD-10-CM | POA: Insufficient documentation

## 2022-11-22 DIAGNOSIS — Z01812 Encounter for preprocedural laboratory examination: Secondary | ICD-10-CM | POA: Diagnosis not present

## 2022-11-22 DIAGNOSIS — Z01818 Encounter for other preprocedural examination: Secondary | ICD-10-CM

## 2022-11-22 DIAGNOSIS — I10 Essential (primary) hypertension: Secondary | ICD-10-CM | POA: Insufficient documentation

## 2022-11-22 DIAGNOSIS — E039 Hypothyroidism, unspecified: Secondary | ICD-10-CM | POA: Insufficient documentation

## 2022-11-22 DIAGNOSIS — Z87891 Personal history of nicotine dependence: Secondary | ICD-10-CM | POA: Diagnosis not present

## 2022-11-22 DIAGNOSIS — M1711 Unilateral primary osteoarthritis, right knee: Secondary | ICD-10-CM | POA: Insufficient documentation

## 2022-11-22 DIAGNOSIS — G4733 Obstructive sleep apnea (adult) (pediatric): Secondary | ICD-10-CM | POA: Diagnosis not present

## 2022-11-22 DIAGNOSIS — J849 Interstitial pulmonary disease, unspecified: Secondary | ICD-10-CM | POA: Diagnosis not present

## 2022-11-22 HISTORY — DX: Other chest pain: R07.89

## 2022-11-22 HISTORY — DX: Pleurodynia: R07.81

## 2022-11-22 LAB — CBC
HCT: 38.5 % (ref 36.0–46.0)
Hemoglobin: 12.4 g/dL (ref 12.0–15.0)
MCH: 30.8 pg (ref 26.0–34.0)
MCHC: 32.2 g/dL (ref 30.0–36.0)
MCV: 95.5 fL (ref 80.0–100.0)
Platelets: 218 10*3/uL (ref 150–400)
RBC: 4.03 MIL/uL (ref 3.87–5.11)
RDW: 12.3 % (ref 11.5–15.5)
WBC: 6.1 10*3/uL (ref 4.0–10.5)
nRBC: 0 % (ref 0.0–0.2)

## 2022-11-22 LAB — SURGICAL PCR SCREEN
MRSA, PCR: NEGATIVE
Staphylococcus aureus: NEGATIVE

## 2022-11-22 LAB — BASIC METABOLIC PANEL
Anion gap: 7 (ref 5–15)
BUN: 20 mg/dL (ref 8–23)
CO2: 29 mmol/L (ref 22–32)
Calcium: 9.6 mg/dL (ref 8.9–10.3)
Chloride: 101 mmol/L (ref 98–111)
Creatinine, Ser: 0.79 mg/dL (ref 0.44–1.00)
GFR, Estimated: >60 mL/min (ref >60)
Glucose, Bld: 92 mg/dL (ref 70–99)
Potassium: 4.7 mmol/L (ref 3.5–5.1)
Sodium: 137 mmol/L (ref 135–145)

## 2022-11-22 NOTE — Progress Notes (Signed)
Anesthesia note:    PCP - Dr. Lenna Sciara. [place] Cardiologist -none Other- Pulmonologist- Dr D. Lake Bells  Chest x-ray - CT chest 10/26/22 EKG - 06/26/22 Stress Test - no ECHO - 12/29/20 Cardiac Cath - no CABG-no Pacemaker/ICD device last checked:no  Sleep Study - yes-negative CPAP - no  Pt is pre diabetic-no CBG at PAT visit- Fasting Blood Sugar at home- Checks Blood Sugar _____  Blood Thinner:no Blood Thinner Instructions: Aspirin Instructions: Last Dose:  Anesthesia review: Yes reason:  Patient denies shortness of breath, fever, cough and chest pain at PAT appointment. Pt has SOB sometimes and uses her inhalers. She will see her Pulmonologist on 11/23/22   Patient verbalized understanding of instructions that were given to them at the PAT appointment. Patient was also instructed that they will need to review over the PAT instructions again at home before surgery.yes

## 2022-11-23 ENCOUNTER — Encounter: Payer: Self-pay | Admitting: Nurse Practitioner

## 2022-11-23 ENCOUNTER — Ambulatory Visit (INDEPENDENT_AMBULATORY_CARE_PROVIDER_SITE_OTHER): Payer: Medicare Other

## 2022-11-23 ENCOUNTER — Ambulatory Visit (INDEPENDENT_AMBULATORY_CARE_PROVIDER_SITE_OTHER): Payer: Medicare Other | Admitting: Nurse Practitioner

## 2022-11-23 VITALS — BP 128/80 | HR 67 | Ht 67.0 in | Wt 267.2 lb

## 2022-11-23 DIAGNOSIS — J309 Allergic rhinitis, unspecified: Secondary | ICD-10-CM | POA: Diagnosis not present

## 2022-11-23 DIAGNOSIS — J849 Interstitial pulmonary disease, unspecified: Secondary | ICD-10-CM

## 2022-11-23 DIAGNOSIS — J984 Other disorders of lung: Secondary | ICD-10-CM | POA: Diagnosis not present

## 2022-11-23 DIAGNOSIS — J42 Unspecified chronic bronchitis: Secondary | ICD-10-CM | POA: Diagnosis not present

## 2022-11-23 DIAGNOSIS — Z01811 Encounter for preprocedural respiratory examination: Secondary | ICD-10-CM

## 2022-11-23 NOTE — Patient Instructions (Addendum)
Continue Symbicort 2 puffs Twice daily. Brush tongue and rinse mouth afterwards  Continue Albuterol inhaler 2 puffs  every 6 hours as needed for shortness of breath or wheezing  Try switching from zyrtec to Xyzal to see if that helps with your allergies   Good luck with your surgery! Use your inhaler the morning of your surgery and take them with you to the hospital. Out of bed as soon as possible after your procedure. Use incentive spirometer 10 times an hour while awake during your recovery period.  Follow up in 6 months with Dr. Halford Chessman. If symptoms do not improve or worsen, please contact office for sooner follow up or seek emergency care.

## 2022-11-23 NOTE — Assessment & Plan Note (Signed)
Stable and compensated on current regimen. No recent flares requiring steroids or abx. No recent hospitalizations. Action plan in place.  Patient Instructions  Continue Symbicort 2 puffs Twice daily. Brush tongue and rinse mouth afterwards  Continue Albuterol inhaler 2 puffs  every 6 hours as needed for shortness of breath or wheezing  Try switching from zyrtec to Xyzal to see if that helps with your allergies   Good luck with your surgery! Use your inhaler the morning of your surgery and take them with you to the hospital. Out of bed as soon as possible after your procedure. Use incentive spirometer 10 times an hour while awake during your recovery period.  Follow up in 6 months with Dr. Halford Chessman. If symptoms do not improve or worsen, please contact office for sooner follow up or seek emergency care.

## 2022-11-23 NOTE — Assessment & Plan Note (Signed)
Moderate risk. Factors that increase the risk for postoperative pulmonary complications are ILD, COPD, mild OSA, age, obesity  Respiratory complications generally occur in 1% of ASA Class I patients, 5% of ASA Class II and 10% of ASA Class III-IV patients These complications rarely result in mortality and include postoperative pneumonia, atelectasis, pulmonary embolism, ARDS and increased time requiring postoperative mechanical ventilation.   Overall, I recommend proceeding with the surgery if the risk for respiratory complications are outweighed by the potential benefits. This will need to be discussed between the patient and surgeon.   To reduce risks of respiratory complications, I recommend: --Pre- and post-operative incentive spirometry performed frequently while awake --Inpatient use of currently prescribed bronchodilators --Short duration of surgery as much as possible and avoid paralytic if possible --OOB, encourage mobility post-op, DVT prophylaxis if indicated   1) RISK FOR PROLONGED MECHANICAL VENTILAION - > 48h  1A) Arozullah - Prolonged mech ventilation risk Arozullah Postperative Pulmonary Risk Score - for mech ventilation dependence >48h [employer], Ann Surg 2000, major non-cardiac surgery) Comment Score  Type of surgery - abd ao aneurysm (27), thoracic (21), neurosurgery / upper abdominal / vascular (21), neck (11) R TKA 5  Emergency Surgery - (11)  0  ALbumin < 3 or poor nutritional state - (9) obesity 5  BUN > 30 -  (8)  0  Partial or completely dependent functional status - (7)  0  COPD -  (6) COPD 6  Age - 60 to 69 (4), > 70  (6) 76 6  TOTAL  22  Risk Stratifcation scores  - < 10 (0.5%), 11-19 (1.8%), 20-27 (4.2%), 28-40 (10.1%), >40 (26.6%)  4.2%

## 2022-11-23 NOTE — Assessment & Plan Note (Signed)
Mild, possible NSIP without progression. Stable on recent imaging. Will continue to monitor.

## 2022-11-23 NOTE — Assessment & Plan Note (Addendum)
Slight increase in symptoms despite use of zyrtec. Will trial change to alternative OTC antihistamine.
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
## 2022-11-26 ENCOUNTER — Other Ambulatory Visit: Payer: Self-pay

## 2022-11-26 ENCOUNTER — Ambulatory Visit
Admission: RE | Admit: 2022-11-26 | Discharge: 2022-11-26 | Disposition: A | Discharge status: Home / Self Care | Payer: Medicare Other | Source: Ambulatory Visit | Attending: Family Medicine | Admitting: Family Medicine

## 2022-11-26 VITALS — BP 115/69 | HR 85 | Temp 98.5°F | Resp 18 | Ht 67.0 in | Wt 267.2 lb

## 2022-11-26 DIAGNOSIS — R300 Dysuria: Secondary | ICD-10-CM

## 2022-11-26 LAB — POCT URINALYSIS DIP (MANUAL ENTRY)
Bilirubin, UA: NEGATIVE
Blood, UA: NEGATIVE
Glucose, UA: NEGATIVE mg/dL
Ketones, POC UA: NEGATIVE mg/dL
Leukocytes, UA: NEGATIVE
Nitrite, UA: NEGATIVE
Protein Ur, POC: NEGATIVE mg/dL
Spec Grav, UA: <=1.005 — AB (ref 1.010–1.025)
Urobilinogen, UA: 0.2 E.U./dL
pH, UA: 5.5 (ref 5.0–8.0)

## 2022-11-26 MED ORDER — CEPHALEXIN 500 MG PO CAPS: 500.0000 mg | ORAL_CAPSULE | Freq: Two times a day (BID) | ORAL | 0 refills | Status: AC

## 2022-11-26 NOTE — Progress Notes (Signed)
Reviewed and agree with assessment/plan.   Emonee Winkowski, MD [facility] 11/26/2022, 9:55 AM Pager:  [phone]  
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
## 2022-11-26 NOTE — ED Triage Notes (Signed)
Per pt, " Urinary pressure and cramping w/ burning" Pt took pyridium yesterday, none today  Pt is scheduled for  a total knee replacement on the right next week

## 2022-11-26 NOTE — Discharge Instructions (Addendum)
Patient to take medication as directed with food to completion.  Encourage patient to increase daily water intake to 64 ounces per day while taking this medication.  Advised we will follow-up with urine culture results once received.  Advised if symptoms worsen and/or unresolved please follow-up with PCP for further evaluation.

## 2022-11-27 ENCOUNTER — Encounter (HOSPITAL_COMMUNITY): Payer: Self-pay | Admitting: Physician Assistant

## 2022-11-27 ENCOUNTER — Telehealth: Payer: Self-pay | Admitting: Emergency Medicine

## 2022-11-27 LAB — URINE CULTURE: Culture: <10000 — AB

## 2022-11-27 NOTE — Anesthesia Preprocedure Evaluation (Deleted)
Anesthesia Evaluation    Airway        Dental   Pulmonary former smoker          Cardiovascular hypertension,      Neuro/Psych    GI/Hepatic   Endo/Other    Renal/GU      Musculoskeletal   Abdominal   Peds  Hematology   Anesthesia Other Findings   Reproductive/Obstetrics                             Anesthesia Physical Anesthesia Plan  ASA:   Anesthesia Plan:    Post-op Pain Management:    Induction:   PONV Risk Score and Plan:   Airway Management Planned:   Additional Equipment:   Intra-op Plan:   Post-operative Plan:   Informed Consent:   Plan Discussed with:   Anesthesia Plan Comments: (See PAT note 11/22/22)       Anesthesia Quick Evaluation
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
## 2022-11-27 NOTE — Telephone Encounter (Signed)
Call to see how Shelly Mosley was today  & to see if she had any concerns or questions. Call back # left.

## 2022-12-04 ENCOUNTER — Encounter (HOSPITAL_COMMUNITY): Admission: RE | Payer: Self-pay | Source: Home / Self Care

## 2022-12-04 ENCOUNTER — Encounter: Payer: Self-pay | Admitting: *Deleted

## 2022-12-04 ENCOUNTER — Ambulatory Visit (HOSPITAL_COMMUNITY): Admission: RE | Admit: 2022-12-04 | Payer: Medicare Other | Source: Home / Self Care | Admitting: Orthopedic Surgery

## 2022-12-04 DIAGNOSIS — Z01818 Encounter for other preprocedural examination: Secondary | ICD-10-CM

## 2022-12-04 DIAGNOSIS — I10 Essential (primary) hypertension: Secondary | ICD-10-CM

## 2022-12-04 SURGERY — ARTHROPLASTY, KNEE, TOTAL
Anesthesia: Choice | Site: Knee | Laterality: Right

## 2022-12-06 ENCOUNTER — Encounter: Payer: Self-pay | Admitting: Family Medicine

## 2022-12-07 ENCOUNTER — Ambulatory Visit: Payer: Medicare Other | Admitting: Physical Therapy

## 2023-01-11 ENCOUNTER — Telehealth: Payer: Self-pay | Admitting: Family Medicine

## 2023-01-11 NOTE — Telephone Encounter (Signed)
Patient states she has a knee surgery scheduled for July 15th and they will re-schedule it if she does not lose 10 more pounds. She would like to know if she can get back on Ozempic to lose the last 10 pouns. Please advise.

## 2023-01-11 NOTE — Telephone Encounter (Signed)
Okay to restart 

## 2023-01-11 NOTE — Telephone Encounter (Signed)
Message left on machine to send message.

## 2023-01-18 ENCOUNTER — Telehealth: Payer: Self-pay | Admitting: Family Medicine

## 2023-01-18 NOTE — Telephone Encounter (Signed)
Pt calling to request Ozempic refill. Pt has knee surgery scheduled for 03/05/23 and if she doesn't lose 10 lbs they will cancel her surgery. Please send rx to [facility] [address].

## 2023-01-18 NOTE — Telephone Encounter (Signed)
Tried calling the pt on 5/23 about the same issue- I left her a message and she did not return the phone call. Do you still want her to send an Mychart message about this?  " Can you please give her a call and ask her to send me a mychart message so we can discuss this.  I am gglad to rx for her but not sure if she plans to pay cash? "

## 2023-01-18 NOTE — Telephone Encounter (Signed)
I called patient and left message on her cell phone.  Advised that most insurance companies are no longer paying for GLP-1 drugs for weight loss, only for diabetes.  These medications are often prohibitively expensive for cash pay.  Please send me a message via MyChart and let me know how she would like to proceed

## 2023-02-08 ENCOUNTER — Other Ambulatory Visit: Payer: Self-pay | Admitting: Family Medicine

## 2023-02-08 DIAGNOSIS — E039 Hypothyroidism, unspecified: Secondary | ICD-10-CM
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
## 2023-02-15 ENCOUNTER — Ambulatory Visit
Admission: RE | Admit: 2023-02-15 | Discharge: 2023-02-15 | Disposition: A | Discharge status: Home / Self Care | Payer: Medicare Other | Source: Ambulatory Visit | Attending: Hematology and Oncology | Admitting: Hematology and Oncology

## 2023-02-15 DIAGNOSIS — D0590 Unspecified type of carcinoma in situ of unspecified breast: Secondary | ICD-10-CM

## 2023-02-15 DIAGNOSIS — Z803 Family history of malignant neoplasm of breast: Secondary | ICD-10-CM | POA: Diagnosis not present

## 2023-02-15 MED ORDER — GADOPICLENOL 0.5 MMOL/ML IV SOLN
10.0000 mL | Freq: Once | INTRAVENOUS | Status: AC | PRN
  Administered 2023-02-15: 10 mL via INTRAVENOUS
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
## 2023-02-19 ENCOUNTER — Encounter: Payer: Self-pay | Admitting: Family Medicine

## 2023-02-19 ENCOUNTER — Ambulatory Visit (INDEPENDENT_AMBULATORY_CARE_PROVIDER_SITE_OTHER): Payer: Medicare Other | Admitting: Family Medicine

## 2023-02-19 VITALS — BP 122/80 | HR 64 | Temp 97.6°F | Resp 18 | Ht 66.0 in | Wt 260.2 lb

## 2023-02-19 DIAGNOSIS — E782 Mixed hyperlipidemia: Secondary | ICD-10-CM | POA: Diagnosis not present

## 2023-02-19 DIAGNOSIS — E039 Hypothyroidism, unspecified: Secondary | ICD-10-CM | POA: Diagnosis not present

## 2023-02-19 DIAGNOSIS — Z6841 Body Mass Index (BMI) 40.0 and over, adult: Secondary | ICD-10-CM

## 2023-02-19 DIAGNOSIS — G6289 Other specified polyneuropathies: Secondary | ICD-10-CM | POA: Diagnosis not present

## 2023-02-19 DIAGNOSIS — F341 Dysthymic disorder: Secondary | ICD-10-CM

## 2023-02-19 DIAGNOSIS — I10 Essential (primary) hypertension: Secondary | ICD-10-CM | POA: Diagnosis not present

## 2023-02-19 DIAGNOSIS — B372 Candidiasis of skin and nail: Secondary | ICD-10-CM

## 2023-02-19 LAB — TSH: TSH: <0.01 u[IU]/mL — ABNORMAL LOW (ref 0.35–5.50)

## 2023-02-19 MED ORDER — VENLAFAXINE HCL 50 MG PO TABS: ORAL_TABLET | ORAL | 3 refills | Status: DC

## 2023-02-19 MED ORDER — PREGABALIN 100 MG PO CAPS
ORAL_CAPSULE | ORAL | 1 refills | Status: DC
Start: 2023-02-19 — End: 2023-05-30

## 2023-02-19 MED ORDER — LOSARTAN POTASSIUM 50 MG PO TABS: ORAL_TABLET | ORAL | 3 refills | Status: DC

## 2023-02-19 MED ORDER — LEVOTHYROXINE SODIUM 125 MCG PO TABS: 250.0000 ug | ORAL_TABLET | Freq: Every day | ORAL | 3 refills | Status: DC

## 2023-02-19 MED ORDER — WEGOVY 0.25 MG/0.5ML ~~LOC~~ SOAJ
0.2500 mg | SUBCUTANEOUS | 0 refills | Status: DC
Start: 2023-02-19 — End: 2023-05-26

## 2023-02-19 MED ORDER — NYSTATIN 100000 UNIT/GM EX POWD
1.0000 | Freq: Three times a day (TID) | CUTANEOUS | 1 refills | Status: DC
Start: 2023-02-19 — End: 2023-05-30

## 2023-02-19 MED ORDER — TRAMADOL HCL 50 MG PO TABS
ORAL_TABLET | ORAL | 0 refills | Status: DC
Start: 2023-02-19 — End: 2023-03-06

## 2023-02-19 MED ORDER — ATORVASTATIN CALCIUM 20 MG PO TABS: ORAL_TABLET | ORAL | 3 refills | Status: DC

## 2023-02-19 NOTE — Patient Instructions (Signed)
It was great to see you again today, I will be in touch with your labs soon as possible.  Best of luck with weight loss for your knee surgery.  I can probably find some more GLP-1 samples if you need to stay on it longer  I did order a CT coronary calcium for you, please set this up at your convenience at the med center [facility]

## 2023-02-21 MED ORDER — LEVOTHYROXINE SODIUM 200 MCG PO TABS
200.0000 ug | ORAL_TABLET | Freq: Every day | ORAL | 3 refills | Status: DC
Start: 2023-02-21 — End: 2023-04-18

## 2023-02-21 NOTE — Addendum Note (Signed)
Addended by: Abbe Amsterdam C on: 02/21/2023 01:44 PM   Modules accepted: Orders

## 2023-02-21 NOTE — Patient Instructions (Signed)
DUE TO COVID-19 ONLY TWO VISITORS  (aged 77 and older)  ARE ALLOWED TO COME WITH YOU AND STAY IN THE WAITING ROOM ONLY DURING PRE OP AND PROCEDURE.   **NO VISITORS ARE ALLOWED IN THE SHORT STAY AREA OR RECOVERY ROOM!!**  IF YOU WILL BE ADMITTED INTO THE HOSPITAL YOU ARE ALLOWED ONLY FOUR SUPPORT PEOPLE DURING VISITATION HOURS ONLY (7 AM -8PM)   The support person(s) must pass our screening, gel in and out, and wear a mask at all times, including in the patient's room. Patients must also wear a mask when staff or their support person are in the room. Visitors GUEST BADGE MUST BE WORN VISIBLY  One adult visitor may remain with you overnight and MUST be in the room by 8 P.M.     Your procedure is scheduled on: 03/05/23   Report to [facility] Main Entrance    Report to admitting at: 7:00 AM   Call this number if you have problems the morning of surgery [phone]   Do not eat food :After Midnight.   After Midnight you may have the following liquids until : 6:00 AM DAY OF SURGERY  Water Black Coffee (sugar ok, NO MILK/CREAM OR CREAMERS)  Tea (sugar ok, NO MILK/CREAM OR CREAMERS) regular and decaf                             Plain Jell-O (NO RED)                                           Fruit ices (not with fruit pulp, NO RED)                                     Popsicles (NO RED)                                                                  Juice: apple, WHITE grape, WHITE cranberry Sports drinks like Gatorade (NO RED)   The day of surgery:  Drink ONE (1) Pre-Surgery Clear Ensure at : 6:00 AM the morning of surgery. Drink in one sitting. Do not sip.  This drink was given to you during your hospital  pre-op appointment visit. Nothing else to drink after completing the  Pre-Surgery Clear Ensure or G2.          If you have questions, please contact your surgeon's office.  Oral Hygiene is also important to reduce your risk of infection.                                     Remember - BRUSH YOUR TEETH THE MORNING OF SURGERY WITH YOUR REGULAR TOOTHPASTE  DENTURES WILL BE REMOVED PRIOR TO SURGERY PLEASE DO NOT APPLY "Poly grip" OR ADHESIVES!!!   Do NOT smoke after Midnight   Take these medicines the morning of surgery with A SIP OF WATER: pregabalin,venlafaxine,levothyroxine.Use inhalers as usual.Tramadol as needed.  DO NOT TAKE  THE FOLLOWING 7 DAYS PRIOR TO SURGERY: Ozempic, Wegovy, Rybelsus (Semaglutide), Byetta (exenatide), Bydureon (exenatide ER), Victoza, Saxenda (liraglutide), or Trulicity (dulaglutide) Mounjaro (Tirzepatide) Adlyxin (Lixisenatide), Polyethylene Glycol Loxenatide.  Bring CPAP mask and tubing day of surgery.                              You may not have any metal on your body including hair pins, jewelry, and body piercing             Do not wear make-up, lotions, powders, perfumes/cologne, or deodorant  Do not wear nail polish including gel and S&S, artificial/acrylic nails, or any other type of covering on natural nails including finger and toenails. If you have artificial nails, gel coating, etc. that needs to be removed by a nail salon please have this removed prior to surgery or surgery may need to be canceled/ delayed if the surgeon/ anesthesia feels like they are unable to be safely monitored.   Do not shave  48 hours prior to surgery.    Do not bring valuables to the hospital. [facility] IS NOT             RESPONSIBLE   FOR VALUABLES.   Contacts, glasses, or bridgework may not be worn into surgery.   Bring small overnight bag day of surgery.   DO NOT BRING YOUR HOME MEDICATIONS TO THE HOSPITAL. PHARMACY WILL DISPENSE MEDICATIONS LISTED ON YOUR MEDICATION LIST TO YOU DURING YOUR ADMISSION IN THE HOSPITAL!    Patients discharged on the day of surgery will not be allowed to drive home.  Someone NEEDS to stay with you for the first 24 hours after anesthesia.   Special Instructions: Bring a copy of your healthcare power of  attorney and living will documents         the day of surgery if you haven't scanned them before.              Please read over the following fact sheets you were given: IF YOU HAVE QUESTIONS ABOUT YOUR PRE-OP INSTRUCTIONS PLEASE CALL [phone]      Pre-operative 5 CHG Bath Instructions   You can play a key role in reducing the risk of infection after surgery. Your skin needs to be as free of germs as possible. You can reduce the number of germs on your skin by washing with CHG (chlorhexidine gluconate) soap before surgery. CHG is an antiseptic soap that kills germs and continues to kill germs even after washing.   DO NOT use if you have an allergy to chlorhexidine/CHG or antibacterial soaps. If your skin becomes reddened or irritated, stop using the CHG and notify one of our RNs at : [phone].   Please shower with the CHG soap starting 4 days before surgery using the following schedule:     Please keep in mind the following:  DO NOT shave, including legs and underarms, starting the day of your first shower.   You may shave your face at any point before/day of surgery.  Place clean sheets on your bed the day you start using CHG soap. Use a clean washcloth (not used since being washed) for each shower. DO NOT sleep with pets once you start using the CHG.   CHG Shower Instructions:  If you choose to wash your hair and private area, wash first with your normal shampoo/soap.  After you use shampoo/soap, rinse your hair and body thoroughly  to remove shampoo/soap residue.  Turn the water OFF and apply about 3 tablespoons (45 ml) of CHG soap to a CLEAN washcloth.  Apply CHG soap ONLY FROM YOUR NECK DOWN TO YOUR TOES (washing for 3-5 minutes)  DO NOT use CHG soap on face, private areas, open wounds, or sores.  Pay special attention to the area where your surgery is being performed.  If you are having back surgery, having someone wash your back for you may be helpful. Wait 2 minutes  after CHG soap is applied, then you may rinse off the CHG soap.  Pat dry with a clean towel  Put on clean clothes/pajamas   If you choose to wear lotion, please use ONLY the CHG-compatible lotions on the back of this paper.     Additional instructions for the day of surgery: DO NOT APPLY any lotions, deodorants, cologne, or perfumes.   Put on clean/comfortable clothes.  Brush your teeth.  Ask your nurse before applying any prescription medications to the skin.      CHG Compatible Lotions   Aveeno Moisturizing lotion  Cetaphil Moisturizing Cream  Cetaphil Moisturizing Lotion  Clairol Herbal Essence Moisturizing Lotion, Dry Skin  Clairol Herbal Essence Moisturizing Lotion, Extra Dry Skin  Clairol Herbal Essence Moisturizing Lotion, Normal Skin  Curel Age Defying Therapeutic Moisturizing Lotion with Alpha Hydroxy  Curel Extreme Care Body Lotion  Curel Soothing Hands Moisturizing Hand Lotion  Curel Therapeutic Moisturizing Cream, Fragrance-Free  Curel Therapeutic Moisturizing Lotion, Fragrance-Free  Curel Therapeutic Moisturizing Lotion, Original Formula  Eucerin Daily Replenishing Lotion  Eucerin Dry Skin Therapy Plus Alpha Hydroxy Crme  Eucerin Dry Skin Therapy Plus Alpha Hydroxy Lotion  Eucerin Original Crme  Eucerin Original Lotion  Eucerin Plus Crme Eucerin Plus Lotion  Eucerin TriLipid Replenishing Lotion  Keri Anti-Bacterial Hand Lotion  Keri Deep Conditioning Original Lotion Dry Skin Formula Softly Scented  Keri Deep Conditioning Original Lotion, Fragrance Free Sensitive Skin Formula  Keri Lotion Fast Absorbing Fragrance Free Sensitive Skin Formula  Keri Lotion Fast Absorbing Softly Scented Dry Skin Formula  Keri Original Lotion  Keri Skin Renewal Lotion Keri Silky Smooth Lotion  Keri Silky Smooth Sensitive Skin Lotion  Nivea Body Creamy Conditioning Oil  Nivea Body Extra Enriched Lotion  Nivea Body Original Lotion  Nivea Body Sheer Moisturizing Lotion Nivea  Crme  Nivea Skin Firming Lotion  NutraDerm 30 Skin Lotion  NutraDerm Skin Lotion  NutraDerm Therapeutic Skin Cream  NutraDerm Therapeutic Skin Lotion  ProShield Protective Hand Cream  Provon moisturizing lotion   Incentive Spirometer  An incentive spirometer is a tool that can help keep your lungs clear and active. This tool measures how well you are filling your lungs with each breath. Taking long deep breaths may help reverse or decrease the chance of developing breathing (pulmonary) problems (especially infection) following: A long period of time when you are unable to move or be active. BEFORE THE PROCEDURE  If the spirometer includes an indicator to show your best effort, your nurse or respiratory therapist will set it to a desired goal. If possible, sit up straight or lean slightly forward. Try not to slouch. Hold the incentive spirometer in an upright position. INSTRUCTIONS FOR USE  Sit on the edge of your bed if possible, or sit up as far as you can in bed or on a chair. Hold the incentive spirometer in an upright position. Breathe out normally. Place the mouthpiece in your mouth and seal your lips tightly around it. Breathe in slowly and  as deeply as possible, raising the piston or the ball toward the top of the column. Hold your breath for 3-5 seconds or for as long as possible. Allow the piston or ball to fall to the bottom of the column. Remove the mouthpiece from your mouth and breathe out normally. Rest for a few seconds and repeat Steps 1 through 7 at least 10 times every 1-2 hours when you are awake. Take your time and take a few normal breaths between deep breaths. The spirometer may include an indicator to show your best effort. Use the indicator as a goal to work toward during each repetition. After each set of 10 deep breaths, practice coughing to be sure your lungs are clear. If you have an incision (the cut made at the time of surgery), support your incision when  coughing by placing a pillow or rolled up towels firmly against it. Once you are able to get out of bed, walk around indoors and cough well. You may stop using the incentive spirometer when instructed by your caregiver.  RISKS AND COMPLICATIONS Take your time so you do not get dizzy or light-headed. If you are in pain, you may need to take or ask for pain medication before doing incentive spirometry. It is harder to take a deep breath if you are having pain. AFTER USE Rest and breathe slowly and easily. It can be helpful to keep track of a log of your progress. Your caregiver can provide you with a simple table to help with this. If you are using the spirometer at home, follow these instructions: SEEK MEDICAL CARE IF:  You are having difficultly using the spirometer. You have trouble using the spirometer as often as instructed. Your pain medication is not giving enough relief while using the spirometer. You develop fever of 100.5 F (38.1 C) or higher. SEEK IMMEDIATE MEDICAL CARE IF:  You cough up bloody sputum that had not been present before. You develop fever of 102 F (38.9 C) or greater. You develop worsening pain at or near the incision site. MAKE SURE YOU:  Understand these instructions. Will watch your condition. Will get help right away if you are not doing well or get worse. Document Released: 12/18/2006 Document Revised: 10/30/2011 Document Reviewed: 02/18/2007 [facility] Patient Information 2014 [place], [place].   ________________________________________________________________________

## 2023-02-23 ENCOUNTER — Other Ambulatory Visit: Payer: Self-pay

## 2023-02-23 ENCOUNTER — Encounter (HOSPITAL_COMMUNITY)
Admission: RE | Admit: 2023-02-23 | Discharge: 2023-02-23 | Disposition: A | Discharge status: Home / Self Care | Payer: Medicare Other | Source: Ambulatory Visit | Attending: Orthopedic Surgery | Admitting: Orthopedic Surgery

## 2023-02-23 ENCOUNTER — Encounter (HOSPITAL_COMMUNITY): Payer: Self-pay

## 2023-02-23 VITALS — BP 100/85 | HR 54 | Temp 97.7°F | Ht 66.0 in | Wt 254.0 lb

## 2023-02-23 DIAGNOSIS — I10 Essential (primary) hypertension: Secondary | ICD-10-CM | POA: Diagnosis not present

## 2023-02-23 DIAGNOSIS — Z01812 Encounter for preprocedural laboratory examination: Secondary | ICD-10-CM | POA: Insufficient documentation

## 2023-02-23 DIAGNOSIS — Z01818 Encounter for other preprocedural examination: Secondary | ICD-10-CM

## 2023-02-23 HISTORY — DX: Fatty (change of) liver, not elsewhere classified: K76.0

## 2023-02-23 HISTORY — DX: Other complications of anesthesia, initial encounter: T88.59XA

## 2023-02-23 LAB — BASIC METABOLIC PANEL
Anion gap: 8 (ref 5–15)
BUN: 25 mg/dL — ABNORMAL HIGH (ref 8–23)
CO2: 29 mmol/L (ref 22–32)
Calcium: 10.3 mg/dL (ref 8.9–10.3)
Chloride: 102 mmol/L (ref 98–111)
Creatinine, Ser: 0.73 mg/dL (ref 0.44–1.00)
GFR, Estimated: >60 mL/min (ref >60)
Glucose, Bld: 99 mg/dL (ref 70–99)
Potassium: 3.8 mmol/L (ref 3.5–5.1)
Sodium: 139 mmol/L (ref 135–145)

## 2023-02-23 LAB — CBC
HCT: 39.7 % (ref 36.0–46.0)
Hemoglobin: 12.8 g/dL (ref 12.0–15.0)
MCH: 29.9 pg (ref 26.0–34.0)
MCHC: 32.2 g/dL (ref 30.0–36.0)
MCV: 92.8 fL (ref 80.0–100.0)
Platelets: 212 10*3/uL (ref 150–400)
RBC: 4.28 MIL/uL (ref 3.87–5.11)
RDW: 12.2 % (ref 11.5–15.5)
WBC: 6.1 10*3/uL (ref 4.0–10.5)
nRBC: 0 % (ref 0.0–0.2)

## 2023-02-23 LAB — SURGICAL PCR SCREEN
MRSA, PCR: NEGATIVE
Staphylococcus aureus: NEGATIVE

## 2023-02-23 NOTE — Progress Notes (Addendum)
For Short Stay: COVID SWAB appointment date:  Bowel Prep reminder:   For Anesthesia: PCP - Dr. Shanda Bumps Copland. LOV: 02/19/23: Clearance: Chart: 12/06/22. Cardiologist - N/A Pulmunologist: Dr. Coralyn Helling. LOV: 11/23/22 Chest x-ray - 11/25/22 EKG - 06/26/22 Stress Test -  ECHO - 12/29/20 Cardiac Cath -  Pacemaker/ICD device last checked: Pacemaker orders received: Device Rep notified:  Spinal Cord Stimulator: N/A  Sleep Study - Yes CPAP - NO  Fasting Blood Sugar - N/A Checks Blood Sugar _____ times a day Date and result of last Hgb A1c-  Last dose of GLP1 agonist- 02/25/23 GLP1 instructions: To hold it after 02/25/23  Last dose of SGLT-2 inhibitors- N/A SGLT-2 instructions:   Blood Thinner Instructions: N/A Aspirin Instructions: Last Dose:  Activity level: Can go up a flight of stairs and activities of daily living without stopping and without chest pain and/or shortness of breath   Able to exercise without chest pain and/or shortness of breath  Anesthesia review: Hx: COPD,HTN,Dysrhythmias.  Patient denies shortness of breath, fever, cough and chest pain at PAT appointment   Patient verbalized understanding of instructions that were given to them at the PAT appointment. Patient was also instructed that they will need to review over the PAT instructions again at home before surgery.

## 2023-02-26 ENCOUNTER — Other Ambulatory Visit: Payer: Self-pay | Admitting: Family Medicine

## 2023-02-26 DIAGNOSIS — F5101 Primary insomnia: Secondary | ICD-10-CM

## 2023-02-27 ENCOUNTER — Ambulatory Visit (INDEPENDENT_AMBULATORY_CARE_PROVIDER_SITE_OTHER): Payer: Self-pay

## 2023-02-27 DIAGNOSIS — I10 Essential (primary) hypertension: Secondary | ICD-10-CM

## 2023-02-27 DIAGNOSIS — I2510 Atherosclerotic heart disease of native coronary artery without angina pectoris: Secondary | ICD-10-CM | POA: Diagnosis not present

## 2023-02-27 DIAGNOSIS — E782 Mixed hyperlipidemia: Secondary | ICD-10-CM

## 2023-03-01 ENCOUNTER — Encounter: Payer: Self-pay | Admitting: Family Medicine

## 2023-03-01 DIAGNOSIS — I719 Aortic aneurysm of unspecified site, without rupture: Secondary | ICD-10-CM | POA: Insufficient documentation

## 2023-03-03 NOTE — Anesthesia Preprocedure Evaluation (Addendum)
Anesthesia Evaluation  Patient identified by MRN, date of birth, ID band Patient awake    Reviewed: Allergy & Precautions, NPO status , Patient's Chart, lab work & pertinent test results  History of Anesthesia Complications (+) POST - OP SPINAL HEADACHE and history of anesthetic complications  Airway Mallampati: I  TM Distance: >3 FB Neck ROM: Full    Dental  (+) Dental Advisory Given, Partial Upper   Pulmonary COPD,  COPD inhaler, former smoker   Pulmonary exam normal        Cardiovascular hypertension, Pt. on medications Normal cardiovascular exam     Neuro/Psych  PSYCHIATRIC DISORDERS Anxiety Depression     Neuromuscular disease    GI/Hepatic Neg liver ROS,GERD  Controlled,,  Endo/Other  Hypothyroidism  Morbid obesity  Renal/GU negative Renal ROS     Musculoskeletal  (+) Arthritis ,  Fibromyalgia -  Abdominal  (+) + obese  Peds  Hematology negative hematology ROS (+)   Anesthesia Other Findings Chronic back pain    Reproductive/Obstetrics                             Anesthesia Physical Anesthesia Plan  ASA: 3  Anesthesia Plan: Spinal   Post-op Pain Management: Regional block* and Tylenol PO (pre-op)*   Induction:   PONV Risk Score and Plan: 2 and Treatment may vary due to age or medical condition, Propofol infusion and Ondansetron  Airway Management Planned: Natural Airway and Simple Face Mask  Additional Equipment: None  Intra-op Plan:   Post-operative Plan:   Informed Consent: I have reviewed the patients History and Physical, chart, labs and discussed the procedure including the risks, benefits and alternatives for the proposed anesthesia with the patient or authorized representative who has indicated his/her understanding and acceptance.       Plan Discussed with: CRNA and Anesthesiologist  Anesthesia Plan Comments: (Labs reviewed, platelets acceptable.  Discussed risks and benefits of spinal, including spinal/epidural hematoma, infection, failed block, and PDPH. Patient expressed understanding and wished to proceed. )       Anesthesia Quick Evaluation

## 2023-03-05 ENCOUNTER — Other Ambulatory Visit: Payer: Self-pay

## 2023-03-05 ENCOUNTER — Ambulatory Visit (HOSPITAL_COMMUNITY): Payer: Medicare Other | Admitting: Physician Assistant

## 2023-03-05 ENCOUNTER — Encounter (HOSPITAL_COMMUNITY)
Admission: RE | Disposition: A | Discharge status: Home / Self Care | Payer: Self-pay | Source: Home / Self Care | Attending: Orthopedic Surgery

## 2023-03-05 ENCOUNTER — Observation Stay (HOSPITAL_COMMUNITY)
Admission: RE | Admit: 2023-03-05 | Discharge: 2023-03-06 | Disposition: A | Discharge status: Home / Self Care | Payer: Medicare Other | Attending: Orthopedic Surgery | Admitting: Orthopedic Surgery

## 2023-03-05 ENCOUNTER — Ambulatory Visit (HOSPITAL_COMMUNITY): Payer: Medicare Other | Admitting: Anesthesiology

## 2023-03-05 ENCOUNTER — Encounter (HOSPITAL_COMMUNITY): Payer: Self-pay | Admitting: Orthopedic Surgery

## 2023-03-05 DIAGNOSIS — E039 Hypothyroidism, unspecified: Secondary | ICD-10-CM | POA: Diagnosis not present

## 2023-03-05 DIAGNOSIS — I10 Essential (primary) hypertension: Secondary | ICD-10-CM

## 2023-03-05 DIAGNOSIS — M179 Osteoarthritis of knee, unspecified: Principal | ICD-10-CM | POA: Diagnosis present

## 2023-03-05 DIAGNOSIS — G8918 Other acute postprocedural pain: Secondary | ICD-10-CM | POA: Diagnosis not present

## 2023-03-05 DIAGNOSIS — Z8542 Personal history of malignant neoplasm of other parts of uterus: Secondary | ICD-10-CM | POA: Insufficient documentation

## 2023-03-05 DIAGNOSIS — J449 Chronic obstructive pulmonary disease, unspecified: Secondary | ICD-10-CM

## 2023-03-05 DIAGNOSIS — Z87891 Personal history of nicotine dependence: Secondary | ICD-10-CM | POA: Insufficient documentation

## 2023-03-05 DIAGNOSIS — Z853 Personal history of malignant neoplasm of breast: Secondary | ICD-10-CM | POA: Diagnosis not present

## 2023-03-05 DIAGNOSIS — M1711 Unilateral primary osteoarthritis, right knee: Principal | ICD-10-CM | POA: Diagnosis present

## 2023-03-05 DIAGNOSIS — M1712 Unilateral primary osteoarthritis, left knee: Secondary | ICD-10-CM | POA: Diagnosis not present

## 2023-03-05 DIAGNOSIS — E785 Hyperlipidemia, unspecified: Secondary | ICD-10-CM | POA: Diagnosis not present

## 2023-03-05 HISTORY — PX: TOTAL KNEE ARTHROPLASTY: SHX125

## 2023-03-05 SURGERY — ARTHROPLASTY, KNEE, TOTAL
Anesthesia: Spinal | Site: Knee | Laterality: Right

## 2023-03-05 MED ORDER — STERILE WATER FOR IRRIGATION IR SOLN
Status: DC | PRN
  Administered 2023-03-05: 2000 mL

## 2023-03-05 MED ORDER — TIZANIDINE HCL 4 MG PO TABS: 4.0000 mg | ORAL_TABLET | Freq: Four times a day (QID) | ORAL | Status: DC | PRN

## 2023-03-05 MED ORDER — SODIUM CHLORIDE (PF) 0.9 % IJ SOLN
INTRAMUSCULAR | Status: AC
  Filled 2023-03-05: qty 50

## 2023-03-05 MED ORDER — DEXAMETHASONE SODIUM PHOSPHATE 10 MG/ML IJ SOLN
INTRAMUSCULAR | Status: AC
  Filled 2023-03-05: qty 1

## 2023-03-05 MED ORDER — MENTHOL 3 MG MT LOZG: 1.0000 | LOZENGE | OROMUCOSAL | Status: DC | PRN

## 2023-03-05 MED ORDER — DEXAMETHASONE SODIUM PHOSPHATE 10 MG/ML IJ SOLN
10.0000 mg | Freq: Once | INTRAMUSCULAR | Status: AC
  Administered 2023-03-06: 10 mg via INTRAVENOUS
  Filled 2023-03-05: qty 1

## 2023-03-05 MED ORDER — DIPHENHYDRAMINE HCL 12.5 MG/5ML PO ELIX: 12.5000 mg | ORAL_SOLUTION | ORAL | Status: DC | PRN

## 2023-03-05 MED ORDER — POLYETHYLENE GLYCOL 3350 17 G PO PACK: 17.0000 g | PACK | Freq: Every day | ORAL | Status: DC | PRN

## 2023-03-05 MED ORDER — HYDROMORPHONE HCL 2 MG PO TABS
1.0000 mg | ORAL_TABLET | ORAL | Status: DC | PRN
  Administered 2023-03-06: 2 mg via ORAL
  Filled 2023-03-05: qty 1

## 2023-03-05 MED ORDER — DEXAMETHASONE SODIUM PHOSPHATE 10 MG/ML IJ SOLN
8.0000 mg | Freq: Once | INTRAMUSCULAR | Status: AC
  Administered 2023-03-05: 8 mg via INTRAVENOUS

## 2023-03-05 MED ORDER — PHENOL 1.4 % MT LIQD: 1.0000 | OROMUCOSAL | Status: DC | PRN

## 2023-03-05 MED ORDER — ONDANSETRON HCL 4 MG PO TABS: 4.0000 mg | ORAL_TABLET | Freq: Four times a day (QID) | ORAL | Status: DC | PRN

## 2023-03-05 MED ORDER — ACETAMINOPHEN 500 MG PO TABS
1000.0000 mg | ORAL_TABLET | Freq: Four times a day (QID) | ORAL | Status: AC
  Administered 2023-03-05 – 2023-03-06 (×4): 1000 mg via ORAL
  Filled 2023-03-05 (×4): qty 2

## 2023-03-05 MED ORDER — TRAMADOL HCL 50 MG PO TABS
50.0000 mg | ORAL_TABLET | Freq: Four times a day (QID) | ORAL | Status: DC | PRN
  Administered 2023-03-05 – 2023-03-06 (×3): 100 mg via ORAL
  Filled 2023-03-05 (×3): qty 2

## 2023-03-05 MED ORDER — LACTATED RINGERS IV SOLN: INTRAVENOUS | Status: DC

## 2023-03-05 MED ORDER — POVIDONE-IODINE 10 % EX SWAB
2.0000 | Freq: Once | CUTANEOUS | Status: AC
  Administered 2023-03-05: 2 via TOPICAL

## 2023-03-05 MED ORDER — PROPOFOL 500 MG/50ML IV EMUL
INTRAVENOUS | Status: DC | PRN
  Administered 2023-03-05: 100 ug/kg/min via INTRAVENOUS

## 2023-03-05 MED ORDER — ONDANSETRON HCL 4 MG/2ML IJ SOLN: 4.0000 mg | Freq: Once | INTRAMUSCULAR | Status: DC | PRN

## 2023-03-05 MED ORDER — BISACODYL 10 MG RE SUPP: 10.0000 mg | Freq: Every day | RECTAL | Status: DC | PRN

## 2023-03-05 MED ORDER — TRANEXAMIC ACID-NACL 1000-0.7 MG/100ML-% IV SOLN
1000.0000 mg | INTRAVENOUS | Status: AC
  Administered 2023-03-05: 1000 mg via INTRAVENOUS
  Filled 2023-03-05: qty 100

## 2023-03-05 MED ORDER — RIVAROXABAN 10 MG PO TABS
10.0000 mg | ORAL_TABLET | Freq: Every day | ORAL | Status: DC
  Administered 2023-03-06: 10 mg via ORAL
  Filled 2023-03-05: qty 1

## 2023-03-05 MED ORDER — OXYCODONE HCL 5 MG/5ML PO SOLN: 5.0000 mg | Freq: Once | ORAL | Status: AC | PRN

## 2023-03-05 MED ORDER — LIDOCAINE HCL (PF) 2 % IJ SOLN
INTRAMUSCULAR | Status: DC | PRN
  Administered 2023-03-05: 60 mg via INTRADERMAL

## 2023-03-05 MED ORDER — MOMETASONE FURO-FORMOTEROL FUM 100-5 MCG/ACT IN AERO
2.0000 | INHALATION_SPRAY | Freq: Two times a day (BID) | RESPIRATORY_TRACT | Status: DC
  Administered 2023-03-05: 2 via RESPIRATORY_TRACT
  Administered 2023-03-06: 1 via RESPIRATORY_TRACT
  Filled 2023-03-05: qty 8.8

## 2023-03-05 MED ORDER — VENLAFAXINE HCL 50 MG PO TABS
50.0000 mg | ORAL_TABLET | Freq: Two times a day (BID) | ORAL | Status: DC
  Administered 2023-03-05 – 2023-03-06 (×2): 50 mg via ORAL
  Filled 2023-03-05 (×4): qty 1

## 2023-03-05 MED ORDER — FENTANYL CITRATE PF 50 MCG/ML IJ SOSY
50.0000 ug | PREFILLED_SYRINGE | INTRAMUSCULAR | Status: DC
  Administered 2023-03-05: 50 ug via INTRAVENOUS
  Filled 2023-03-05: qty 2

## 2023-03-05 MED ORDER — EPHEDRINE 5 MG/ML INJ
INTRAVENOUS | Status: AC
  Filled 2023-03-05: qty 5

## 2023-03-05 MED ORDER — CEFAZOLIN SODIUM-DEXTROSE 2-4 GM/100ML-% IV SOLN
2.0000 g | INTRAVENOUS | Status: AC
  Administered 2023-03-05: 2 g via INTRAVENOUS
  Filled 2023-03-05: qty 100

## 2023-03-05 MED ORDER — FUROSEMIDE 40 MG PO TABS
80.0000 mg | ORAL_TABLET | Freq: Every day | ORAL | Status: DC
  Administered 2023-03-06: 80 mg via ORAL
  Filled 2023-03-05: qty 2

## 2023-03-05 MED ORDER — PROPOFOL 10 MG/ML IV BOLUS
INTRAVENOUS | Status: DC | PRN
Start: 2023-03-05 — End: 2023-03-05
  Administered 2023-03-05: 20 mg via INTRAVENOUS

## 2023-03-05 MED ORDER — TRAZODONE HCL 50 MG PO TABS: 25.0000 mg | ORAL_TABLET | Freq: Every evening | ORAL | Status: DC | PRN

## 2023-03-05 MED ORDER — HYDROMORPHONE HCL 1 MG/ML IJ SOLN: 0.5000 mg | INTRAMUSCULAR | Status: DC | PRN

## 2023-03-05 MED ORDER — ACETAMINOPHEN 500 MG PO TABS
1000.0000 mg | ORAL_TABLET | Freq: Once | ORAL | Status: DC
  Filled 2023-03-05: qty 2

## 2023-03-05 MED ORDER — PROPOFOL 500 MG/50ML IV EMUL
INTRAVENOUS | Status: AC
  Filled 2023-03-05: qty 50

## 2023-03-05 MED ORDER — ROPIVACAINE HCL 7.5 MG/ML IJ SOLN
INTRAMUSCULAR | Status: DC | PRN
  Administered 2023-03-05: 20 mL via PERINEURAL

## 2023-03-05 MED ORDER — POLYVINYL ALCOHOL 1.4 % OP SOLN
1.0000 [drp] | Freq: Every day | OPHTHALMIC | Status: DC
  Administered 2023-03-05: 1 [drp] via OPHTHALMIC
  Filled 2023-03-05: qty 15

## 2023-03-05 MED ORDER — METOCLOPRAMIDE HCL 5 MG/ML IJ SOLN: 5.0000 mg | Freq: Three times a day (TID) | INTRAMUSCULAR | Status: DC | PRN

## 2023-03-05 MED ORDER — FLEET ENEMA 7-19 GM/118ML RE ENEM: 1.0000 | ENEMA | Freq: Once | RECTAL | Status: DC | PRN

## 2023-03-05 MED ORDER — FENTANYL CITRATE PF 50 MCG/ML IJ SOSY: 25.0000 ug | PREFILLED_SYRINGE | INTRAMUSCULAR | Status: DC | PRN

## 2023-03-05 MED ORDER — SODIUM CHLORIDE 0.9 % IV SOLN: INTRAVENOUS | Status: DC

## 2023-03-05 MED ORDER — BUPIVACAINE LIPOSOME 1.3 % IJ SUSP
INTRAMUSCULAR | Status: DC | PRN
  Administered 2023-03-05: 20 mL

## 2023-03-05 MED ORDER — CEFAZOLIN SODIUM-DEXTROSE 2-4 GM/100ML-% IV SOLN
2.0000 g | Freq: Four times a day (QID) | INTRAVENOUS | Status: AC
  Administered 2023-03-05 (×2): 2 g via INTRAVENOUS
  Filled 2023-03-05 (×2): qty 100

## 2023-03-05 MED ORDER — SODIUM CHLORIDE 0.9 % IR SOLN
Status: DC | PRN
  Administered 2023-03-05: 1000 mL

## 2023-03-05 MED ORDER — OXYCODONE HCL 5 MG PO TABS
ORAL_TABLET | ORAL | Status: AC
  Filled 2023-03-05: qty 1

## 2023-03-05 MED ORDER — OXYCODONE HCL 5 MG PO TABS
5.0000 mg | ORAL_TABLET | Freq: Once | ORAL | Status: AC | PRN
  Administered 2023-03-05: 5 mg via ORAL

## 2023-03-05 MED ORDER — EPHEDRINE SULFATE-NACL 50-0.9 MG/10ML-% IV SOSY
PREFILLED_SYRINGE | INTRAVENOUS | Status: DC | PRN
  Administered 2023-03-05 (×2): 10 mg via INTRAVENOUS
  Administered 2023-03-05: 5 mg via INTRAVENOUS
  Administered 2023-03-05: 10 mg via INTRAVENOUS

## 2023-03-05 MED ORDER — ONDANSETRON HCL 4 MG/2ML IJ SOLN
INTRAMUSCULAR | Status: AC
  Filled 2023-03-05: qty 2

## 2023-03-05 MED ORDER — ONDANSETRON HCL 4 MG/2ML IJ SOLN
INTRAMUSCULAR | Status: DC | PRN
  Administered 2023-03-05: 4 mg via INTRAVENOUS

## 2023-03-05 MED ORDER — LEVOTHYROXINE SODIUM 100 MCG PO TABS
200.0000 ug | ORAL_TABLET | Freq: Every day | ORAL | Status: DC
  Administered 2023-03-06: 200 ug via ORAL
  Filled 2023-03-05: qty 2

## 2023-03-05 MED ORDER — CHLORHEXIDINE GLUCONATE 0.12 % MT SOLN
15.0000 mL | Freq: Once | OROMUCOSAL | Status: AC
  Administered 2023-03-05: 15 mL via OROMUCOSAL

## 2023-03-05 MED ORDER — BUPIVACAINE LIPOSOME 1.3 % IJ SUSP
INTRAMUSCULAR | Status: AC
  Filled 2023-03-05: qty 20

## 2023-03-05 MED ORDER — ONDANSETRON HCL 4 MG/2ML IJ SOLN: 4.0000 mg | Freq: Four times a day (QID) | INTRAMUSCULAR | Status: DC | PRN

## 2023-03-05 MED ORDER — SODIUM CHLORIDE (PF) 0.9 % IJ SOLN
INTRAMUSCULAR | Status: AC
  Filled 2023-03-05: qty 10

## 2023-03-05 MED ORDER — ACETAMINOPHEN 10 MG/ML IV SOLN
1000.0000 mg | Freq: Four times a day (QID) | INTRAVENOUS | Status: DC
  Administered 2023-03-05: 1000 mg via INTRAVENOUS
  Filled 2023-03-05: qty 100

## 2023-03-05 MED ORDER — ATORVASTATIN CALCIUM 20 MG PO TABS
20.0000 mg | ORAL_TABLET | Freq: Every day | ORAL | Status: DC
  Administered 2023-03-06: 20 mg via ORAL
  Filled 2023-03-05: qty 1

## 2023-03-05 MED ORDER — PREGABALIN 100 MG PO CAPS
100.0000 mg | ORAL_CAPSULE | Freq: Three times a day (TID) | ORAL | Status: DC
  Administered 2023-03-05 – 2023-03-06 (×3): 100 mg via ORAL
  Filled 2023-03-05 (×3): qty 1

## 2023-03-05 MED ORDER — ORAL CARE MOUTH RINSE: 15.0000 mL | Freq: Once | OROMUCOSAL | Status: AC

## 2023-03-05 MED ORDER — AMISULPRIDE (ANTIEMETIC) 5 MG/2ML IV SOLN
10.0000 mg | Freq: Once | INTRAVENOUS | Status: AC
  Administered 2023-03-05: 10 mg via INTRAVENOUS

## 2023-03-05 MED ORDER — BUPIVACAINE IN DEXTROSE 0.75-8.25 % IT SOLN
INTRATHECAL | Status: DC | PRN
  Administered 2023-03-05: 1.6 mL via INTRATHECAL

## 2023-03-05 MED ORDER — AMISULPRIDE (ANTIEMETIC) 5 MG/2ML IV SOLN
INTRAVENOUS | Status: AC
  Filled 2023-03-05: qty 4

## 2023-03-05 MED ORDER — 0.9 % SODIUM CHLORIDE (POUR BTL) OPTIME
TOPICAL | Status: DC | PRN
  Administered 2023-03-05: 1000 mL

## 2023-03-05 MED ORDER — PHENYLEPHRINE HCL-NACL 20-0.9 MG/250ML-% IV SOLN
INTRAVENOUS | Status: DC | PRN
  Administered 2023-03-05: 35 ug/min via INTRAVENOUS

## 2023-03-05 MED ORDER — PROPOFOL 1000 MG/100ML IV EMUL
INTRAVENOUS | Status: AC
  Filled 2023-03-05: qty 100

## 2023-03-05 MED ORDER — BUPIVACAINE LIPOSOME 1.3 % IJ SUSP: 20.0000 mL | Freq: Once | INTRAMUSCULAR | Status: DC

## 2023-03-05 MED ORDER — DOCUSATE SODIUM 100 MG PO CAPS
100.0000 mg | ORAL_CAPSULE | Freq: Two times a day (BID) | ORAL | Status: DC
  Administered 2023-03-05 – 2023-03-06 (×3): 100 mg via ORAL
  Filled 2023-03-05 (×3): qty 1

## 2023-03-05 MED ORDER — LOSARTAN POTASSIUM 50 MG PO TABS
50.0000 mg | ORAL_TABLET | Freq: Two times a day (BID) | ORAL | Status: DC
  Administered 2023-03-06: 50 mg via ORAL
  Filled 2023-03-05: qty 1

## 2023-03-05 MED ORDER — METOCLOPRAMIDE HCL 5 MG PO TABS: 5.0000 mg | ORAL_TABLET | Freq: Three times a day (TID) | ORAL | Status: DC | PRN

## 2023-03-05 SURGICAL SUPPLY — 64 items
ADH SKN CLS APL DERMABOND .7 (GAUZE/BANDAGES/DRESSINGS) ×1
ADH SKN CLS LQ APL DERMABOND (GAUZE/BANDAGES/DRESSINGS) ×1
ATTUNE PS FEM RT SZ 6 CEM KNEE (Femur) IMPLANT
ATTUNE PSRP INSR SZ6 7 KNEE (Insert) IMPLANT
BAG COUNTER SPONGE SURGICOUNT (BAG) IMPLANT
BAG SPEC THK2 15X12 ZIP CLS (MISCELLANEOUS) ×1
BAG SPNG CNTER NS LX DISP (BAG)
BAG ZIPLOCK 12X15 (MISCELLANEOUS) ×2 IMPLANT
BASE TIBIAL ROT PLAT SZ 5 KNEE (Knees) IMPLANT
BLADE SAG 18X100X1.27 (BLADE) ×2 IMPLANT
BLADE SAW SGTL 11.0X1.19X90.0M (BLADE) ×2 IMPLANT
BNDG CMPR 5X62 HK CLSR LF (GAUZE/BANDAGES/DRESSINGS) ×1
BNDG CMPR 6 X 5 YARDS HK CLSR (GAUZE/BANDAGES/DRESSINGS) ×1
BNDG CMPR 6"X 5 YARDS HK CLSR (GAUZE/BANDAGES/DRESSINGS) ×1
BNDG CMPR MED 10X6 ELC LF (GAUZE/BANDAGES/DRESSINGS) ×1
BNDG ELASTIC 6INX 5YD STR LF (GAUZE/BANDAGES/DRESSINGS) ×2 IMPLANT
BNDG ELASTIC 6X10 VLCR STRL LF (GAUZE/BANDAGES/DRESSINGS) IMPLANT
BOWL SMART MIX CTS (DISPOSABLE) ×2 IMPLANT
BSPLAT TIB 5 CMNT ROT PLAT STR (Knees) ×1 IMPLANT
CEMENT HV SMART SET (Cement) ×4 IMPLANT
COVER SURGICAL LIGHT HANDLE (MISCELLANEOUS) ×2 IMPLANT
CUFF TOURN SGL QUICK 34 (TOURNIQUET CUFF) ×1
CUFF TRNQT CYL 34X4.125X (TOURNIQUET CUFF) ×2 IMPLANT
DERMABOND ADVANCED .7 DNX12 (GAUZE/BANDAGES/DRESSINGS) ×2 IMPLANT
DERMABOND ADVANCED .7 DNX6 (GAUZE/BANDAGES/DRESSINGS) IMPLANT
DRAPE INCISE IOBAN 66X45 STRL (DRAPES) ×2 IMPLANT
DRAPE U-SHAPE 47X51 STRL (DRAPES) ×2 IMPLANT
DRESSING AQUACEL AG SP 3.5X10 (GAUZE/BANDAGES/DRESSINGS) IMPLANT
DRSG AQUACEL AG ADV 3.5X10 (GAUZE/BANDAGES/DRESSINGS) ×2 IMPLANT
DRSG AQUACEL AG SP 3.5X10 (GAUZE/BANDAGES/DRESSINGS) ×1
DURAPREP 26ML APPLICATOR (WOUND CARE) ×2 IMPLANT
ELECT REM PT RETURN 15FT ADLT (MISCELLANEOUS) ×2 IMPLANT
GLOVE BIO SURGEON STRL SZ 6.5 (GLOVE) IMPLANT
GLOVE BIO SURGEON STRL SZ8 (GLOVE) ×2 IMPLANT
GLOVE BIOGEL PI IND STRL 6.5 (GLOVE) IMPLANT
GLOVE BIOGEL PI IND STRL 7.0 (GLOVE) IMPLANT
GLOVE BIOGEL PI IND STRL 8 (GLOVE) ×2 IMPLANT
GOWN STRL REUS W/ TWL LRG LVL3 (GOWN DISPOSABLE) ×2 IMPLANT
GOWN STRL REUS W/TWL LRG LVL3 (GOWN DISPOSABLE) ×1
HANDPIECE INTERPULSE COAX TIP (DISPOSABLE) ×1
HOLDER FOLEY CATH W/STRAP (MISCELLANEOUS) IMPLANT
IMMOBILIZER KNEE 20 (SOFTGOODS) ×1
IMMOBILIZER KNEE 20 THIGH 36 (SOFTGOODS) ×2 IMPLANT
IMMOBILIZER KNEE 22 UNIV (SOFTGOODS) IMPLANT
KIT TURNOVER KIT A (KITS) IMPLANT
MANIFOLD NEPTUNE II (INSTRUMENTS) ×2 IMPLANT
NS IRRIG 1000ML POUR BTL (IV SOLUTION) ×2 IMPLANT
PACK TOTAL KNEE CUSTOM (KITS) ×2 IMPLANT
PADDING CAST COTTON 6X4 STRL (CAST SUPPLIES) ×4 IMPLANT
PATELLA MEDIAL ATTUN 35MM KNEE (Knees) IMPLANT
PIN STEINMAN FIXATION KNEE (PIN) IMPLANT
PROTECTOR NERVE ULNAR (MISCELLANEOUS) ×2 IMPLANT
SET HNDPC FAN SPRY TIP SCT (DISPOSABLE) ×2 IMPLANT
SPIKE FLUID TRANSFER (MISCELLANEOUS) ×2 IMPLANT
SUT MNCRL AB 4-0 PS2 18 (SUTURE) ×2 IMPLANT
SUT STRATAFIX 0 PDS 27 VIOLET (SUTURE) ×1
SUT VIC AB 2-0 CT1 27 (SUTURE) ×3
SUT VIC AB 2-0 CT1 TAPERPNT 27 (SUTURE) ×6 IMPLANT
SUTURE STRATFX 0 PDS 27 VIOLET (SUTURE) ×2 IMPLANT
TIBIAL BASE ROT PLAT SZ 5 KNEE (Knees) ×1 IMPLANT
TRAY FOLEY MTR SLVR 16FR STAT (SET/KITS/TRAYS/PACK) ×2 IMPLANT
TUBE SUCTION HIGH CAP CLEAR NV (SUCTIONS) ×2 IMPLANT
WATER STERILE IRR 1000ML POUR (IV SOLUTION) ×4 IMPLANT
WRAP KNEE MAXI GEL POST OP (GAUZE/BANDAGES/DRESSINGS) ×2 IMPLANT

## 2023-03-05 NOTE — Anesthesia Procedure Notes (Signed)
Anesthesia Regional Block: Adductor canal block   Pre-Anesthetic Checklist: , timeout performed,  Correct Patient, Correct Site, Correct Laterality,  Correct Procedure, Correct Position, site marked,  Risks and benefits discussed,  Surgical consent,  Pre-op evaluation,  At surgeon's request and post-op pain management  Laterality: Right  Prep: chloraprep       Needles:  Injection technique: Single-shot  Needle Type: Echogenic Needle     Needle Length: 10cm  Needle Gauge: 21     Additional Needles:   Narrative:  Start time: 03/05/2023 8:53 AM End time: 03/05/2023 8:56 AM Injection made incrementally with aspirations every 5 mL.  Performed by: Personally  Anesthesiologist: Beryle Lathe, MD  Additional Notes: No pain on injection. No increased resistance to injection. Injection made in 5cc increments. Good needle visualization. Patient tolerated the procedure well.

## 2023-03-05 NOTE — Discharge Instructions (Addendum)
Shelly Gross, MD Total Joint Specialist [facility] [place] [address], [address] [place], [place] [address] [phone]  TOTAL KNEE REPLACEMENT POSTOPERATIVE DIRECTIONS    Knee Rehabilitation, Guidelines Following Surgery  Results after knee surgery are often greatly improved when you follow the exercise, range of motion and muscle strengthening exercises prescribed by your doctor. Safety measures are also important to protect the knee from further injury. If any of these exercises cause you to have increased pain or swelling in your knee joint, decrease the amount until you are comfortable again and slowly increase them. If you have problems or questions, call your caregiver or physical therapist for advice.   BLOOD CLOT PREVENTION Take a 10 mg Xarelto once a day for three weeks following surgery. Then take an 81 mg Aspirin once a day for three weeks. Then discontinue Aspirin. You may resume your vitamins/supplements once you have discontinued the Xarelto. Do not take any NSAIDs (Advil, Aleve, Ibuprofen, Meloxicam, etc.) until you have discontinued the Xarelto.   HOME CARE INSTRUCTIONS  Remove items at home which could result in a fall. This includes throw rugs or furniture in walking pathways.  ICE to the affected knee as much as tolerated. Icing helps control swelling. If the swelling is well controlled you will be more comfortable and rehab easier. Continue to use ice on the knee for pain and swelling from surgery. You may notice swelling that will progress down to the foot and ankle. This is normal after surgery. Elevate the leg when you are not up walking on it.    Continue to use the breathing machine which will help keep your temperature down. It is common for your temperature to cycle up and down following surgery, especially at night when you are not up moving around and exerting yourself. The breathing machine keeps your lungs expanded and your temperature  down. Do not place pillow under the operative knee, focus on keeping the knee straight while resting  DIET You may resume your previous home diet once you are discharged from the hospital.  DRESSING / WOUND CARE / SHOWERING Keep your bulky bandage on for 2 days. On the third post-operative day you may remove the Ace bandage and gauze. There is a waterproof adhesive bandage on your skin which will stay in place until your first follow-up appointment. Once you remove this you will not need to place another bandage You may begin showering 3 days following surgery, but do not submerge the incision under water.  ACTIVITY For the first 5 days, the key is rest and control of pain and swelling Do your home exercises twice a day starting on post-operative day 3. On the days you go to physical therapy, just do the home exercises once that day. You should rest, ice and elevate the leg for 50 minutes out of every hour. Get up and walk/stretch for 10 minutes per hour. After 5 days you can increase your activity slowly as tolerated. Walk with your walker as instructed. Use the walker until you are comfortable transitioning to a cane. Walk with the cane in the opposite hand of the operative leg. You may discontinue the cane once you are comfortable and walking steadily. Avoid periods of inactivity such as sitting longer than an hour when not asleep. This helps prevent blood clots.  You may discontinue the knee immobilizer once you are able to perform a straight leg raise while lying down. You may resume a sexual relationship in one month or  when given the OK by your doctor.  You may return to work once you are cleared by your doctor.  Do not drive a car for 6 weeks or until released by your surgeon.  Do not drive while taking narcotics.  TED HOSE STOCKINGS Wear the elastic stockings on both legs for three weeks following surgery during the day. You may remove them at night for sleeping.  WEIGHT  BEARING Weight bearing as tolerated with assist device (walker, cane, etc) as directed, use it as long as suggested by your surgeon or therapist, typically at least 4-6 weeks.  POSTOPERATIVE CONSTIPATION PROTOCOL Constipation - defined medically as fewer than three stools per week and severe constipation as less than one stool per week.  One of the most common issues patients have following surgery is constipation.  Even if you have a regular bowel pattern at home, your normal regimen is likely to be disrupted due to multiple reasons following surgery.  Combination of anesthesia, postoperative narcotics, change in appetite and fluid intake all can affect your bowels.  In order to avoid complications following surgery, here are some recommendations in order to help you during your recovery period.  Colace (docusate) - Pick up an over-the-counter form of Colace or another stool softener and take twice a day as long as you are requiring postoperative pain medications.  Take with a full glass of water daily.  If you experience loose stools or diarrhea, hold the colace until you stool forms back up. If your symptoms do not get better within 1 week or if they get worse, check with your doctor. Dulcolax (bisacodyl) - Pick up over-the-counter and take as directed by the product packaging as needed to assist with the movement of your bowels.  Take with a full glass of water.  Use this product as needed if not relieved by Colace only.  MiraLax (polyethylene glycol) - Pick up over-the-counter to have on hand. MiraLax is a solution that will increase the amount of water in your bowels to assist with bowel movements.  Take as directed and can mix with a glass of water, juice, soda, coffee, or tea. Take if you go more than two days without a movement. Do not use MiraLax more than once per day. Call your doctor if you are still constipated or irregular after using this medication for 7 days in a row.  If you continue  to have problems with postoperative constipation, please contact the office for further assistance and recommendations.  If you experience "the worst abdominal pain ever" or develop nausea or vomiting, please contact the office immediatly for further recommendations for treatment.  ITCHING If you experience itching with your medications, try taking only a single pain pill, or even half a pain pill at a time.  You can also use Benadryl over the counter for itching or also to help with sleep.   MEDICATIONS See your medication summary on the "After Visit Summary" that the nursing staff will review with you prior to discharge.  You may have some home medications which will be placed on hold until you complete the course of blood thinner medication.  It is important for you to complete the blood thinner medication as prescribed by your surgeon.  Continue your approved medications as instructed at time of discharge.  PRECAUTIONS If you experience chest pain or shortness of breath - call 911 immediately for transfer to the hospital emergency department.  If you develop a fever greater that 101 F, purulent  drainage from wound, increased redness or drainage from wound, foul odor from the wound/dressing, or calf pain - CONTACT YOUR SURGEON.                                                   FOLLOW-UP APPOINTMENTS Make sure you keep all of your appointments after your operation with your surgeon and caregivers. You should call the office at the above phone number and make an appointment for approximately two weeks after the date of your surgery or on the date instructed by your surgeon outlined in the "After Visit Summary".  RANGE OF MOTION AND STRENGTHENING EXERCISES  Rehabilitation of the knee is important following a knee injury or an operation. After just a few days of immobilization, the muscles of the thigh which control the knee become weakened and shrink (atrophy). Knee exercises are designed to build up  the tone and strength of the thigh muscles and to improve knee motion. Often times heat used for twenty to thirty minutes before working out will loosen up your tissues and help with improving the range of motion but do not use heat for the first two weeks following surgery. These exercises can be done on a training (exercise) mat, on the floor, on a table or on a bed. Use what ever works the best and is most comfortable for you Knee exercises include:  Leg Lifts - While your knee is still immobilized in a splint or cast, you can do straight leg raises. Lift the leg to 60 degrees, hold for 3 sec, and slowly lower the leg. Repeat 10-20 times 2-3 times daily. Perform this exercise against resistance later as your knee gets better.  Quad and Hamstring Sets - Tighten up the muscle on the front of the thigh (Quad) and hold for 5-10 sec. Repeat this 10-20 times hourly. Hamstring sets are done by pushing the foot backward against an object and holding for 5-10 sec. Repeat as with quad sets.  Leg Slides: Lying on your back, slowly slide your foot toward your buttocks, bending your knee up off the floor (only go as far as is comfortable). Then slowly slide your foot back down until your leg is flat on the floor again. Angel Wings: Lying on your back spread your legs to the side as far apart as you can without causing discomfort.  A rehabilitation program following serious knee injuries can speed recovery and prevent re-injury in the future due to weakened muscles. Contact your doctor or a physical therapist for more information on knee rehabilitation.   POST-OPERATIVE OPIOID TAPER INSTRUCTIONS: It is important to wean off of your opioid medication as soon as possible. If you do not need pain medication after your surgery it is ok to stop day one. Opioids include: Codeine, Hydrocodone(Norco, Vicodin), Oxycodone(Percocet, oxycontin) and hydromorphone amongst others.  Long term and even short term use of opiods can  cause: Increased pain response Dependence Constipation Depression Respiratory depression And more.  Withdrawal symptoms can include Flu like symptoms Nausea, vomiting And more Techniques to manage these symptoms Hydrate well Eat regular healthy meals Stay active Use relaxation techniques(deep breathing, meditating, yoga) Do Not substitute Alcohol to help with tapering If you have been on opioids for less than two weeks and do not have pain than it is ok to stop all together.  Plan to  wean off of opioids This plan should start within one week post op of your joint replacement. Maintain the same interval or time between taking each dose and first decrease the dose.  Cut the total daily intake of opioids by one tablet each day Next start to increase the time between doses. The last dose that should be eliminated is the evening dose.   IF YOU ARE TRANSFERRED TO A SKILLED REHAB FACILITY If the patient is transferred to a skilled rehab facility following release from the hospital, a list of the current medications will be sent to the facility for the patient to continue.  When discharged from the skilled rehab facility, please have the facility set up the patient's Home Health Physical Therapy prior to being released. Also, the skilled facility will be responsible for providing the patient with their medications at time of release from the facility to include their pain medication, the muscle relaxants, and their blood thinner medication. If the patient is still at the rehab facility at time of the two week follow up appointment, the skilled rehab facility will also need to assist the patient in arranging follow up appointment in our office and any transportation needs.  MAKE SURE YOU:  Understand these instructions.  Get help right away if you are not doing well or get worse.   DENTAL ANTIBIOTICS:  In most cases prophylactic antibiotics for Dental procdeures after total joint surgery are  not necessary.  Exceptions are as follows:  1. History of prior total joint infection  2. Severely immunocompromised (Organ Transplant, cancer chemotherapy, Rheumatoid biologic meds such as Humera)  3. Poorly controlled diabetes (A1C &gt; 8.0, blood glucose over 200)  If you have one of these conditions, contact your surgeon for an antibiotic prescription, prior to your dental procedure.    Pick up stool softner and laxative for home use following surgery while on pain medications. Do not submerge incision under water. Please use good hand washing techniques while changing dressing each day. May shower starting three days after surgery. Please use a clean towel to pat the incision dry following showers. Continue to use ice for pain and swelling after surgery. Do not use any lotions or creams on the incision until instructed by your surgeon.  Information on my medicine - XARELTO (Rivaroxaban)  This medication education was reviewed with me or my healthcare representative as part of my discharge preparation.  The pharmacy personnel that spoke with me during my hospital stay was:    Why was Xarelto prescribed for you? Xarelto was prescribed for you to reduce the risk of blood clots forming after orthopedic surgery. The medical term for these abnormal blood clots is venous thromboembolism (VTE).  What do you need to know about xarelto ? Take your Xarelto ONCE DAILY at the same time every day. You may take it either with or without food.  If you have difficulty swallowing the tablet whole, you may crush it and mix in applesauce just prior to taking your dose.  Take Xarelto exactly as prescribed by your doctor and DO NOT stop taking Xarelto without talking to the doctor who prescribed the medication.  Stopping without other VTE prevention medication to take the place of Xarelto may increase your risk of developing a clot.  After discharge, you should have regular check-up  appointments with your healthcare provider that is prescribing your Xarelto.    What do you do if you miss a dose? If you miss a dose, take it as  soon as you remember on the same day then continue your regularly scheduled once daily regimen the next day. Do not take two doses of Xarelto on the same day.   Important Safety Information A possible side effect of Xarelto is bleeding. You should call your healthcare provider right away if you experience any of the following: Bleeding from an injury or your nose that does not stop. Unusual colored urine (red or dark brown) or unusual colored stools (red or black). Unusual bruising for unknown reasons. A serious fall or if you hit your head (even if there is no bleeding).  Some medicines may interact with Xarelto and might increase your risk of bleeding while on Xarelto. To help avoid this, consult your healthcare provider or pharmacist prior to using any new prescription or non-prescription medications, including herbals, vitamins, non-steroidal anti-inflammatory drugs (NSAIDs) and supplements.  This website has more information on Xarelto: VisitDestination.com.br.

## 2023-03-05 NOTE — Op Note (Addendum)
OPERATIVE REPORT-TOTAL KNEE ARTHROPLASTY   Pre-operative diagnosis- Osteoarthritis  Right knee(s)  Post-operative diagnosis- Osteoarthritis Right knee(s)  Procedure-  Right Total Knee Arthroplasty  Surgeon- Gus Rankin. Loran Auguste, MD  Assistant- Weston Brass, PA-C   Anesthesia-   Adductor canal block and spinal  EBL-25 mL   Drains None  Tourniquet time-  Total Tourniquet Time Documented: Thigh (Right) - 36 minutes Total: Thigh (Right) - 36 minutes     Complications- None  Condition-PACU - hemodynamically stable.   Brief Clinical Note  Shelly Mosley is a 77 y.o. year old female with end stage OA of her lright knee with progressively worsening pain and dysfunction. She has constant pain, with activity and at rest and significant functional deficits with difficulties even with ADLs. She has had extensive non-op management including analgesics, injections of cortisone and viscosupplements, and home exercise program, but remains in significant pain with significant dysfunction. Radiographs show bone on bone arthritis bone on bone medial and patellofemoral with large global osteophytes. She presents now for left Total Knee Arthroplasty.     Procedure in detail---   The patient is brought into the operating room and positioned supine on the operating table. After successful administration of  Adductor canal block and spinal,   a tourniquet is placed high on the  right thigh(s) and the lower extremity is prepped and draped in the usual sterile fashion. Time out is performed by the operating team and then the  Right lower extremity is wrapped in Esmarch, knee flexed and the tourniquet inflated to 300 mmHg.       A midline incision is made with a ten blade through the subcutaneous tissue to the level of the extensor mechanism. A fresh blade is used to make a medial parapatellar arthrotomy. Soft tissue over the proximal medial tibia is subperiosteally elevated to the joint line with a knife  and into the semimembranosus bursa with a Cobb elevator. Soft tissue over the proximal lateral tibia is elevated with attention being paid to avoiding the patellar tendon on the tibial tubercle. The patella is everted, knee flexed 90 degrees and the ACL and PCL are removed. Findings are bone on bone medial an patellofemoral with large global osteophytes.        The drill is used to create a starting hole in the distal femur and the canal is thoroughly irrigated with sterile saline to remove the fatty contents. The 5 degree Right valgus alignment guide is placed into the femoral canal and the distal femoral cutting block is pinned to remove 9 mm off the distal femur. Resection is made with an oscillating saw.      The tibia is subluxed forward and the menisci are removed. The extramedullary alignment guide is placed referencing proximally at the medial aspect of the tibial tubercle and distally along the second metatarsal axis and tibial crest. The block is pinned to remove 2mm off the more deficient medial  side. Resection is made with an oscillating saw. Size 5 is the most appropriate size for the tibia and the proximal tibia is prepared with the modular drill and keel punch for that size.      The femoral sizing guide is placed and size 6 is most appropriate. Rotation is marked off the epicondylar axis and confirmed by creating a rectangular flexion gap at 90 degrees. The size 6 cutting block is pinned in this rotation and the anterior, posterior and chamfer cuts are made with the oscillating saw. The intercondylar block is  then placed and that cut is made.      Trial size 5 tibial component, trial size 6 posterior stabilized femur and a 7  mm posterior stabilized rotating platform insert trial is placed. Full extension is achieved with excellent varus/valgus and anterior/posterior balance throughout full range of motion. The patella is everted and thickness measured to be 22  mm. Free hand resection is taken  to 12 mm, a 35 template is placed, lug holes are drilled, trial patella is placed, and it tracks normally. Osteophytes are removed off the posterior femur with the trial in place. All trials are removed and the cut bone surfaces prepared with pulsatile lavage. Cement is mixed and once ready for implantation, the size 5 tibial implant, size  6 posterior stabilized femoral component, and the size 35 patella are cemented in place and the patella is held with the clamp. The trial insert is placed and the knee held in full extension. The Exparel (20 ml mixed with 60 ml saline) is injected into the extensor mechanism, posterior capsule, medial and lateral gutters and subcutaneous tissues.  All extruded cement is removed and once the cement is hard the permanent 7 mm posterior stabilized rotating platform insert is placed into the tibial tray.      The wound is copiously irrigated with saline solution and the extensor mechanism closed with # 0 Stratofix suture. The tourniquet is released for a total tourniquet time of 36  minutes. Flexion against gravity is 130 degrees and the patella tracks normally. Subcutaneous tissue is closed with 2.0 vicryl and subcuticular with running 4.0 Monocryl. The incision is cleaned and dried and steri-strips and a bulky sterile dressing are applied. The limb is placed into a knee immobilizer and the patient is awakened and transported to recovery in stable condition.      Please note that a surgical assistant was a medical necessity for this procedure in order to perform it in a safe and expeditious manner. Surgical assistant was necessary to retract the ligaments and vital neurovascular structures to prevent injury to them and also necessary for proper positioning of the limb to allow for anatomic placement of the prosthesis.   Gus Rankin Turon Kilmer, MD    03/05/2023, 10:47 AM

## 2023-03-05 NOTE — Progress Notes (Signed)
Orthopedic Tech Progress Note Patient Details:  Shelly Mosley Jan 19, 1946 [id number] CPM will be removed at 4:20 pm.  CPM Right Knee CPM Right Knee: On Right Knee Flexion (Degrees): 40 Right Knee Extension (Degrees): 10  Post Interventions Patient Tolerated: Well  [name] Damien Batty 03/05/2023, 12:29 PM

## 2023-03-05 NOTE — Care Plan (Signed)
Ortho Bundle Case Management Note  Patient Details  Name: Shelly Mosley MRN: [id number] Date of Birth: 02/07/1946                  R TKA on 03-05-23  DCP: Home with sister  DME: RW ordered through [facility]  PT: Frederickson Kathryne Sharper on 03-08-23   DME Arranged:  Dan Humphreys rolling DME Agency:  [employer]   Additional Comments: Please contact me with any questions of if this plan should need to change.   Ennis Forts, RN,CCM [facility]  [phone] 03/05/2023, 11:52 AM

## 2023-03-05 NOTE — Interval H&P Note (Signed)
History and Physical Interval Note:  03/05/2023 8:37 AM  Shelly Mosley  has presented today for surgery, with the diagnosis of right knee osteoarthritis.  The various methods of treatment have been discussed with the patient and family. After consideration of risks, benefits and other options for treatment, the patient has consented to  Procedure(s): TOTAL KNEE ARTHROPLASTY (Right) as a surgical intervention.  The patient's history has been reviewed, patient examined, no change in status, stable for surgery.  I have reviewed the patient's chart and labs.  Questions were answered to the patient's satisfaction.     Homero Fellers Deaunna Olarte

## 2023-03-05 NOTE — Progress Notes (Signed)
Physical Therapy Evaluation Patient Details Name: Shelly Mosley MRN: [id number] DOB: 1945/10/19 Today's Date: 03/05/2023  History of Present Illness  77 yo female presents to therapy s/p R TKA on 03/05/2023 due to failure of conservative measures. Pt PMH includes but is not limited to: ILD, R 2 metatarsal fx, R lisfranc sprain, peripheral neuropathy, IBS, hypothyroidism, fibromyalgia, HTN, HDL, chronic back pain s/p sx, COPD, RBBB, and cancer.  Clinical Impression      Shelly Mosley is a 77 y.o. female POD 0 s/p R TKA. Patient reports mod I with mobility at baseline. Patient is now limited by functional impairments (see PT problem list below) and requires S for bed mobility and min guard and cues for transfers. Patient was able to ambulate 37 feet with RW and min guard and progressing to close S level of assist. Patient instructed in exercise to facilitate ROM and circulation to manage edema. Patient will benefit from continued skilled PT interventions to address impairments and progress towards PLOF. Acute PT will follow to progress mobility and stair training in preparation for safe discharge home with family support and OPPT services.     Assistance Recommended at Discharge Intermittent Supervision/Assistance  If plan is discharge home, recommend the following:  Can travel by private vehicle  A little help with walking and/or transfers;A little help with bathing/dressing/bathroom;Assistance with cooking/housework;Assist for transportation;Help with stairs or ramp for entrance        Equipment Recommendations Rolling walker (2 wheels)  Recommendations for Other Services       Functional Status Assessment Patient has had a recent decline in their functional status and demonstrates the ability to make significant improvements in function in a reasonable and predictable amount of time.     Precautions / Restrictions Precautions Precautions: Knee;Fall Restrictions Weight Bearing  Restrictions: No      Mobility  Bed Mobility Overal bed mobility: Needs Assistance Bed Mobility: Supine to Sit     Supine to sit: Supervision     General bed mobility comments: min cues with HOB elevated    Transfers Overall transfer level: Needs assistance Equipment used: Rolling walker (2 wheels) Transfers: Sit to/from Stand Sit to Stand: Min guard           General transfer comment: pt pull to stand at RW, may benefit from ed for push to stand for safety,    Ambulation/Gait Ambulation/Gait assistance: Min guard Gait Distance (Feet): 37 Feet Assistive device: Rolling walker (2 wheels) Gait Pattern/deviations: Step-to pattern, Antalgic, Wide base of support Gait velocity: decreased     General Gait Details: cues for step to pattern and maintaining RW on floor  Stairs            Wheelchair Mobility     Tilt Bed    Modified Rankin (Stroke Patients Only)       Balance Overall balance assessment: Needs assistance Sitting-balance support: Feet supported Sitting balance-Leahy Scale: Good     Standing balance support: Bilateral upper extremity supported, During functional activity, Reliant on assistive device for balance Standing balance-Leahy Scale: Poor                               Pertinent Vitals/Pain Pain Assessment Pain Assessment: 0-10 Pain Score: 5  Pain Location: R knee Pain Descriptors / Indicators: Aching, Constant, Discomfort, Operative site guarding Pain Intervention(s): Limited activity within patient's tolerance, Monitored during session, Premedicated before session, Repositioned, Patient requesting pain meds-RN notified,  Ice applied    Home Living Family/patient expects to be discharged to:: Private residence Living Arrangements: Other relatives (sister) Available Help at Discharge: Family Type of Home: House Home Access: Stairs to enter Entrance Stairs-Rails: None Secretary/administrator of Steps: 1   Home  Layout: One level Home Equipment: BSC/3in1;Cane - single point (elbow/loftstrand crutch)      Prior Function Prior Level of Function : Independent/Modified Independent             Mobility Comments: mod I with use of crutch for community navigation and furniture walking, mod I with all ADLs, slef care tasks, IADLs       Hand Dominance        Extremity/Trunk Assessment        Lower Extremity Assessment Lower Extremity Assessment: RLE deficits/detail RLE Deficits / Details: ankle DF/PF 5/5; SLR < 10 degree lag RLE Sensation: WNL    Cervical / Trunk Assessment Cervical / Trunk Assessment: Back Surgery  Communication   Communication: No difficulties  Cognition Arousal/Alertness: Awake/alert Behavior During Therapy: WFL for tasks assessed/performed Overall Cognitive Status: Within Functional Limits for tasks assessed                                          General Comments      Exercises Total Joint Exercises Ankle Circles/Pumps: AROM, Both, 20 reps   Assessment/Plan    PT Assessment Patient needs continued PT services  PT Problem List Decreased strength;Decreased range of motion;Decreased activity tolerance;Decreased balance;Decreased mobility;Decreased coordination;Pain       PT Treatment Interventions DME instruction;Gait training;Stair training;Functional mobility training;Therapeutic activities;Therapeutic exercise;Balance training;Neuromuscular re-education;Patient/family education;Modalities    PT Goals (Current goals can be found in the Care Plan section)  Acute Rehab PT Goals Patient Stated Goal: to be able to walk normally and get strong enough to have the other knee done (L ) PT Goal Formulation: With patient Time For Goal Achievement: 03/19/23 Potential to Achieve Goals: Good    Frequency 7X/week     Co-evaluation               AM-PAC PT "6 Clicks" Mobility  Outcome Measure Help needed turning from your back to your  side while in a flat bed without using bedrails?: A Little Help needed moving from lying on your back to sitting on the side of a flat bed without using bedrails?: A Little Help needed moving to and from a bed to a chair (including a wheelchair)?: A Little Help needed standing up from a chair using your arms (e.g., wheelchair or bedside chair)?: A Little Help needed to walk in hospital room?: A Little Help needed climbing 3-5 steps with a railing? : A Lot 6 Click Score: 17    End of Session         PT Visit Diagnosis: Unsteadiness on feet (R26.81);Other abnormalities of gait and mobility (R26.89);Muscle weakness (generalized) (M62.81);Pain;Difficulty in walking, not elsewhere classified (R26.2) Pain - Right/Left: Right Pain - part of body: Knee;Leg    Time: 1610-9604 PT Time Calculation (min) (ACUTE ONLY): 40 min   Charges:   PT Evaluation $PT Eval Low Complexity: 1 Low PT Treatments $Gait Training: 8-22 mins $Therapeutic Activity: 8-22 mins PT General Charges $$ ACUTE PT VISIT: 1 Visit         Johnny Bridge, PT Acute Rehab   Jacqualyn Posey 03/05/2023, 4:16 PM

## 2023-03-05 NOTE — Anesthesia Postprocedure Evaluation (Signed)
Anesthesia Post Note  Patient: Shelly Mosley  Procedure(s) Performed: TOTAL KNEE ARTHROPLASTY (Right: Knee)     Patient location during evaluation: PACU Anesthesia Type: Spinal Level of consciousness: awake and alert Pain management: pain level controlled Vital Signs Assessment: post-procedure vital signs reviewed and stable Respiratory status: spontaneous breathing and respiratory function stable Cardiovascular status: blood pressure returned to baseline and stable Postop Assessment: spinal receding and no apparent nausea or vomiting Anesthetic complications: no   No notable events documented.  Last Vitals:  Vitals:   03/05/23 1215 03/05/23 1230  BP: (!) 116/52 (!) 114/53  Pulse: (!) 51 (!) 58  Resp: 15 (!) 21  Temp:  (!) 36.4 C  SpO2: 91% 98%    Last Pain:  Vitals:   03/05/23 1230  TempSrc:   PainSc: 5                  Beryle Lathe

## 2023-03-05 NOTE — Transfer of Care (Signed)
Immediate Anesthesia Transfer of Care Note  Patient: Shelly Mosley  Procedure(s) Performed: TOTAL KNEE ARTHROPLASTY (Right: Knee)  Patient Location: PACU  Anesthesia Type:Spinal  Level of Consciousness: awake, alert , and oriented  Airway & Oxygen Therapy: Patient Spontanous Breathing and Patient connected to face mask oxygen  Post-op Assessment: Report given to RN and Post -op Vital signs reviewed and stable  Post vital signs: Reviewed and stable  Last Vitals:  Vitals Value Taken Time  BP    Temp    Pulse 68 03/05/23 1114  Resp 12 03/05/23 1114  SpO2 100 % 03/05/23 1114  Vitals shown include unfiled device data.  Last Pain:  Vitals:   03/05/23 0857  TempSrc:   PainSc: 0-No pain      Patients Stated Pain Goal: 0 (03/05/23 0739)  Complications: No notable events documented.

## 2023-03-05 NOTE — Anesthesia Procedure Notes (Signed)
Spinal  Patient location during procedure: OR End time: 03/05/2023 9:40 AM Reason for block: surgical anesthesia Staffing Performed: resident/CRNA and anesthesiologist  Anesthesiologist: Beryle Lathe, MD Resident/CRNA: Orest Dikes, CRNA Performed by: Orest Dikes, CRNA Authorized by: Beryle Lathe, MD   Preanesthetic Checklist Completed: patient identified, IV checked, site marked, risks and benefits discussed, surgical consent, monitors and equipment checked, pre-op evaluation and timeout performed Spinal Block Patient position: sitting Prep: DuraPrep Patient monitoring: heart rate, cardiac monitor, continuous pulse ox and blood pressure Approach: midline Location: L3-4 Injection technique: single-shot Needle Needle type: Pencan  Needle gauge: 24 G Needle length: 10 cm Assessment Sensory level: T4 Events: CSF return Additional Notes IV functioning, monitors applied to pt. Expiration date of kit checked and confirmed to be in date. Sterile prep and drape, hand hygiene and sterile gloves used. Pt was positioned and spine was prepped in sterile fashion. Skin was anesthetized with lidocaine. Free flow of clear CSF obtained prior to injecting local anesthetic into CSF x 1 attempt. Spinal needle aspirated freely following injection. Needle was carefully withdrawn, and pt tolerated procedure well. Loss of motor and sensory on exam post injection.

## 2023-03-05 NOTE — Anesthesia Procedure Notes (Signed)
Procedure Name: MAC Date/Time: 03/05/2023 9:32 AM  Performed by: Orest Dikes, CRNAPre-anesthesia Checklist: Patient identified, Emergency Drugs available, Suction available and Patient being monitored Oxygen Delivery Method: Simple face mask

## 2023-03-06 ENCOUNTER — Other Ambulatory Visit: Payer: Self-pay

## 2023-03-06 ENCOUNTER — Encounter (HOSPITAL_COMMUNITY): Payer: Self-pay | Admitting: Orthopedic Surgery

## 2023-03-06 DIAGNOSIS — I10 Essential (primary) hypertension: Secondary | ICD-10-CM | POA: Diagnosis not present

## 2023-03-06 DIAGNOSIS — M1711 Unilateral primary osteoarthritis, right knee: Secondary | ICD-10-CM | POA: Diagnosis not present

## 2023-03-06 DIAGNOSIS — Z8542 Personal history of malignant neoplasm of other parts of uterus: Secondary | ICD-10-CM | POA: Diagnosis not present

## 2023-03-06 DIAGNOSIS — Z853 Personal history of malignant neoplasm of breast: Secondary | ICD-10-CM | POA: Diagnosis not present

## 2023-03-06 DIAGNOSIS — J449 Chronic obstructive pulmonary disease, unspecified: Secondary | ICD-10-CM | POA: Diagnosis not present

## 2023-03-06 DIAGNOSIS — E039 Hypothyroidism, unspecified: Secondary | ICD-10-CM | POA: Diagnosis not present

## 2023-03-06 LAB — CBC
HCT: 37 % (ref 36.0–46.0)
Hemoglobin: 11.8 g/dL — ABNORMAL LOW (ref 12.0–15.0)
MCH: 29.6 pg (ref 26.0–34.0)
MCHC: 31.9 g/dL (ref 30.0–36.0)
MCV: 92.7 fL (ref 80.0–100.0)
Platelets: 160 10*3/uL (ref 150–400)
RBC: 3.99 MIL/uL (ref 3.87–5.11)
RDW: 12.3 % (ref 11.5–15.5)
WBC: 8.3 10*3/uL (ref 4.0–10.5)
nRBC: 0 % (ref 0.0–0.2)

## 2023-03-06 LAB — BASIC METABOLIC PANEL
Anion gap: 9 (ref 5–15)
BUN: 15 mg/dL (ref 8–23)
CO2: 26 mmol/L (ref 22–32)
Calcium: 9 mg/dL (ref 8.9–10.3)
Chloride: 102 mmol/L (ref 98–111)
Creatinine, Ser: 0.71 mg/dL (ref 0.44–1.00)
GFR, Estimated: >60 mL/min (ref >60)
Glucose, Bld: 123 mg/dL — ABNORMAL HIGH (ref 70–99)
Potassium: 4.5 mmol/L (ref 3.5–5.1)
Sodium: 137 mmol/L (ref 135–145)

## 2023-03-06 MED ORDER — HYDROMORPHONE HCL 2 MG PO TABS: 1.0000 mg | ORAL_TABLET | Freq: Four times a day (QID) | ORAL | 0 refills | Status: DC | PRN

## 2023-03-06 MED ORDER — ONDANSETRON HCL 4 MG PO TABS: 4.0000 mg | ORAL_TABLET | Freq: Four times a day (QID) | ORAL | 0 refills | Status: DC | PRN

## 2023-03-06 MED ORDER — RIVAROXABAN 10 MG PO TABS: 10.0000 mg | ORAL_TABLET | Freq: Every day | ORAL | 0 refills | Status: AC

## 2023-03-06 MED ORDER — TIZANIDINE HCL 4 MG PO TABS: 4.0000 mg | ORAL_TABLET | Freq: Four times a day (QID) | ORAL | 0 refills | Status: DC | PRN

## 2023-03-06 MED ORDER — TRAMADOL HCL 50 MG PO TABS: 50.0000 mg | ORAL_TABLET | Freq: Four times a day (QID) | ORAL | 0 refills | Status: DC | PRN
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
## 2023-03-06 NOTE — Care Management Obs Status (Signed)
MEDICARE OBSERVATION STATUS NOTIFICATION   Patient Details  Name: Shelly Mosley MRN: [id number] Date of Birth: [birth year]   Medicare Observation Status Notification Given:  Yes    Ewing Schlein, LCSW 03/06/2023, 1:36 PM

## 2023-03-06 NOTE — Progress Notes (Signed)
Physical Therapy Treatment Patient Details Name: Shelly Mosley MRN: [id number] DOB: [birth year] Today's Date: 03/06/2023   History of Present Illness 77 yo female presents to therapy s/p R TKA on 03/05/2023 due to failure of conservative measures. Pt PMH includes but is not limited to: ILD, R 2 metatarsal fx, R lisfranc sprain, peripheral neuropathy, IBS, hypothyroidism, fibromyalgia, HTN, HDL, chronic back pain s/p sx, COPD, RBBB, and cancer.    PT Comments  POD # 1 pm session Sister present during session for "Family Education".   Had Sister assist with amb and practice ONE step up backward/down forward.  Then returned to room to perform some TE's following HEP handout.  Instructed on proper tech, freq as well as use of ICE.   Addressed all mobility questions, discussed appropriate activity, educated on use of ICE.  Pt ready for D/C to home.     Assistance Recommended at Discharge    If plan is discharge home, recommend the following:  Can travel by private vehicle           Equipment Recommendations       Recommendations for Other Services       Precautions / Restrictions Precautions Precautions: Knee;Fall Precaution Comments: no pillow under knee Restrictions Weight Bearing Restrictions: No Other Position/Activity Restrictions: WBAT     Mobility  Bed Mobility               General bed mobility comments: OOB in recliner    Transfers Overall transfer level: Needs assistance Equipment used: Rolling walker (2 wheels) Transfers: Sit to/from Stand             General transfer comment: VC's on proper hand placement and safety with turns    Ambulation/Gait Ambulation/Gait assistance: Supervision, Min guard Gait Distance (Feet): 45 Feet Assistive device: Rolling walker (2 wheels) Gait Pattern/deviations: Step-to pattern, Antalgic, Wide base of support Gait velocity: decreased     General Gait Details: VC's on proper sequencing and safety with  turns   Stairs Stairs: Yes Stairs assistance: Min guard, Min assist Stair Management: No rails, Step to pattern, Backwards Number of Stairs: 1 General stair comments: up backward due to knee instability and Pt's BMI.  VC's on proper tech and safety.   Wheelchair Mobility     Tilt Bed    Modified Rankin (Stroke Patients Only)       Balance                                            Cognition Arousal/Alertness: Awake/alert Behavior During Therapy: WFL for tasks assessed/performed Overall Cognitive Status: Within Functional Limits for tasks assessed                                 General Comments: AxO x 3 very pleasant Lady who lives with her sister.  Slightly impulsive.  "I'm not one to sit around".        Exercises      General Comments        Pertinent Vitals/Pain Pain Assessment Pain Assessment: 0-10 Pain Score: 3  Pain Location: R knee Pain Descriptors / Indicators: Aching, Constant, Discomfort, Operative site guarding Pain Intervention(s): Monitored during session, Premedicated before session, Repositioned, Ice applied    Home Living  Prior Function            PT Goals (current goals can now be found in the care plan section) Progress towards PT goals: Progressing toward goals    Frequency           PT Plan      Co-evaluation              AM-PAC PT "6 Clicks" Mobility   Outcome Measure                   End of Session Equipment Utilized During Treatment: Gait belt Activity Tolerance: Patient tolerated treatment well Patient left: in chair;with call bell/phone within reach         Time: 5284-1324 PT Time Calculation (min) (ACUTE ONLY): 24 min  Charges:    $Gait Training: 8-22 mins $Therapeutic Exercise: 8-22 mins PT General Charges $$ ACUTE PT VISIT: 1 Visit                    Felecia Shelling  PTA Acute  Rehabilitation Services Office M-F           [phone]

## 2023-03-06 NOTE — Plan of Care (Signed)
  Problem: Education: Goal: Knowledge of the prescribed therapeutic regimen will improve Outcome: Progressing Goal: Individualized Educational Video(s) Outcome: Completed/Met   Problem: Activity: Goal: Ability to avoid complications of mobility impairment will improve Outcome: Progressing Goal: Range of joint motion will improve Outcome: Adequate for Discharge   Problem: Clinical Measurements: Goal: Postoperative complications will be avoided or minimized Outcome: Progressing   Problem: Pain Management: Goal: Pain level will decrease with appropriate interventions Outcome: Progressing   Problem: Skin Integrity: Goal: Will show signs of wound healing Outcome: Progressing   Problem: Education: Goal: Knowledge of General Education information will improve Description: Including pain rating scale, medication(s)/side effects and non-pharmacologic comfort measures Outcome: Progressing   Problem: Health Behavior/Discharge Planning: Goal: Ability to manage health-related needs will improve Outcome: Progressing   Problem: Clinical Measurements: Goal: Ability to maintain clinical measurements within normal limits will improve Outcome: Progressing Goal: Will remain free from infection Outcome: Progressing Goal: Diagnostic test results will improve Outcome: Progressing Goal: Respiratory complications will improve Outcome: Progressing Goal: Cardiovascular complication will be avoided Outcome: Progressing   Problem: Activity: Goal: Risk for activity intolerance will decrease Outcome: Progressing   Problem: Nutrition: Goal: Adequate nutrition will be maintained Outcome: Completed/Met   Problem: Coping: Goal: Level of anxiety will decrease Outcome: Progressing   Problem: Elimination: Goal: Will not experience complications related to bowel motility Outcome: Progressing Goal: Will not experience complications related to urinary retention Outcome: Progressing   Problem:  Pain Managment: Goal: General experience of comfort will improve Outcome: Progressing   Problem: Safety: Goal: Ability to remain free from injury will improve Outcome: Progressing   Problem: Skin Integrity: Goal: Risk for impaired skin integrity will decrease Outcome: Progressing   

## 2023-03-06 NOTE — Progress Notes (Signed)
Patient discharged to home w/ sister. Given all belongings, instructions, equipment. Verbalized understanding of all instructions. Escorted to pov via w/c.

## 2023-03-06 NOTE — TOC Transition Note (Signed)
Transition of Care [facility]) - CM/SW Discharge Note  Patient Details  Name: Shelly Mosley MRN: [id number] Date of Birth: 12-19-1945  [facility] CM/SW Contact:  Ewing Schlein, LCSW Phone Number: 03/06/2023, 10:00 AM  Clinical Narrative: Patient is expected to discharge home after working with PT. CSW met with patient to confirm discharge plan and needs. Patient will go home with OPPT at [facility]. Patient will need a rolling walker, which [employer] delivered to patient's room. TOC signing off.    Final next level of care: OP Rehab Barriers to Discharge: No Barriers Identified  Patient Goals and CMS Choice CMS Medicare.gov Compare Post Acute Care list provided to:: Patient Choice offered to / list presented to : Patient  Discharge Plan and Services Additional resources added to the After Visit Summary for         DME Arranged: Walker rolling DME Agency: [employer] Representative spoke with at DME Agency: Prearranged in orthopedist's office  Social Determinants of Health (SDOH) Interventions SDOH Screenings   Food Insecurity: No Food Insecurity (03/05/2023)  Housing: Low Risk  (03/05/2023)  Transportation Needs: No Transportation Needs (03/05/2023)  Utilities: Not At Risk (03/05/2023)  Alcohol Screen: Low Risk  (02/19/2023)  Depression (PHQ2-9): Low Risk  (02/19/2023)  Financial Resource Strain: Low Risk  (02/19/2023)  Physical Activity: Insufficiently Active (10/21/2020)  Social Connections: Socially Isolated (02/19/2023)  Stress: Stress Concern Present (02/19/2023)  Tobacco Use: Medium Risk (03/05/2023)   Readmission Risk Interventions     No data to display

## 2023-03-06 NOTE — Progress Notes (Signed)
Physical Therapy Treatment Patient Details Name: Shelly Mosley MRN: [id number] DOB: December 09, 1945 Today's Date: 03/06/2023   History of Present Illness 77 yo female presents to therapy s/p R TKA on 03/05/2023 due to failure of conservative measures. Pt PMH includes but is not limited to: ILD, R 2 metatarsal fx, R lisfranc sprain, peripheral neuropathy, IBS, hypothyroidism, fibromyalgia, HTN, HDL, chronic back pain s/p sx, COPD, RBBB, and cancer.    PT Comments  POD # 1 am session General Comments: AxO x 3 very pleasant Lady who lives with her sister.  Slightly impulsive.  "I'm not one to sit around".  Assisted with amb in hallway.  Practiced ONE step she has to enter her home.  Then returned to room to perform some TE's following HEP handout.  Instructed on proper tech, freq as well as use of ICE.   Will need a second PT session with Sister for "Family Ecucation" and repeat going/up and down ONE step.     Assistance Recommended at Discharge    If plan is discharge home, recommend the following:  Can travel by private vehicle           Equipment Recommendations       Recommendations for Other Services       Precautions / Restrictions Precautions Precautions: Knee;Fall Precaution Comments: no pillow under knee Restrictions Weight Bearing Restrictions: No Other Position/Activity Restrictions: WBAT     Mobility  Bed Mobility               General bed mobility comments: OOB in recliner    Transfers Overall transfer level: Needs assistance Equipment used: Rolling walker (2 wheels) Transfers: Sit to/from Stand             General transfer comment: VC's on proper hand placement and safety with turns    Ambulation/Gait Ambulation/Gait assistance: Supervision, Min guard Gait Distance (Feet): 28 Feet Assistive device: Rolling walker (2 wheels) Gait Pattern/deviations: Step-to pattern, Antalgic, Wide base of support Gait velocity: decreased     General Gait  Details: VC's on proper sequencing and safety with turns   Stairs Stairs: Yes Stairs assistance: Min guard, Min assist Stair Management: No rails, Step to pattern, Backwards Number of Stairs: 1 General stair comments: up backward due to knee instability and Pt's BMI.  VC's on proper tech and safety.   Wheelchair Mobility     Tilt Bed    Modified Rankin (Stroke Patients Only)       Balance                                            Cognition Arousal/Alertness: Awake/alert Behavior During Therapy: WFL for tasks assessed/performed Overall Cognitive Status: Within Functional Limits for tasks assessed                                 General Comments: AxO x 3 very pleasant Lady who lives with her sister.  Slightly impulsive.  "I'm not one to sit around".        Exercises  Total Knee Replacement TE's following HEP handout 10 reps B LE ankle pumps 05 reps towel squeezes 05 reps knee presses 05 reps heel slides  05 reps SAQ's 05 reps SLR's 05 reps ABD Educated on use of gait belt to assist with TE's Followed by ICE  General Comments        Pertinent Vitals/Pain Pain Assessment Pain Assessment: 0-10 Pain Score: 3  Pain Location: R knee Pain Descriptors / Indicators: Aching, Constant, Discomfort, Operative site guarding Pain Intervention(s): Monitored during session, Premedicated before session, Repositioned, Ice applied    Home Living                          Prior Function            PT Goals (current goals can now be found in the care plan section) Progress towards PT goals: Progressing toward goals    Frequency           PT Plan      Co-evaluation              AM-PAC PT "6 Clicks" Mobility   Outcome Measure                   End of Session Equipment Utilized During Treatment: Gait belt Activity Tolerance: Patient tolerated treatment well Patient left: in chair;with call  bell/phone within reach         Time: 0934-1002 PT Time Calculation (min) (ACUTE ONLY): 28 min  Charges:    $Gait Training: 8-22 mins $Therapeutic Exercise: 8-22 mins PT General Charges $$ ACUTE PT VISIT: 1 Visit                     Felecia Shelling  PTA Acute  Rehabilitation Services Office M-F          [phone]

## 2023-03-08 ENCOUNTER — Ambulatory Visit: Payer: Medicare Other | Attending: Orthopedic Surgery | Admitting: Physical Therapy

## 2023-03-08 ENCOUNTER — Other Ambulatory Visit: Payer: Self-pay

## 2023-03-08 ENCOUNTER — Encounter: Payer: Self-pay | Admitting: Physical Therapy

## 2023-03-08 DIAGNOSIS — M25661 Stiffness of right knee, not elsewhere classified: Secondary | ICD-10-CM | POA: Insufficient documentation

## 2023-03-08 DIAGNOSIS — M25561 Pain in right knee: Secondary | ICD-10-CM | POA: Insufficient documentation

## 2023-03-08 DIAGNOSIS — M6281 Muscle weakness (generalized): Secondary | ICD-10-CM | POA: Diagnosis not present

## 2023-03-08 DIAGNOSIS — R262 Difficulty in walking, not elsewhere classified: Secondary | ICD-10-CM | POA: Insufficient documentation
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
## 2023-03-12 ENCOUNTER — Ambulatory Visit: Payer: Medicare Other

## 2023-03-12 DIAGNOSIS — M6281 Muscle weakness (generalized): Secondary | ICD-10-CM | POA: Diagnosis not present

## 2023-03-12 DIAGNOSIS — M25661 Stiffness of right knee, not elsewhere classified: Secondary | ICD-10-CM

## 2023-03-12 DIAGNOSIS — M25561 Pain in right knee: Secondary | ICD-10-CM

## 2023-03-12 DIAGNOSIS — R262 Difficulty in walking, not elsewhere classified: Secondary | ICD-10-CM | POA: Diagnosis not present
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
## 2023-03-14 ENCOUNTER — Ambulatory Visit: Payer: Medicare Other

## 2023-03-14 DIAGNOSIS — M25661 Stiffness of right knee, not elsewhere classified: Secondary | ICD-10-CM

## 2023-03-14 DIAGNOSIS — M6281 Muscle weakness (generalized): Secondary | ICD-10-CM

## 2023-03-14 DIAGNOSIS — R262 Difficulty in walking, not elsewhere classified: Secondary | ICD-10-CM | POA: Diagnosis not present

## 2023-03-14 DIAGNOSIS — M25561 Pain in right knee: Secondary | ICD-10-CM | POA: Diagnosis not present
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
## 2023-03-19 ENCOUNTER — Encounter: Payer: Self-pay | Admitting: Physical Therapy

## 2023-03-19 ENCOUNTER — Ambulatory Visit: Payer: Medicare Other | Admitting: Physical Therapy

## 2023-03-19 DIAGNOSIS — M6281 Muscle weakness (generalized): Secondary | ICD-10-CM | POA: Diagnosis not present

## 2023-03-19 DIAGNOSIS — M25561 Pain in right knee: Secondary | ICD-10-CM

## 2023-03-19 DIAGNOSIS — R262 Difficulty in walking, not elsewhere classified: Secondary | ICD-10-CM

## 2023-03-19 DIAGNOSIS — M25661 Stiffness of right knee, not elsewhere classified: Secondary | ICD-10-CM | POA: Diagnosis not present

## 2023-03-21 ENCOUNTER — Encounter: Payer: Self-pay | Admitting: Physical Therapy

## 2023-03-21 ENCOUNTER — Ambulatory Visit: Payer: Medicare Other | Admitting: Physical Therapy

## 2023-03-21 DIAGNOSIS — M25661 Stiffness of right knee, not elsewhere classified: Secondary | ICD-10-CM

## 2023-03-21 DIAGNOSIS — M25561 Pain in right knee: Secondary | ICD-10-CM

## 2023-03-21 DIAGNOSIS — M6281 Muscle weakness (generalized): Secondary | ICD-10-CM

## 2023-03-21 DIAGNOSIS — R262 Difficulty in walking, not elsewhere classified: Secondary | ICD-10-CM | POA: Diagnosis not present
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
## 2023-03-25 ENCOUNTER — Other Ambulatory Visit: Payer: Self-pay | Admitting: Family Medicine

## 2023-03-25 DIAGNOSIS — E782 Mixed hyperlipidemia: Secondary | ICD-10-CM

## 2023-03-26 ENCOUNTER — Ambulatory Visit: Payer: Medicare Other | Attending: Orthopedic Surgery

## 2023-03-26 DIAGNOSIS — R262 Difficulty in walking, not elsewhere classified: Secondary | ICD-10-CM | POA: Diagnosis not present

## 2023-03-26 DIAGNOSIS — M25661 Stiffness of right knee, not elsewhere classified: Secondary | ICD-10-CM | POA: Insufficient documentation

## 2023-03-26 DIAGNOSIS — M25561 Pain in right knee: Secondary | ICD-10-CM | POA: Diagnosis not present

## 2023-03-26 DIAGNOSIS — M6281 Muscle weakness (generalized): Secondary | ICD-10-CM | POA: Diagnosis not present
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
## 2023-03-28 ENCOUNTER — Encounter: Payer: Self-pay | Admitting: Physical Therapy

## 2023-03-28 ENCOUNTER — Ambulatory Visit: Payer: Medicare Other | Admitting: Physical Therapy

## 2023-03-28 DIAGNOSIS — M6281 Muscle weakness (generalized): Secondary | ICD-10-CM

## 2023-03-28 DIAGNOSIS — M25561 Pain in right knee: Secondary | ICD-10-CM | POA: Diagnosis not present

## 2023-03-28 DIAGNOSIS — R262 Difficulty in walking, not elsewhere classified: Secondary | ICD-10-CM

## 2023-03-28 DIAGNOSIS — M25661 Stiffness of right knee, not elsewhere classified: Secondary | ICD-10-CM | POA: Diagnosis not present
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
## 2023-04-02 ENCOUNTER — Encounter: Payer: Self-pay | Admitting: Rehabilitative and Restorative Service Providers"

## 2023-04-02 ENCOUNTER — Ambulatory Visit: Payer: Medicare Other | Admitting: Rehabilitative and Restorative Service Providers"

## 2023-04-02 DIAGNOSIS — R262 Difficulty in walking, not elsewhere classified: Secondary | ICD-10-CM | POA: Diagnosis not present

## 2023-04-02 DIAGNOSIS — M6281 Muscle weakness (generalized): Secondary | ICD-10-CM

## 2023-04-02 DIAGNOSIS — M25661 Stiffness of right knee, not elsewhere classified: Secondary | ICD-10-CM | POA: Diagnosis not present

## 2023-04-02 DIAGNOSIS — M25561 Pain in right knee: Secondary | ICD-10-CM | POA: Diagnosis not present
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
## 2023-04-04 ENCOUNTER — Ambulatory Visit: Payer: Medicare Other

## 2023-04-04 DIAGNOSIS — M25661 Stiffness of right knee, not elsewhere classified: Secondary | ICD-10-CM | POA: Diagnosis not present

## 2023-04-04 DIAGNOSIS — R262 Difficulty in walking, not elsewhere classified: Secondary | ICD-10-CM

## 2023-04-04 DIAGNOSIS — M6281 Muscle weakness (generalized): Secondary | ICD-10-CM | POA: Diagnosis not present

## 2023-04-04 DIAGNOSIS — M25561 Pain in right knee: Secondary | ICD-10-CM | POA: Diagnosis not present
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
## 2023-04-10 DIAGNOSIS — Z96651 Presence of right artificial knee joint: Secondary | ICD-10-CM | POA: Diagnosis not present

## 2023-04-10 DIAGNOSIS — Z471 Aftercare following joint replacement surgery: Secondary | ICD-10-CM | POA: Diagnosis not present

## 2023-04-13 ENCOUNTER — Ambulatory Visit: Payer: Medicare Other

## 2023-04-17 ENCOUNTER — Other Ambulatory Visit (INDEPENDENT_AMBULATORY_CARE_PROVIDER_SITE_OTHER): Payer: Medicare Other

## 2023-04-17 DIAGNOSIS — E039 Hypothyroidism, unspecified: Secondary | ICD-10-CM

## 2023-04-17 LAB — TSH: TSH: 0.15 u[IU]/mL — ABNORMAL LOW (ref 0.35–5.50)

## 2023-04-18 ENCOUNTER — Encounter: Payer: Self-pay | Admitting: Family Medicine

## 2023-04-18 MED ORDER — LEVOTHYROXINE SODIUM 150 MCG PO TABS: 150.0000 ug | ORAL_TABLET | Freq: Every day | ORAL | 3 refills | Status: DC

## 2023-04-18 NOTE — Progress Notes (Signed)
Results for orders placed or performed in visit on 04/17/23  TSH  Result Value Ref Range   TSH 0.15 (L) 0.35 - 5.50 uIU/mL   Message to pt We backed her levothyroxine down from 250 to 200, now will drop to 150 Message to pt

## 2023-04-18 NOTE — Addendum Note (Signed)
Addended by: Abbe Amsterdam C on: 04/18/2023 06:00 AM   Modules accepted: Orders

## 2023-04-27 ENCOUNTER — Encounter: Payer: Medicare Other | Admitting: Physical Therapy

## 2023-05-11 DIAGNOSIS — Z23 Encounter for immunization: Secondary | ICD-10-CM | POA: Diagnosis not present

## 2023-05-26 NOTE — Patient Instructions (Incomplete)
It was good to see you again today, I will be in touch with your thyroid
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
## 2023-05-30 ENCOUNTER — Encounter: Payer: Self-pay | Admitting: Family Medicine

## 2023-05-30 ENCOUNTER — Ambulatory Visit (INDEPENDENT_AMBULATORY_CARE_PROVIDER_SITE_OTHER): Payer: Medicare Other | Admitting: Family Medicine

## 2023-05-30 VITALS — BP 128/80 | HR 63 | Temp 97.8°F | Resp 18 | Ht 66.0 in | Wt 267.4 lb

## 2023-05-30 DIAGNOSIS — I10 Essential (primary) hypertension: Secondary | ICD-10-CM | POA: Diagnosis not present

## 2023-05-30 DIAGNOSIS — R52 Pain, unspecified: Secondary | ICD-10-CM | POA: Diagnosis not present

## 2023-05-30 DIAGNOSIS — R1310 Dysphagia, unspecified: Secondary | ICD-10-CM

## 2023-05-30 DIAGNOSIS — E039 Hypothyroidism, unspecified: Secondary | ICD-10-CM | POA: Diagnosis not present

## 2023-05-30 DIAGNOSIS — D509 Iron deficiency anemia, unspecified: Secondary | ICD-10-CM

## 2023-05-30 DIAGNOSIS — G6289 Other specified polyneuropathies: Secondary | ICD-10-CM | POA: Diagnosis not present

## 2023-05-30 DIAGNOSIS — R748 Abnormal levels of other serum enzymes: Secondary | ICD-10-CM

## 2023-05-30 DIAGNOSIS — R6889 Other general symptoms and signs: Secondary | ICD-10-CM | POA: Diagnosis not present

## 2023-05-30 DIAGNOSIS — R5383 Other fatigue: Secondary | ICD-10-CM | POA: Diagnosis not present

## 2023-05-30 DIAGNOSIS — E782 Mixed hyperlipidemia: Secondary | ICD-10-CM

## 2023-05-30 LAB — CBC
HCT: 37.1 % (ref 36.0–46.0)
Hemoglobin: 11.9 g/dL — ABNORMAL LOW (ref 12.0–15.0)
MCHC: 32 g/dL (ref 30.0–36.0)
MCV: 92.1 fL (ref 78.0–100.0)
Platelets: 227 10*3/uL (ref 150.0–400.0)
RBC: 4.03 Mil/uL (ref 3.87–5.11)
RDW: 14.1 % (ref 11.5–15.5)
WBC: 5.7 10*3/uL (ref 4.0–10.5)

## 2023-05-30 LAB — COMPREHENSIVE METABOLIC PANEL
ALT: 27 U/L (ref 0–35)
AST: 25 U/L (ref 0–37)
Albumin: 4.1 g/dL (ref 3.5–5.2)
Alkaline Phosphatase: 168 U/L — ABNORMAL HIGH (ref 39–117)
BUN: 21 mg/dL (ref 6–23)
CO2: 32 meq/L (ref 19–32)
Calcium: 9.5 mg/dL (ref 8.4–10.5)
Chloride: 99 meq/L (ref 96–112)
Creatinine, Ser: 0.69 mg/dL (ref 0.40–1.20)
GFR: 83.81 mL/min (ref >60.00)
Glucose, Bld: 80 mg/dL (ref 70–99)
Potassium: 4.3 meq/L (ref 3.5–5.1)
Sodium: 140 meq/L (ref 135–145)
Total Bilirubin: 0.3 mg/dL (ref 0.2–1.2)
Total Protein: 6.4 g/dL (ref 6.0–8.3)

## 2023-05-30 LAB — SEDIMENTATION RATE: Sed Rate: 41 mm/h — ABNORMAL HIGH (ref 0–30)

## 2023-05-30 LAB — VITAMIN B12: Vitamin B-12: 366 pg/mL (ref 211–911)

## 2023-05-30 LAB — FERRITIN: Ferritin: 143.1 ng/mL (ref 10.0–291.0)

## 2023-05-30 LAB — TSH: TSH: 0.62 u[IU]/mL (ref 0.35–5.50)

## 2023-05-30 MED ORDER — TRAMADOL HCL 50 MG PO TABS
50.0000 mg | ORAL_TABLET | Freq: Three times a day (TID) | ORAL | 1 refills | Status: DC | PRN
Start: 2023-05-30 — End: 2023-06-08

## 2023-05-30 MED ORDER — PREGABALIN 100 MG PO CAPS
ORAL_CAPSULE | ORAL | 1 refills | Status: DC
Start: 2023-05-30 — End: 2023-09-17

## 2023-06-08 ENCOUNTER — Other Ambulatory Visit: Payer: Self-pay | Admitting: Family Medicine

## 2023-06-08 DIAGNOSIS — G6289 Other specified polyneuropathies: Secondary | ICD-10-CM

## 2023-06-11 ENCOUNTER — Encounter: Payer: Self-pay | Admitting: Family Medicine

## 2023-06-11 NOTE — Telephone Encounter (Signed)
No Ozempic samples at the moment.

## 2023-06-13 ENCOUNTER — Encounter: Payer: Self-pay | Admitting: Pulmonary Disease

## 2023-06-13 ENCOUNTER — Other Ambulatory Visit: Payer: Self-pay | Admitting: Pulmonary Disease

## 2023-06-13 ENCOUNTER — Ambulatory Visit (INDEPENDENT_AMBULATORY_CARE_PROVIDER_SITE_OTHER): Payer: Medicare Other | Admitting: Pulmonary Disease

## 2023-06-13 VITALS — BP 132/70 | HR 60 | Ht 67.5 in | Wt 275.2 lb

## 2023-06-13 DIAGNOSIS — J432 Centrilobular emphysema: Secondary | ICD-10-CM | POA: Diagnosis not present

## 2023-06-13 DIAGNOSIS — J849 Interstitial pulmonary disease, unspecified: Secondary | ICD-10-CM | POA: Diagnosis not present

## 2023-06-13 DIAGNOSIS — J42 Unspecified chronic bronchitis: Secondary | ICD-10-CM

## 2023-06-13 MED ORDER — PREDNISONE 10 MG PO TABS: 20.0000 mg | ORAL_TABLET | Freq: Every day | ORAL | 1 refills | Status: DC

## 2023-06-13 MED ORDER — OZEMPIC (0.25 OR 0.5 MG/DOSE) 2 MG/3ML ~~LOC~~ SOPN
0.5000 mL | PEN_INJECTOR | SUBCUTANEOUS | Status: DC
Start: 2023-06-13 — End: 2023-07-03

## 2023-06-13 NOTE — Patient Instructions (Signed)
Prednisone 10 mg to be used about 5 to 7 days  Follow-up in about 6 months  Call us with significant concerns  Continue using your inhaler on a regular basis  Continue graded activities as tolerated
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
## 2023-07-03 ENCOUNTER — Ambulatory Visit
Admission: RE | Admit: 2023-07-03 | Discharge: 2023-07-03 | Disposition: A | Discharge status: Home / Self Care | Payer: Medicare Other | Source: Ambulatory Visit | Attending: Family Medicine | Admitting: Family Medicine

## 2023-07-03 ENCOUNTER — Ambulatory Visit: Payer: Self-pay

## 2023-07-03 VITALS — BP 95/54 | HR 74 | Temp 97.9°F | Resp 16

## 2023-07-03 DIAGNOSIS — N3000 Acute cystitis without hematuria: Secondary | ICD-10-CM

## 2023-07-03 DIAGNOSIS — R350 Frequency of micturition: Secondary | ICD-10-CM | POA: Diagnosis not present

## 2023-07-03 LAB — POCT URINALYSIS DIP (MANUAL ENTRY)
Bilirubin, UA: NEGATIVE
Blood, UA: NEGATIVE
Glucose, UA: NEGATIVE mg/dL
Ketones, POC UA: NEGATIVE mg/dL
Nitrite, UA: NEGATIVE
Protein Ur, POC: NEGATIVE mg/dL
Spec Grav, UA: 1.01 (ref 1.010–1.025)
Urobilinogen, UA: 0.2 U/dL
pH, UA: 5.5 (ref 5.0–8.0)

## 2023-07-03 MED ORDER — CEPHALEXIN 500 MG PO CAPS: 500.0000 mg | ORAL_CAPSULE | Freq: Two times a day (BID) | ORAL | 0 refills | Status: AC

## 2023-07-03 NOTE — Discharge Instructions (Addendum)
Advised patient to take medication as directed with food to completion.  Encouraged increase daily water intake to 64 ounces per day while taking this medication.  Advised we will follow-up with urine culture results once received.

## 2023-07-03 NOTE — ED Triage Notes (Signed)
[facility] presents to [facility] with [facility] of urinary urgency, pressure and pelvic cramping since yesterday pt has pmh of frequent bladder infections.
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
## 2023-07-05 LAB — URINE CULTURE: Culture: >=100000 — AB

## 2023-08-02 DIAGNOSIS — R1314 Dysphagia, pharyngoesophageal phase: Secondary | ICD-10-CM | POA: Diagnosis not present

## 2023-08-08 ENCOUNTER — Other Ambulatory Visit (HOSPITAL_COMMUNITY): Payer: Self-pay | Admitting: Medical

## 2023-08-08 ENCOUNTER — Ambulatory Visit (HOSPITAL_COMMUNITY): Payer: Medicare Other

## 2023-08-08 DIAGNOSIS — M79672 Pain in left foot: Secondary | ICD-10-CM | POA: Diagnosis not present

## 2023-08-08 DIAGNOSIS — M25572 Pain in left ankle and joints of left foot: Secondary | ICD-10-CM | POA: Insufficient documentation

## 2023-08-10 ENCOUNTER — Encounter: Payer: Self-pay | Admitting: Obstetrics and Gynecology

## 2023-08-10 DIAGNOSIS — Z1231 Encounter for screening mammogram for malignant neoplasm of breast: Secondary | ICD-10-CM | POA: Diagnosis not present

## 2023-08-10 LAB — HM MAMMOGRAPHY

## 2023-08-13 ENCOUNTER — Encounter: Payer: Self-pay | Admitting: Family Medicine

## 2023-08-29 ENCOUNTER — Encounter: Payer: Self-pay | Admitting: Family Medicine

## 2023-08-29 DIAGNOSIS — R92333 Mammographic heterogeneous density, bilateral breasts: Secondary | ICD-10-CM | POA: Diagnosis not present

## 2023-08-29 DIAGNOSIS — N6001 Solitary cyst of right breast: Secondary | ICD-10-CM | POA: Diagnosis not present

## 2023-08-29 DIAGNOSIS — N6311 Unspecified lump in the right breast, upper outer quadrant: Secondary | ICD-10-CM | POA: Diagnosis not present

## 2023-08-30 ENCOUNTER — Other Ambulatory Visit: Payer: Self-pay | Admitting: Family Medicine

## 2023-08-30 DIAGNOSIS — G6289 Other specified polyneuropathies: Secondary | ICD-10-CM

## 2023-09-17 ENCOUNTER — Other Ambulatory Visit: Payer: Self-pay | Admitting: Family Medicine

## 2023-09-17 DIAGNOSIS — G6289 Other specified polyneuropathies: Secondary | ICD-10-CM

## 2023-09-27 ENCOUNTER — Telehealth: Payer: Self-pay | Admitting: Family Medicine

## 2023-09-27 NOTE — Telephone Encounter (Signed)
 Copied from CRM [id number]. Topic: Medicare AWV >> Sep 27, 2023  3:18 PM Nathanel DEL wrote: Reason for CRM: Called LVM 09/26/2023 to schedule AWV. Please schedule Virtual or Telehealth visits ONLY.   Nathanel Paschal; Care Guide Ambulatory Clinical Support [facility] Direct Dial: [phone]

## 2023-09-29 ENCOUNTER — Other Ambulatory Visit: Payer: Self-pay

## 2023-09-29 ENCOUNTER — Ambulatory Visit
Admission: RE | Admit: 2023-09-29 | Discharge: 2023-09-29 | Disposition: A | Discharge status: Home / Self Care | Payer: Medicare Other | Source: Ambulatory Visit | Attending: Family Medicine | Admitting: Family Medicine

## 2023-09-29 VITALS — BP 159/82 | HR 62 | Temp 97.7°F | Resp 17

## 2023-09-29 DIAGNOSIS — R059 Cough, unspecified: Secondary | ICD-10-CM

## 2023-09-29 DIAGNOSIS — H6693 Otitis media, unspecified, bilateral: Secondary | ICD-10-CM | POA: Diagnosis not present

## 2023-09-29 MED ORDER — AMOXICILLIN 875 MG PO TABS: 875.0000 mg | ORAL_TABLET | Freq: Two times a day (BID) | ORAL | 0 refills | Status: AC

## 2023-09-29 MED ORDER — BENZONATATE 200 MG PO CAPS: 200.0000 mg | ORAL_CAPSULE | Freq: Three times a day (TID) | ORAL | 0 refills | Status: AC | PRN

## 2023-09-29 MED ORDER — PREDNISONE 10 MG (21) PO TBPK: ORAL_TABLET | Freq: Every day | ORAL | 0 refills | Status: DC

## 2023-09-29 MED ORDER — PROMETHAZINE-DM 6.25-15 MG/5ML PO SYRP: 5.0000 mL | ORAL_SOLUTION | Freq: Two times a day (BID) | ORAL | 0 refills | Status: DC | PRN

## 2023-09-29 NOTE — ED Triage Notes (Signed)
 Pt c/o cough x 2 weeks, along with post nasal drainage. RT ear pain x 1 week. Taking ibuprofen prn.
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
## 2023-09-29 NOTE — Discharge Instructions (Addendum)
 Advised patient to take medication as directed with food to completion.  Advised patient to take prednisone  with first dose of amoxicillin .  Advised may take Tessalon  capsules daily or as needed for cough.  Advised may use Promethazine  DM at night prior to sleep for cough due to sedative effects.  Encouraged to increase daily water  intake to 64 ounces per day while taking these medications.  Advised if symptoms worsen and/or unresolved please follow-up with your PCP or here for further evaluation.

## 2023-10-09 DIAGNOSIS — R1314 Dysphagia, pharyngoesophageal phase: Secondary | ICD-10-CM | POA: Diagnosis not present

## 2023-10-09 DIAGNOSIS — K449 Diaphragmatic hernia without obstruction or gangrene: Secondary | ICD-10-CM | POA: Diagnosis not present

## 2023-10-09 DIAGNOSIS — K2100 Gastro-esophageal reflux disease with esophagitis, without bleeding: Secondary | ICD-10-CM | POA: Diagnosis not present

## 2023-10-09 DIAGNOSIS — K3189 Other diseases of stomach and duodenum: Secondary | ICD-10-CM | POA: Diagnosis not present

## 2023-10-09 DIAGNOSIS — K2950 Unspecified chronic gastritis without bleeding: Secondary | ICD-10-CM | POA: Diagnosis not present

## 2023-10-09 DIAGNOSIS — K208 Other esophagitis without bleeding: Secondary | ICD-10-CM | POA: Diagnosis not present

## 2023-10-14 NOTE — Patient Instructions (Addendum)
 Great to see you again today!   Let me know if you want to change up anything with your depression treatment Set up lung cancer screening CT for next month, and bone density at your convenience I will get you set up to see cardiology here at the [facility]   Assuming all is well please see me in 6 months
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
## 2023-10-22 ENCOUNTER — Encounter: Payer: Self-pay | Admitting: Family Medicine

## 2023-10-22 ENCOUNTER — Ambulatory Visit (INDEPENDENT_AMBULATORY_CARE_PROVIDER_SITE_OTHER): Payer: Medicare Other | Admitting: Family Medicine

## 2023-10-22 VITALS — BP 120/72 | HR 82 | Temp 97.9°F | Resp 18 | Ht 66.0 in

## 2023-10-22 DIAGNOSIS — Z87891 Personal history of nicotine dependence: Secondary | ICD-10-CM

## 2023-10-22 DIAGNOSIS — R931 Abnormal findings on diagnostic imaging of heart and coronary circulation: Secondary | ICD-10-CM

## 2023-10-22 DIAGNOSIS — Z131 Encounter for screening for diabetes mellitus: Secondary | ICD-10-CM

## 2023-10-22 DIAGNOSIS — R052 Subacute cough: Secondary | ICD-10-CM

## 2023-10-22 DIAGNOSIS — Z122 Encounter for screening for malignant neoplasm of respiratory organs: Secondary | ICD-10-CM | POA: Diagnosis not present

## 2023-10-22 DIAGNOSIS — E039 Hypothyroidism, unspecified: Secondary | ICD-10-CM | POA: Diagnosis not present

## 2023-10-22 DIAGNOSIS — R748 Abnormal levels of other serum enzymes: Secondary | ICD-10-CM

## 2023-10-22 DIAGNOSIS — E782 Mixed hyperlipidemia: Secondary | ICD-10-CM | POA: Diagnosis not present

## 2023-10-22 DIAGNOSIS — I10 Essential (primary) hypertension: Secondary | ICD-10-CM

## 2023-10-22 DIAGNOSIS — I890 Lymphedema, not elsewhere classified: Secondary | ICD-10-CM | POA: Diagnosis not present

## 2023-10-22 DIAGNOSIS — E2839 Other primary ovarian failure: Secondary | ICD-10-CM | POA: Diagnosis not present

## 2023-10-22 LAB — TSH: TSH: 2.68 u[IU]/mL (ref 0.35–5.50)

## 2023-10-22 LAB — COMPREHENSIVE METABOLIC PANEL
ALT: 34 U/L (ref 0–35)
AST: 26 U/L (ref 0–37)
Albumin: 4.4 g/dL (ref 3.5–5.2)
Alkaline Phosphatase: 129 U/L — ABNORMAL HIGH (ref 39–117)
BUN: 15 mg/dL (ref 6–23)
CO2: 30 meq/L (ref 19–32)
Calcium: 9.4 mg/dL (ref 8.4–10.5)
Chloride: 98 meq/L (ref 96–112)
Creatinine, Ser: 0.82 mg/dL (ref 0.40–1.20)
GFR: 68.88 mL/min (ref >60.00)
Glucose, Bld: 100 mg/dL — ABNORMAL HIGH (ref 70–99)
Potassium: 4.1 meq/L (ref 3.5–5.1)
Sodium: 139 meq/L (ref 135–145)
Total Bilirubin: 0.5 mg/dL (ref 0.2–1.2)
Total Protein: 7.1 g/dL (ref 6.0–8.3)

## 2023-10-22 LAB — LIPID PANEL
Cholesterol: 170 mg/dL (ref 0–200)
HDL: 56.2 mg/dL (ref >39.00)
LDL Cholesterol: 80 mg/dL (ref 0–99)
NonHDL: 114.24
Total CHOL/HDL Ratio: 3
Triglycerides: 173 mg/dL — ABNORMAL HIGH (ref 0.0–149.0)
VLDL: 34.6 mg/dL (ref 0.0–40.0)

## 2023-10-22 LAB — CBC
HCT: 39.7 % (ref 36.0–46.0)
Hemoglobin: 12.9 g/dL (ref 12.0–15.0)
MCHC: 32.4 g/dL (ref 30.0–36.0)
MCV: 96.3 fl (ref 78.0–100.0)
Platelets: 237 10*3/uL (ref 150.0–400.0)
RBC: 4.12 Mil/uL (ref 3.87–5.11)
RDW: 13.6 % (ref 11.5–15.5)
WBC: 6.8 10*3/uL (ref 4.0–10.5)

## 2023-10-22 LAB — HEMOGLOBIN A1C: Hgb A1c MFr Bld: 6.2 % (ref 4.6–6.5)

## 2023-10-22 LAB — GAMMA GT: GGT: 70 U/L — ABNORMAL HIGH (ref 7–51)

## 2023-10-22 MED ORDER — ATORVASTATIN CALCIUM 20 MG PO TABS: ORAL_TABLET | ORAL | 3 refills | Status: AC

## 2023-10-22 MED ORDER — FUROSEMIDE 80 MG PO TABS
ORAL_TABLET | ORAL | 3 refills | Status: AC
Start: 2023-10-22 — End: ?

## 2023-10-22 MED ORDER — LOSARTAN POTASSIUM 50 MG PO TABS: ORAL_TABLET | ORAL | 3 refills | Status: AC

## 2023-10-22 MED ORDER — BENZONATATE 100 MG PO CAPS: 100.0000 mg | ORAL_CAPSULE | Freq: Three times a day (TID) | ORAL | 2 refills | Status: AC | PRN

## 2023-10-31 ENCOUNTER — Ambulatory Visit: Admitting: *Deleted

## 2023-10-31 ENCOUNTER — Ambulatory Visit: Payer: Medicare Other

## 2023-10-31 ENCOUNTER — Ambulatory Visit

## 2023-10-31 VITALS — Ht 66.0 in | Wt 271.0 lb

## 2023-10-31 DIAGNOSIS — Z0000 Encounter for general adult medical examination without abnormal findings: Secondary | ICD-10-CM | POA: Diagnosis not present
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
## 2023-10-31 NOTE — Patient Instructions (Signed)
 Shelly Mosley , Thank you for taking time to come for your Medicare Wellness Visit. I appreciate your ongoing commitment to your health goals. Please review the following plan we discussed and let me know if I can assist you in the future.   This is a list of the screening recommended for you and due dates:  Health Maintenance  Topic Date Due   Screening for Lung Cancer  10/25/2023   COVID-19 Vaccine (8 - Pfizer risk 2024-25 season) 11/06/2023   Medicare Annual Wellness Visit  10/30/2024   DTaP/Tdap/Td vaccine (4 - Td or Tdap) 02/24/2031   Pneumonia Vaccine  Completed   Flu Shot  Completed   DEXA scan (bone density measurement)  Completed   Hepatitis C Screening  Completed   Zoster (Shingles) Vaccine  Completed   HPV Vaccine  Aged Out   Colon Cancer Screening  Discontinued    Next appointment: Follow up in one year for your annual wellness visit.   Preventive Care 51 Years and Older, Female Preventive care refers to lifestyle choices and visits with your health care provider that can promote health and wellness. What does preventive care include? A yearly physical exam. This is also called an annual well check. Dental exams once or twice a year. Routine eye exams. Ask your health care provider how often you should have your eyes checked. Personal lifestyle choices, including: Daily care of your teeth and gums. Regular physical activity. Eating a healthy diet. Avoiding tobacco and drug use. Limiting alcohol use. Practicing safe sex. Taking low-dose aspirin every day. Taking vitamin and mineral supplements as recommended by your health care provider. What happens during an annual well check? The services and screenings done by your health care provider during your annual well check will depend on your age, overall health, lifestyle risk factors, and family history of disease. Counseling  Your health care provider may ask you questions about your: Alcohol use. Tobacco use. Drug  use. Emotional well-being. Home and relationship well-being. Sexual activity. Eating habits. History of falls. Memory and ability to understand (cognition). Work and work Astronomer. Reproductive health. Screening  You may have the following tests or measurements: Height, weight, and BMI. Blood pressure. Lipid and cholesterol levels. These may be checked every 5 years, or more frequently if you are over 31 years old. Skin check. Lung cancer screening. You may have this screening every year starting at age 7 if you have a 30-pack-year history of smoking and currently smoke or have quit within the past 15 years. Fecal occult blood test (FOBT) of the stool. You may have this test every year starting at age 83. Flexible sigmoidoscopy or colonoscopy. You may have a sigmoidoscopy every 5 years or a colonoscopy every 10 years starting at age 77. Hepatitis C blood test. Hepatitis B blood test. Sexually transmitted disease (STD) testing. Diabetes screening. This is done by checking your blood sugar (glucose) after you have not eaten for a while (fasting). You may have this done every 1-3 years. Bone density scan. This is done to screen for osteoporosis. You may have this done starting at age 48. Mammogram. This may be done every 1-2 years. Talk to your health care provider about how often you should have regular mammograms. Talk with your health care provider about your test results, treatment options, and if necessary, the need for more tests. Vaccines  Your health care provider may recommend certain vaccines, such as: Influenza vaccine. This is recommended every year. Tetanus, diphtheria, and acellular pertussis (  Tdap, Td) vaccine. You may need a Td booster every 10 years. Zoster vaccine. You may need this after age 31. Pneumococcal 13-valent conjugate (PCV13) vaccine. One dose is recommended after age 77. Pneumococcal polysaccharide (PPSV23) vaccine. One dose is recommended after age  32. Talk to your health care provider about which screenings and vaccines you need and how often you need them. This information is not intended to replace advice given to you by your health care provider. Make sure you discuss any questions you have with your health care provider. Document Released: 09/03/2015 Document Revised: 04/26/2016 Document Reviewed: 06/08/2015 Elsevier Interactive Patient Education  2017 [employer].  Fall Prevention in the Home Falls can cause injuries. They can happen to people of all ages. There are many things you can do to make your home safe and to help prevent falls. What can I do on the outside of my home? Regularly fix the edges of walkways and driveways and fix any cracks. Remove anything that might make you trip as you walk through a door, such as a raised step or threshold. Trim any bushes or trees on the path to your home. Use bright outdoor lighting. Clear any walking paths of anything that might make someone trip, such as rocks or tools. Regularly check to see if handrails are loose or broken. Make sure that both sides of any steps have handrails. Any raised decks and porches should have guardrails on the edges. Have any leaves, snow, or ice cleared regularly. Use sand or salt on walking paths during winter. Clean up any spills in your garage right away. This includes oil or grease spills. What can I do in the bathroom? Use night lights. Install grab bars by the toilet and in the tub and shower. Do not use towel bars as grab bars. Use non-skid mats or decals in the tub or shower. If you need to sit down in the shower, use a plastic, non-slip stool. Keep the floor dry. Clean up any water that spills on the floor as soon as it happens. Remove soap buildup in the tub or shower regularly. Attach bath mats securely with double-sided non-slip rug tape. Do not have throw rugs and other things on the floor that can make you trip. What can I do in the  bedroom? Use night lights. Make sure that you have a light by your bed that is easy to reach. Do not use any sheets or blankets that are too big for your bed. They should not hang down onto the floor. Have a firm chair that has side arms. You can use this for support while you get dressed. Do not have throw rugs and other things on the floor that can make you trip. What can I do in the kitchen? Clean up any spills right away. Avoid walking on wet floors. Keep items that you use a lot in easy-to-reach places. If you need to reach something above you, use a strong step stool that has a grab bar. Keep electrical cords out of the way. Do not use floor polish or wax that makes floors slippery. If you must use wax, use non-skid floor wax. Do not have throw rugs and other things on the floor that can make you trip. What can I do with my stairs? Do not leave any items on the stairs. Make sure that there are handrails on both sides of the stairs and use them. Fix handrails that are broken or loose. Make sure that handrails are  as long as the stairways. Check any carpeting to make sure that it is firmly attached to the stairs. Fix any carpet that is loose or worn. Avoid having throw rugs at the top or bottom of the stairs. If you do have throw rugs, attach them to the floor with carpet tape. Make sure that you have a light switch at the top of the stairs and the bottom of the stairs. If you do not have them, ask someone to add them for you. What else can I do to help prevent falls? Wear shoes that: Do not have high heels. Have rubber bottoms. Are comfortable and fit you well. Are closed at the toe. Do not wear sandals. If you use a stepladder: Make sure that it is fully opened. Do not climb a closed stepladder. Make sure that both sides of the stepladder are locked into place. Ask someone to hold it for you, if possible. Clearly mark and make sure that you can see: Any grab bars or  handrails. First and last steps. Where the edge of each step is. Use tools that help you move around (mobility aids) if they are needed. These include: Canes. Walkers. Scooters. Crutches. Turn on the lights when you go into a dark area. Replace any light bulbs as soon as they burn out. Set up your furniture so you have a clear path. Avoid moving your furniture around. If any of your floors are uneven, fix them. If there are any pets around you, be aware of where they are. Review your medicines with your doctor. Some medicines can make you feel dizzy. This can increase your chance of falling. Ask your doctor what other things that you can do to help prevent falls. This information is not intended to replace advice given to you by your health care provider. Make sure you discuss any questions you have with your health care provider. Document Released: 06/03/2009 Document Revised: 01/13/2016 Document Reviewed: 09/11/2014 Elsevier Interactive Patient Education  2017 [employer].

## 2023-10-31 NOTE — Progress Notes (Unsigned)
 Marland Kitchen

## 2023-11-01 DIAGNOSIS — Z471 Aftercare following joint replacement surgery: Secondary | ICD-10-CM | POA: Diagnosis not present

## 2023-11-01 DIAGNOSIS — Z96651 Presence of right artificial knee joint: Secondary | ICD-10-CM | POA: Diagnosis not present

## 2023-11-06 DIAGNOSIS — H01002 Unspecified blepharitis right lower eyelid: Secondary | ICD-10-CM | POA: Diagnosis not present

## 2023-11-06 DIAGNOSIS — H524 Presbyopia: Secondary | ICD-10-CM | POA: Diagnosis not present

## 2023-11-06 DIAGNOSIS — H04123 Dry eye syndrome of bilateral lacrimal glands: Secondary | ICD-10-CM | POA: Diagnosis not present

## 2023-11-06 DIAGNOSIS — Z961 Presence of intraocular lens: Secondary | ICD-10-CM | POA: Diagnosis not present

## 2023-11-08 ENCOUNTER — Other Ambulatory Visit: Payer: Self-pay

## 2023-11-08 ENCOUNTER — Encounter: Payer: Self-pay | Admitting: Rehabilitative and Restorative Service Providers"

## 2023-11-08 ENCOUNTER — Ambulatory Visit: Attending: Student | Admitting: Rehabilitative and Restorative Service Providers"

## 2023-11-08 DIAGNOSIS — Z96651 Presence of right artificial knee joint: Secondary | ICD-10-CM | POA: Diagnosis not present

## 2023-11-08 DIAGNOSIS — M7651 Patellar tendinitis, right knee: Secondary | ICD-10-CM | POA: Diagnosis not present

## 2023-11-08 DIAGNOSIS — M25661 Stiffness of right knee, not elsewhere classified: Secondary | ICD-10-CM | POA: Insufficient documentation

## 2023-11-08 DIAGNOSIS — R262 Difficulty in walking, not elsewhere classified: Secondary | ICD-10-CM | POA: Insufficient documentation

## 2023-11-08 DIAGNOSIS — M25561 Pain in right knee: Secondary | ICD-10-CM

## 2023-11-08 DIAGNOSIS — M6281 Muscle weakness (generalized): Secondary | ICD-10-CM | POA: Diagnosis not present
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
## 2023-11-13 ENCOUNTER — Ambulatory Visit: Admitting: Physical Therapy

## 2023-11-13 ENCOUNTER — Encounter: Payer: Self-pay | Admitting: Physical Therapy

## 2023-11-13 DIAGNOSIS — R262 Difficulty in walking, not elsewhere classified: Secondary | ICD-10-CM | POA: Diagnosis not present

## 2023-11-13 DIAGNOSIS — M25661 Stiffness of right knee, not elsewhere classified: Secondary | ICD-10-CM | POA: Diagnosis not present

## 2023-11-13 DIAGNOSIS — M6281 Muscle weakness (generalized): Secondary | ICD-10-CM | POA: Diagnosis not present

## 2023-11-13 DIAGNOSIS — M25561 Pain in right knee: Secondary | ICD-10-CM

## 2023-11-13 DIAGNOSIS — Z96651 Presence of right artificial knee joint: Secondary | ICD-10-CM | POA: Diagnosis not present

## 2023-11-13 DIAGNOSIS — M7651 Patellar tendinitis, right knee: Secondary | ICD-10-CM | POA: Diagnosis not present
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
## 2023-11-15 ENCOUNTER — Ambulatory Visit

## 2023-11-15 DIAGNOSIS — M25661 Stiffness of right knee, not elsewhere classified: Secondary | ICD-10-CM | POA: Diagnosis not present

## 2023-11-15 DIAGNOSIS — M25561 Pain in right knee: Secondary | ICD-10-CM

## 2023-11-15 DIAGNOSIS — R262 Difficulty in walking, not elsewhere classified: Secondary | ICD-10-CM

## 2023-11-15 DIAGNOSIS — M6281 Muscle weakness (generalized): Secondary | ICD-10-CM | POA: Diagnosis not present

## 2023-11-15 DIAGNOSIS — Z96651 Presence of right artificial knee joint: Secondary | ICD-10-CM | POA: Diagnosis not present

## 2023-11-15 DIAGNOSIS — M7651 Patellar tendinitis, right knee: Secondary | ICD-10-CM | POA: Diagnosis not present
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
## 2023-11-20 ENCOUNTER — Ambulatory Visit: Attending: Student | Admitting: Rehabilitative and Restorative Service Providers"

## 2023-11-20 ENCOUNTER — Encounter: Payer: Self-pay | Admitting: Rehabilitative and Restorative Service Providers"

## 2023-11-20 DIAGNOSIS — R262 Difficulty in walking, not elsewhere classified: Secondary | ICD-10-CM | POA: Diagnosis not present

## 2023-11-20 DIAGNOSIS — M25661 Stiffness of right knee, not elsewhere classified: Secondary | ICD-10-CM

## 2023-11-20 DIAGNOSIS — M6281 Muscle weakness (generalized): Secondary | ICD-10-CM | POA: Diagnosis not present

## 2023-11-20 DIAGNOSIS — M25561 Pain in right knee: Secondary | ICD-10-CM
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
## 2023-11-21 ENCOUNTER — Ambulatory Visit

## 2023-11-21 ENCOUNTER — Encounter: Payer: Self-pay | Admitting: Family Medicine

## 2023-11-21 DIAGNOSIS — Z13820 Encounter for screening for osteoporosis: Secondary | ICD-10-CM

## 2023-11-21 DIAGNOSIS — Z87891 Personal history of nicotine dependence: Secondary | ICD-10-CM

## 2023-11-21 DIAGNOSIS — Z780 Asymptomatic menopausal state: Secondary | ICD-10-CM | POA: Diagnosis not present

## 2023-11-21 DIAGNOSIS — E2839 Other primary ovarian failure: Secondary | ICD-10-CM

## 2023-11-21 DIAGNOSIS — Z122 Encounter for screening for malignant neoplasm of respiratory organs: Secondary | ICD-10-CM

## 2023-11-22 ENCOUNTER — Ambulatory Visit

## 2023-11-22 DIAGNOSIS — M25661 Stiffness of right knee, not elsewhere classified: Secondary | ICD-10-CM | POA: Diagnosis not present

## 2023-11-22 DIAGNOSIS — R262 Difficulty in walking, not elsewhere classified: Secondary | ICD-10-CM | POA: Diagnosis not present

## 2023-11-22 DIAGNOSIS — M6281 Muscle weakness (generalized): Secondary | ICD-10-CM | POA: Diagnosis not present

## 2023-11-22 DIAGNOSIS — M25561 Pain in right knee: Secondary | ICD-10-CM | POA: Diagnosis not present
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
## 2023-11-23 ENCOUNTER — Other Ambulatory Visit: Payer: Self-pay | Admitting: Family Medicine

## 2023-11-23 NOTE — Telephone Encounter (Deleted)
 Copied from CRM [id number]. Topic: Clinical - Medication Refill >> Nov 23, 2023 11:14 AM Payton Doughty wrote: Most Recent Primary Care Visit:  Provider: Juel Burrow  Department: [facility]  Visit Type: ANNUAL WELL VISIT, SEQUENTIAL  Date: 10/31/2023  Medication: budesonide-formoterol (SYMBICORT) 80-4.5 MCG/ACT inhaler  Last done by Dr Lucious Groves  Is this the correct pharmacy for this prescription? Yes If no, delete pharmacy and type the correct one.  This is the patient's preferred pharmacy:  [id number] - [place], [place] - [address] AT [facility] [address] [place] [address] Phone: [phone] Fax: [phone]   Has the prescription been filled recently? Yes  Is the patient out of the medication? Yes Please send assap, as pt is out  Has the patient been seen for an appointment in the last year OR does the patient have an upcoming appointment? No  Can we respond through MyChart? Yes  Agent: Please be advised that Rx refills may take up to 3 business days. We ask that you follow-up with your pharmacy.

## 2023-11-26 ENCOUNTER — Telehealth: Payer: Self-pay

## 2023-11-26 NOTE — Telephone Encounter (Signed)
 Copied from CRM [id number]. Topic: Clinical - Medication Refill >> Nov 23, 2023 11:14 AM Payton Doughty wrote: Most Recent Primary Care Visit:  Provider: Juel Burrow  Department: [facility]  Visit Type: ANNUAL WELL VISIT, SEQUENTIAL  Date: 10/31/2023  Medication: budesonide-formoterol (SYMBICORT) 80-4.5 MCG/ACT inhaler  Last done by Dr Lucious Groves Is this the correct pharmacy for this prescription? Yes If no, delete pharmacy and type the correct one.  This is the patient's preferred pharmacy:  [id number] - [place], [place] - [address] AT [facility] [address] [place] [address] Phone: [phone] Fax: [phone] Has the prescription been filled recently? Yes Is the patient out of the medication? Yes Has the patient been seen for an appointment in the last year OR does the patient have an upcoming appointment? No Can we respond through MyChart? Yes Agent: Please be advised that Rx refills may take up to 3 business days. We ask that you follow-up with your pharmacy.  Symbicort inhaler was managed by Dr. Craige Cotta and patient is needing more refills. Please advise Dr. Wynona Neat if ok to send in new sybmicort prescription

## 2023-11-27 ENCOUNTER — Encounter: Payer: Self-pay | Admitting: Pulmonary Disease

## 2023-11-27 ENCOUNTER — Other Ambulatory Visit: Payer: Self-pay | Admitting: Pulmonary Disease

## 2023-11-27 ENCOUNTER — Ambulatory Visit: Admitting: Physical Therapy

## 2023-11-27 ENCOUNTER — Encounter: Payer: Self-pay | Admitting: Physical Therapy

## 2023-11-27 DIAGNOSIS — M6281 Muscle weakness (generalized): Secondary | ICD-10-CM

## 2023-11-27 DIAGNOSIS — M25561 Pain in right knee: Secondary | ICD-10-CM

## 2023-11-27 DIAGNOSIS — R262 Difficulty in walking, not elsewhere classified: Secondary | ICD-10-CM

## 2023-11-27 DIAGNOSIS — M25661 Stiffness of right knee, not elsewhere classified: Secondary | ICD-10-CM | POA: Diagnosis not present

## 2023-11-27 MED ORDER — BUDESONIDE-FORMOTEROL FUMARATE 160-4.5 MCG/ACT IN AERO: 2.0000 | INHALATION_SPRAY | Freq: Two times a day (BID) | RESPIRATORY_TRACT | 6 refills | Status: DC
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
## 2023-11-29 ENCOUNTER — Encounter

## 2023-11-29 DIAGNOSIS — Z96651 Presence of right artificial knee joint: Secondary | ICD-10-CM | POA: Diagnosis not present

## 2023-12-04 ENCOUNTER — Ambulatory Visit: Admitting: Physical Therapy

## 2023-12-06 ENCOUNTER — Encounter

## 2023-12-11 ENCOUNTER — Encounter

## 2023-12-13 ENCOUNTER — Encounter

## 2023-12-17 ENCOUNTER — Ambulatory Visit: Admitting: Cardiology

## 2023-12-18 ENCOUNTER — Encounter: Admitting: Physical Therapy

## 2023-12-20 ENCOUNTER — Encounter

## 2023-12-21 ENCOUNTER — Encounter: Payer: Self-pay | Admitting: Family Medicine

## 2023-12-23 ENCOUNTER — Other Ambulatory Visit: Payer: Self-pay | Admitting: Pulmonary Disease

## 2023-12-23 ENCOUNTER — Encounter: Payer: Self-pay | Admitting: Pulmonary Disease

## 2023-12-23 DIAGNOSIS — I2729 Other secondary pulmonary hypertension: Secondary | ICD-10-CM

## 2023-12-23 NOTE — Progress Notes (Signed)
 Order placed for echocardiogram -enlarged pulmonary trunk on CT

## 2023-12-25 NOTE — Telephone Encounter (Signed)
 error

## 2024-01-01 ENCOUNTER — Other Ambulatory Visit: Payer: Self-pay | Admitting: Family Medicine

## 2024-01-01 DIAGNOSIS — G6289 Other specified polyneuropathies: Secondary | ICD-10-CM

## 2024-01-21 ENCOUNTER — Ambulatory Visit (HOSPITAL_BASED_OUTPATIENT_CLINIC_OR_DEPARTMENT_OTHER)
Admission: RE | Admit: 2024-01-21 | Discharge: 2024-01-21 | Disposition: A | Discharge status: Home / Self Care | Source: Ambulatory Visit | Attending: Pulmonary Disease | Admitting: Pulmonary Disease

## 2024-01-21 DIAGNOSIS — I2729 Other secondary pulmonary hypertension: Secondary | ICD-10-CM | POA: Diagnosis not present

## 2024-01-21 LAB — ECHOCARDIOGRAM COMPLETE
AR max vel: 1.85 cm2
AV Area VTI: 1.74 cm2
AV Area mean vel: 2.02 cm2
AV Mean grad: 8 mmHg
AV Peak grad: 15.2 mmHg
Ao pk vel: 1.95 m/s
Area-P 1/2: 2.68 cm2
Calc EF: 62.8 %
S' Lateral: 2.5 cm
Single Plane A2C EF: 61.7 %
Single Plane A4C EF: 65.2 %

## 2024-01-29 ENCOUNTER — Ambulatory Visit: Admitting: Cardiology

## 2024-02-05 ENCOUNTER — Other Ambulatory Visit: Payer: Self-pay | Admitting: Neurological Surgery

## 2024-02-05 DIAGNOSIS — M5414 Radiculopathy, thoracic region: Secondary | ICD-10-CM | POA: Diagnosis not present

## 2024-02-05 DIAGNOSIS — M5416 Radiculopathy, lumbar region: Secondary | ICD-10-CM | POA: Diagnosis not present

## 2024-02-05 DIAGNOSIS — Z6841 Body Mass Index (BMI) 40.0 and over, adult: Secondary | ICD-10-CM | POA: Diagnosis not present

## 2024-02-10 ENCOUNTER — Ambulatory Visit (INDEPENDENT_AMBULATORY_CARE_PROVIDER_SITE_OTHER)

## 2024-02-10 DIAGNOSIS — M5416 Radiculopathy, lumbar region: Secondary | ICD-10-CM | POA: Diagnosis not present

## 2024-02-10 DIAGNOSIS — M5414 Radiculopathy, thoracic region: Secondary | ICD-10-CM

## 2024-02-11 ENCOUNTER — Other Ambulatory Visit: Payer: Self-pay | Admitting: Family Medicine

## 2024-02-11 DIAGNOSIS — B372 Candidiasis of skin and nail: Secondary | ICD-10-CM

## 2024-02-22 NOTE — Patient Instructions (Incomplete)
 It was great to see you again today, I will be in touch with your urine culture Let me know how it goes with adding bupropion  to your regimen  Use the diflucan  pill once weekly as needed for yeast infection that is not responding to your powder
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
## 2024-02-25 ENCOUNTER — Ambulatory Visit (INDEPENDENT_AMBULATORY_CARE_PROVIDER_SITE_OTHER): Admitting: Family Medicine

## 2024-02-25 VITALS — BP 122/72 | HR 73 | Ht 66.0 in | Wt 303.0 lb

## 2024-02-25 DIAGNOSIS — N898 Other specified noninflammatory disorders of vagina: Secondary | ICD-10-CM | POA: Diagnosis not present

## 2024-02-25 DIAGNOSIS — G6289 Other specified polyneuropathies: Secondary | ICD-10-CM | POA: Diagnosis not present

## 2024-02-25 DIAGNOSIS — R300 Dysuria: Secondary | ICD-10-CM | POA: Diagnosis not present

## 2024-02-25 DIAGNOSIS — F341 Dysthymic disorder: Secondary | ICD-10-CM

## 2024-02-25 MED ORDER — FLUCONAZOLE 150 MG PO TABS: 150.0000 mg | ORAL_TABLET | Freq: Once | ORAL | 1 refills | Status: AC

## 2024-02-25 MED ORDER — PREGABALIN 100 MG PO CAPS: ORAL_CAPSULE | ORAL | 1 refills | Status: AC

## 2024-02-25 MED ORDER — BUPROPION HCL ER (XL) 150 MG PO TB24: 150.0000 mg | ORAL_TABLET | Freq: Every day | ORAL | 3 refills | Status: AC

## 2024-02-25 MED ORDER — VENLAFAXINE HCL 50 MG PO TABS: 50.0000 mg | ORAL_TABLET | Freq: Two times a day (BID) | ORAL | 3 refills | Status: DC

## 2024-02-27 ENCOUNTER — Encounter: Payer: Self-pay | Admitting: Family Medicine

## 2024-02-27 LAB — URINE CULTURE
MICRO NUMBER:: 16665038
SPECIMEN QUALITY:: ADEQUATE

## 2024-02-27 MED ORDER — CEPHALEXIN 500 MG PO CAPS: 500.0000 mg | ORAL_CAPSULE | Freq: Two times a day (BID) | ORAL | 0 refills | Status: DC

## 2024-02-27 NOTE — Addendum Note (Signed)
 Addended by: WATT RAISIN C on: 02/27/2024 04:22 PM   Modules accepted: Orders

## 2024-03-13 DIAGNOSIS — M4316 Spondylolisthesis, lumbar region: Secondary | ICD-10-CM | POA: Diagnosis not present

## 2024-03-17 ENCOUNTER — Telehealth: Payer: Self-pay | Admitting: Pulmonary Disease

## 2024-03-17 NOTE — Telephone Encounter (Signed)
 Will await form

## 2024-03-17 NOTE — Telephone Encounter (Signed)
 Copied from CRM [id number]. Topic: General - Other >> Mar 17, 2024  1:21 PM Corean SAUNDERS wrote: Reason for CRM: Patient states she is having back surgery and her doctor will be faxing a surgical clearance request for Dr. Neda.

## 2024-03-19 ENCOUNTER — Telehealth: Payer: Self-pay | Admitting: Family Medicine

## 2024-03-19 NOTE — Telephone Encounter (Unsigned)
 Copied from CRM [id number]. Topic: Clinical - Medication Refill >> Mar 19, 2024 11:45 AM Rosina BIRCH wrote: Medication: nystop - yeast on her skin medication that she has been taken for awhile  Has the patient contacted their pharmacy? Yes (Agent: If no, request that the patient contact the pharmacy for the refill. If patient does not wish to contact the pharmacy document the reason why and proceed with request.) (Agent: If yes, when and what did the pharmacy advise?)  This is the patient's preferred pharmacy:  [id number] - [place], [place] - [address] AT [facility] [address] [place]  [address] Phone: [phone] Fax: [phone]  Is this the correct pharmacy for this prescription? Yes If no, delete pharmacy and type the correct one.   Has the prescription been filled recently? No  Is the patient out of the medication? Yes  Has the patient been seen for an appointment in the last year OR does the patient have an upcoming appointment? Yes  Can we respond through MyChart? Yes  Agent: Please be advised that Rx refills may take up to 3 business days. We ask that you follow-up with your pharmacy.

## 2024-03-23 ENCOUNTER — Other Ambulatory Visit: Payer: Self-pay | Admitting: Family Medicine

## 2024-03-23 DIAGNOSIS — G6289 Other specified polyneuropathies: Secondary | ICD-10-CM

## 2024-03-24 ENCOUNTER — Other Ambulatory Visit: Payer: Self-pay | Admitting: Family Medicine

## 2024-03-24 ENCOUNTER — Encounter: Payer: Self-pay | Admitting: Family Medicine

## 2024-03-24 DIAGNOSIS — E782 Mixed hyperlipidemia: Secondary | ICD-10-CM

## 2024-03-24 MED ORDER — NYSTATIN 100000 UNIT/GM EX POWD: 1.0000 | Freq: Three times a day (TID) | CUTANEOUS | 1 refills | Status: AC

## 2024-03-27 NOTE — Telephone Encounter (Signed)
 I still have not received any request  Called and spoke with the pt  She says it should have come from [facility]- Dr. Alm Molt  I called their office and asked them to fax to me- spoke with Shanda Will await form  Note that the pt has ov with Dr. Neda 05/08/24

## 2024-04-01 NOTE — Telephone Encounter (Signed)
 Fax received from Dr. Alm Molt with [facility] to perform a L3-4, L4-5 Lumbar fusion on patient.  Patient needs surgery clearance. Surgery is pending. Patient was seen on 06/13/23. Office protocol is a risk assessment can be sent to surgeon if patient has been seen in 60 days or less.   Pt is scheduled for risk assessment with Dr. Neda on 05/08/24. Fax sent to [facility] informing them of this appt. Will hold in the clearance pool for now.

## 2024-04-02 ENCOUNTER — Ambulatory Visit: Attending: Cardiology | Admitting: Cardiology

## 2024-04-02 ENCOUNTER — Encounter: Payer: Self-pay | Admitting: Cardiology

## 2024-04-02 VITALS — BP 143/77 | HR 79 | Ht 66.0 in | Wt 298.0 lb

## 2024-04-02 DIAGNOSIS — I10 Essential (primary) hypertension: Secondary | ICD-10-CM | POA: Diagnosis not present

## 2024-04-02 DIAGNOSIS — R0609 Other forms of dyspnea: Secondary | ICD-10-CM | POA: Insufficient documentation

## 2024-04-02 DIAGNOSIS — C541 Malignant neoplasm of endometrium: Secondary | ICD-10-CM | POA: Diagnosis not present

## 2024-04-02 DIAGNOSIS — I4510 Unspecified right bundle-branch block: Secondary | ICD-10-CM | POA: Diagnosis present

## 2024-04-02 DIAGNOSIS — R931 Abnormal findings on diagnostic imaging of heart and coronary circulation: Secondary | ICD-10-CM | POA: Insufficient documentation

## 2024-04-02 DIAGNOSIS — I719 Aortic aneurysm of unspecified site, without rupture: Secondary | ICD-10-CM

## 2024-04-02 DIAGNOSIS — E782 Mixed hyperlipidemia: Secondary | ICD-10-CM | POA: Diagnosis present

## 2024-04-02 MED ORDER — ASPIRIN 81 MG PO TBEC: 81.0000 mg | DELAYED_RELEASE_TABLET | Freq: Every day | ORAL | Status: AC

## 2024-04-02 MED ORDER — EZETIMIBE 10 MG PO TABS: 10.0000 mg | ORAL_TABLET | Freq: Every day | ORAL | 3 refills | Status: AC
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
## 2024-04-02 NOTE — Patient Instructions (Addendum)
 Medication Instructions:   START: Zetia  10mg  1 tablet daily  START: Aspirin  81mg  1 tablet daily   Lab Work: 3rd Floor   [address]  Your physician recommends that you return for lab work in:   6 weeks You need to have labs done when you are fasting.  You can come Monday through Friday 8:00 am to 11:30AM and 1:00 to 4:00. You do not need to make an appointment as the order has already been placed.    Testing/Procedures: Your physician has requested that you have a lexiscan  myoview . For further information please visit [email or web address] Please follow instruction sheet, as given.  The test will take approximately 3 to 4 hours to complete; you may bring reading material.  If someone comes with you to your appointment, they will need to remain in the main lobby due to limited space in the testing area. **If you are pregnant or breastfeeding, please notify the nuclear lab prior to your appointment**  How to prepare for your Myocardial Perfusion Test: Do not eat or drink 3 hours prior to your test, except you may have water . Do not consume products containing caffeine (regular or decaffeinated) 12 hours prior to your test. (ex: coffee, chocolate, sodas, tea). Do bring a list of your current medications with you.  If not listed below, you may take your medications as normal. Do wear comfortable clothes (no dresses or overalls) and walking shoes, tennis shoes preferred (No heels or open toe shoes are allowed). Do NOT wear cologne, perfume, aftershave, or lotions (deodorant is allowed). If these instructions are not followed, your test will have to be rescheduled.     Follow-Up: At [facility], you and your health needs are our priority.  As part of our continuing mission to provide you with exceptional heart care, we have created designated Provider Care Teams.  These Care Teams include your primary Cardiologist (physician) and Advanced Practice Providers (APPs -  Physician Assistants and  Nurse Practitioners) who all work together to provide you with the care you need, when you need it.  We recommend signing up for the patient portal called MyChart.  Sign up information is provided on this After Visit Summary.  MyChart is used to connect with patients for Virtual Visits (Telemedicine).  Patients are able to view lab/test results, encounter notes, upcoming appointments, etc.  Non-urgent messages can be sent to your provider as well.   To learn more about what you can do with MyChart, go to [email or web address].    Your next appointment:   3 month(s)  The format for your next appointment:   In Person  Provider:   Lamar Fitch, MD    Other Instructions NA

## 2024-04-03 ENCOUNTER — Encounter (HOSPITAL_COMMUNITY): Payer: Self-pay | Admitting: *Deleted

## 2024-04-03 DIAGNOSIS — M7651 Patellar tendinitis, right knee: Secondary | ICD-10-CM | POA: Diagnosis not present

## 2024-04-03 DIAGNOSIS — Z96651 Presence of right artificial knee joint: Secondary | ICD-10-CM | POA: Diagnosis not present

## 2024-04-10 ENCOUNTER — Other Ambulatory Visit: Payer: Self-pay | Admitting: Family Medicine

## 2024-04-10 DIAGNOSIS — E039 Hypothyroidism, unspecified: Secondary | ICD-10-CM

## 2024-04-16 ENCOUNTER — Other Ambulatory Visit: Payer: Self-pay | Admitting: Cardiology

## 2024-04-16 DIAGNOSIS — R0609 Other forms of dyspnea: Secondary | ICD-10-CM

## 2024-04-17 ENCOUNTER — Ambulatory Visit (HOSPITAL_COMMUNITY)
Admission: RE | Admit: 2024-04-17 | Discharge: 2024-04-17 | Disposition: A | Discharge status: Home / Self Care | Source: Ambulatory Visit | Attending: Cardiology | Admitting: Cardiology

## 2024-04-17 DIAGNOSIS — R0609 Other forms of dyspnea: Secondary | ICD-10-CM | POA: Insufficient documentation

## 2024-04-17 LAB — MYOCARDIAL PERFUSION IMAGING
LV dias vol: 92 mL (ref 46–106)
LV sys vol: 39 mL (ref 3.8–5.2)
Nuc Stress EF: 58 %
Peak HR: 73 {beats}/min
Rest HR: 56 {beats}/min
Rest Nuclear Isotope Dose: 12.2 mCi
SDS: 0
SRS: 1
SSS: 0
ST Depression (mm): 0 mm
Stress Nuclear Isotope Dose: 36.4 mCi
TID: 0.96

## 2024-04-17 MED ORDER — TECHNETIUM TC 99M TETROFOSMIN IV KIT
36.4000 | PACK | Freq: Once | INTRAVENOUS | Status: AC | PRN
  Administered 2024-04-17: 36.4 via INTRAVENOUS

## 2024-04-17 MED ORDER — REGADENOSON 0.4 MG/5ML IV SOLN
INTRAVENOUS | Status: AC
  Filled 2024-04-17: qty 5

## 2024-04-17 MED ORDER — REGADENOSON 0.4 MG/5ML IV SOLN
0.4000 mg | Freq: Once | INTRAVENOUS | Status: AC
  Administered 2024-04-17: 0.4 mg via INTRAVENOUS

## 2024-04-17 MED ORDER — TECHNETIUM TC 99M TETROFOSMIN IV KIT
12.2000 | PACK | Freq: Once | INTRAVENOUS | Status: AC | PRN
  Administered 2024-04-17: 12.2 via INTRAVENOUS

## 2024-04-18 ENCOUNTER — Ambulatory Visit
Admission: RE | Admit: 2024-04-18 | Discharge: 2024-04-18 | Disposition: A | Discharge status: Home / Self Care | Attending: Family Medicine | Admitting: Family Medicine

## 2024-04-18 ENCOUNTER — Ambulatory Visit: Payer: Self-pay | Admitting: Cardiology

## 2024-04-18 ENCOUNTER — Telehealth: Payer: Self-pay

## 2024-04-18 ENCOUNTER — Other Ambulatory Visit: Payer: Self-pay

## 2024-04-18 VITALS — BP 148/78 | HR 97 | Temp 97.7°F | Resp 22

## 2024-04-18 DIAGNOSIS — R300 Dysuria: Secondary | ICD-10-CM | POA: Diagnosis not present

## 2024-04-18 DIAGNOSIS — N3000 Acute cystitis without hematuria: Secondary | ICD-10-CM

## 2024-04-18 LAB — POCT URINALYSIS DIP (MANUAL ENTRY)
Bilirubin, UA: NEGATIVE
Blood, UA: NEGATIVE
Glucose, UA: NEGATIVE mg/dL
Nitrite, UA: POSITIVE — AB
Protein Ur, POC: 100 mg/dL — AB
Spec Grav, UA: 1.025 (ref 1.010–1.025)
Urobilinogen, UA: 0.2 U/dL
pH, UA: 5.5 (ref 5.0–8.0)

## 2024-04-18 MED ORDER — CEPHALEXIN 500 MG PO CAPS: 500.0000 mg | ORAL_CAPSULE | Freq: Two times a day (BID) | ORAL | 0 refills | Status: DC

## 2024-04-18 NOTE — Telephone Encounter (Signed)
 Left message on My Chart with stress test results per Dr. Tonja Fray note. Routed to PCP.
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
## 2024-04-18 NOTE — ED Triage Notes (Signed)
 Thinks she has a uti, has had urinary frequency and bladder cramping at 0400 this morning. No fever. No otc meds.

## 2024-04-18 NOTE — Discharge Instructions (Signed)
 Increase fluid intake. May use non-prescription AZO for about two days, if desired, to decrease urinary discomfort.  If symptoms become significantly worse during the night or over the weekend, proceed to the local emergency room.

## 2024-04-19 ENCOUNTER — Telehealth: Payer: Self-pay

## 2024-04-19 NOTE — Telephone Encounter (Signed)
Called to check on patient. Left voicemail.

## 2024-04-20 LAB — URINE CULTURE
Culture: >=100000 — AB
Special Requests: NORMAL

## 2024-04-22 ENCOUNTER — Ambulatory Visit (HOSPITAL_COMMUNITY): Payer: Self-pay

## 2024-04-23 ENCOUNTER — Telehealth: Payer: Self-pay

## 2024-04-23 NOTE — Telephone Encounter (Signed)
LVM per DPR- per Dr. Vanetta Shawl note regarding normal Stress Test results. Encouraged to call with any questions. Routed to PCP.

## 2024-04-28 ENCOUNTER — Other Ambulatory Visit: Payer: Self-pay | Admitting: Pulmonary Disease

## 2024-04-28 MED ORDER — BUDESONIDE-FORMOTEROL FUMARATE 160-4.5 MCG/ACT IN AERO: 2.0000 | INHALATION_SPRAY | Freq: Two times a day (BID) | RESPIRATORY_TRACT | 6 refills | Status: AC

## 2024-04-28 NOTE — Telephone Encounter (Signed)
 Copied from CRM [id number]. Topic: Clinical - Medication Refill >> Apr 28, 2024 10:45 AM Isabell A wrote: Medication: budesonide -formoterol  (BREYNA ) 160-4.5 MCG/ACT inhaler   Has the patient contacted their pharmacy? Yes (Agent: If no, request that the patient contact the pharmacy for the refill. If patient does not wish to contact the pharmacy document the reason why and proceed with request.) (Agent: If yes, when and what did the pharmacy advise?)  This is the patient's preferred pharmacy:  [id number] - [place], [place] - [address] AT [facility] [address] [place] [address] Phone: [phone] Fax: [phone]  Is this the correct pharmacy for this prescription? Yes If no, delete pharmacy and type the correct one.   Has the prescription been filled recently? Yes  Is the patient out of the medication? Yes  Has the patient been seen for an appointment in the last year OR does the patient have an upcoming appointment? Yes  Can we respond through MyChart? No  Agent: Please be advised that Rx refills may take up to 3 business days. We ask that you follow-up with your pharmacy.

## 2024-04-29 ENCOUNTER — Other Ambulatory Visit: Payer: Self-pay | Admitting: Family Medicine

## 2024-04-29 ENCOUNTER — Telehealth: Payer: Self-pay

## 2024-04-29 DIAGNOSIS — F5101 Primary insomnia: Secondary | ICD-10-CM

## 2024-04-29 NOTE — Telephone Encounter (Signed)
 Copied from CRM [id number]. Topic: Clinical - Prescription Issue >> Apr 29, 2024  3:29 PM Russell PARAS wrote: Reason for CRM:   Pt is contacting clinic regarding her refill request for Breyna . Advised was sent to [facility] on file on 9/8 and receipt was verified. Pt spoke with [facility] this morning and was told they had not received the refill order.   Requesting if refills can be resent to pharmacy, and for pt to be contacted when it has been resent.   CB# [phone]    Spoke w/ PT reach out to pharmacy Breyna  is no longer coved    Tried to reach back out to PT phone would pick up and hang up.

## 2024-04-30 ENCOUNTER — Other Ambulatory Visit (HOSPITAL_COMMUNITY): Payer: Self-pay

## 2024-04-30 NOTE — Telephone Encounter (Signed)
 Spoke w/ her pharmacy yesterday and when they tried to run it  wouldn't go through and the tech said it was not coved by insurance.   Tried to reach out to PT VM/LM  okay per [facility]

## 2024-05-01 ENCOUNTER — Telehealth: Payer: Self-pay | Admitting: Pulmonary Disease

## 2024-05-01 NOTE — Telephone Encounter (Signed)
 Copied from CRM [id number]. Topic: General - Other >> May 01, 2024  8:25 AM Russell PARAS wrote: Reason for CRM:   Pt is returning call from [place]. Contacted [facility], was advised by Thersia that no one is avail in triage this morning to take call.  Pt requested call back  CB#  [phone] >> May 01, 2024  3:09 PM Corean SAUNDERS wrote: Patient returning call to [facility] and requesting that she call her back in the morning.

## 2024-05-02 ENCOUNTER — Ambulatory Visit: Payer: Self-pay | Admitting: Cardiology

## 2024-05-02 NOTE — Telephone Encounter (Signed)
 Spoke w/ PT VBU.   -NFN

## 2024-05-02 NOTE — Telephone Encounter (Signed)
Routing to Kelly.  ?

## 2024-05-08 ENCOUNTER — Ambulatory Visit (INDEPENDENT_AMBULATORY_CARE_PROVIDER_SITE_OTHER): Admitting: Pulmonary Disease

## 2024-05-08 ENCOUNTER — Encounter: Payer: Self-pay | Admitting: Pulmonary Disease

## 2024-05-08 VITALS — BP 117/74 | HR 94 | Ht 66.0 in | Wt 304.6 lb

## 2024-05-08 DIAGNOSIS — J439 Emphysema, unspecified: Secondary | ICD-10-CM | POA: Diagnosis not present

## 2024-05-08 DIAGNOSIS — J849 Interstitial pulmonary disease, unspecified: Secondary | ICD-10-CM

## 2024-05-08 DIAGNOSIS — Z87891 Personal history of nicotine dependence: Secondary | ICD-10-CM

## 2024-05-08 DIAGNOSIS — Z01811 Encounter for preprocedural respiratory examination: Secondary | ICD-10-CM | POA: Diagnosis not present

## 2024-05-08 DIAGNOSIS — R0609 Other forms of dyspnea: Secondary | ICD-10-CM
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
## 2024-05-08 NOTE — Telephone Encounter (Signed)
 OV note has been faxed to [facility] Neuro surgery.  Received successful fax confirmation.  Nothing further needed.

## 2024-05-08 NOTE — Telephone Encounter (Signed)
-----   Message from Lamar Fitch sent at 05/02/2024 10:57 AM EDT ----- Stress test is negative ----- Message ----- From: Interface, Rad Results In Sent: 04/29/2024   8:17 PM EDT To: Lamar JINNY Fitch, MD

## 2024-05-08 NOTE — Patient Instructions (Addendum)
 Continue with your inhalers  Use albuterol  as needed  Graded activities as tolerated  I will get a breathing study on you on the next visit  Your ultrasound of the heart that was performed in June was normal  From a breathing perspective, you are cleared for surgery, there is always about moderate risk of respiratory complications but at present, you do not have any acutely reversible problems  Call us  with significant concerns  Follow-up in 6 months

## 2024-05-11 ENCOUNTER — Encounter (HOSPITAL_COMMUNITY): Payer: Self-pay

## 2024-05-11 ENCOUNTER — Emergency Department (HOSPITAL_COMMUNITY): Admission: EM | Admit: 2024-05-11 | Discharge: 2024-05-11 | Disposition: A | Discharge status: Home / Self Care

## 2024-05-11 ENCOUNTER — Other Ambulatory Visit: Payer: Self-pay

## 2024-05-11 DIAGNOSIS — M542 Cervicalgia: Secondary | ICD-10-CM | POA: Diagnosis not present

## 2024-05-11 DIAGNOSIS — Z7982 Long term (current) use of aspirin: Secondary | ICD-10-CM | POA: Insufficient documentation

## 2024-05-11 DIAGNOSIS — S161XXA Strain of muscle, fascia and tendon at neck level, initial encounter: Secondary | ICD-10-CM

## 2024-05-11 DIAGNOSIS — M545 Low back pain, unspecified: Secondary | ICD-10-CM | POA: Diagnosis not present

## 2024-05-11 MED ORDER — LIDOCAINE 5 % EX PTCH
2.0000 | MEDICATED_PATCH | CUTANEOUS | Status: DC
  Administered 2024-05-11: 2 via TRANSDERMAL
  Filled 2024-05-11: qty 2

## 2024-05-11 MED ORDER — METHOCARBAMOL 500 MG PO TABS: 500.0000 mg | ORAL_TABLET | Freq: Two times a day (BID) | ORAL | 0 refills | Status: AC | PRN

## 2024-05-11 MED ORDER — TRAMADOL HCL 50 MG PO TABS
50.0000 mg | ORAL_TABLET | Freq: Once | ORAL | Status: AC
  Administered 2024-05-11: 50 mg via ORAL
  Filled 2024-05-11: qty 1

## 2024-05-11 MED ORDER — METHOCARBAMOL 500 MG PO TABS
500.0000 mg | ORAL_TABLET | Freq: Once | ORAL | Status: AC
  Administered 2024-05-11: 500 mg via ORAL
  Filled 2024-05-11: qty 1

## 2024-05-11 NOTE — ED Triage Notes (Signed)
 Pt reports pain actually starting on waking yesterday morning and got progressively worse throughout the day.

## 2024-05-11 NOTE — ED Notes (Signed)
 Patient reports neck pain 2/10 at rest and 9/10 with twisting neck.

## 2024-05-11 NOTE — ED Triage Notes (Signed)
 Pt woke up this morning with sharp right sided neck pain. 10/10. Tramadol  taken at home with no relief. Denies any injury. 975mg  tylenol  given by EMS en route to ER. VSS. Denies fever. No other symptoms noted.

## 2024-05-11 NOTE — ED Triage Notes (Signed)
 Neck exercises performed last night per EMS.

## 2024-05-11 NOTE — Discharge Instructions (Addendum)
 As discussed continue to take Tylenol  every 6-8 hours with dosages as directed on the packaging for baseline pain control.  Continue to take your tramadol .  We are prescribing you muscle relaxer, please use caution as it does predispose your age group to falls and delirium.  Return if you develop fevers, chills, severe pain, weakness or numbness in your upper extremities, or you develop any new or worsening symptoms that are concerning to

## 2024-05-11 NOTE — ED Provider Notes (Signed)
 [place] EM[facility] DEPARTMENT AT [facility] Provider Note   CSN: [id number] Arrival date & time: 05/11/24  9491     Patient presents with: Neck Pain   Shelly Mosley is a 78 y.o. female.   78 year old female presenting emergency department with neck pain.  No trauma or inciting event that she can recall.  Woke up day before yesterday with the neck pain thought she slept on it wrong, pain has persisted.  Hurts with movement, but improved with her home tramadol  for which she takes for her low back pain.  Denies numbness in her upper extremities or weakness.  No fevers.  No immunosuppression.   Neck Pain      Prior to Admission medications   Medication Sig Start Date End Date Taking? Authorizing Provider  Artificial Tear Solution (SOOTHE XP) SOLN Place 1 drop into both eyes daily as needed (Dry eyes).    [provider]  aspirin  EC 81 MG tablet Take 1 tablet (81 mg total) by mouth daily. Swallow whole. 04/02/24   Krasowski, Robert J, MD  atorvastatin  (LIPITOR) 20 MG tablet TAKE 1 TABLET(20 MG) BY MOUTH DAILY Patient taking differently: Take 20 mg by mouth daily. TAKE 1 TABLET(20 MG) BY MOUTH DAILY 10/22/23   Copland, Harlene BROCKS, MD  benzonatate  (TESSALON ) 100 MG capsule Take 1 capsule (100 mg total) by mouth 3 (three) times daily as needed for cough. 10/22/23   Copland, Harlene BROCKS, MD  budesonide -formoterol  (BREYNA ) 160-4.5 MCG/ACT inhaler Inhale 2 puffs into the lungs 2 (two) times daily. 04/28/24   Neda Jennet LABOR, MD  buPROPion  (WELLBUTRIN  XL) 150 MG 24 hr tablet Take 1 tablet (150 mg total) by mouth daily. 02/25/24   Copland, Harlene BROCKS, MD  cephALEXin  (KEFLEX ) 500 MG capsule Take 1 capsule (500 mg total) by mouth 2 (two) times daily. 04/18/24   Pauline Garnette LABOR, MD  cetirizine (ZYRTEC) 10 MG tablet Take 10 mg by mouth every evening.    [provider]  ezetimibe  (ZETIA ) 10 MG tablet Take 1 tablet (10 mg total) by mouth daily. 04/02/24   Krasowski, Robert J, MD   furosemide  (LASIX ) 80 MG tablet TAKE 1 TABLET(80 MG) BY MOUTH DAILY Patient taking differently: Take 80 mg by mouth daily. TAKE 1 TABLET(80 MG) BY MOUTH DAILY 10/22/23   Copland, Harlene BROCKS, MD  levothyroxine  (SYNTHROID ) 150 MCG tablet Take 1 tablet (150 mcg total) by mouth daily before breakfast. 04/11/24   Copland, Jessica C, MD  losartan  (COZAAR ) 50 MG tablet TAKE 1 TABLET(50 MG) BY MOUTH TWICE DAILY Patient taking differently: Take 50 mg by mouth daily. TAKE 1 TABLET(50 MG) BY MOUTH TWICE DAILY 10/22/23   Copland, Jessica C, MD  nystatin  (MYCOSTATIN /NYSTOP ) powder Apply 1 Application topically 3 (three) times daily. 03/24/24   Copland, Jessica C, MD  Polyethyl Glycol-Propyl Glycol (SYSTANE) 0.4-0.3 % GEL ophthalmic gel Place 1 Application into both eyes at bedtime.    [provider]  pregabalin  (LYRICA ) 100 MG capsule TAKE 1 CAPSULE(100 MG) BY MOUTH THREE TIMES DAILY 02/25/24   Copland, Harlene BROCKS, MD  traMADol  (ULTRAM ) 50 MG tablet Take 1-2 tablets (50-100 mg total) by mouth every 8 (eight) hours as needed. 03/24/24   Copland, Harlene BROCKS, MD  venlafaxine  (EFFEXOR ) 50 MG tablet Take 1 tablet (50 mg total) by mouth 2 (two) times daily with a meal. TAKE 1 TABLET(50 MG) BY MOUTH TWICE DAILY WITH A MEAL 02/25/24   Copland, Harlene BROCKS, MD    Allergies: Chloroxylenol (antiseptic),  Ciprofloxacin  hcl, Codeine, Advil [ibuprofen], Nsaids, Statins, Sulfa antibiotics, and Tolmetin    Review of Systems  Musculoskeletal:  Positive for neck pain.    Updated Vital Signs BP 124/79 (BP Location: Right Arm)   Pulse 69   Temp 97.8 F (36.6 C) (Oral)   Resp 16   Ht 5' 6.5 (1.689 m)   Wt 136.1 kg   LMP 11/20/2011 (Approximate)   SpO2 92%   BMI 47.70 kg/m   Physical Exam Vitals and nursing note reviewed.  HENT:     Nose: Nose normal.     Mouth/Throat:     Mouth: Mucous membranes are moist.  Eyes:     Conjunctiva/sclera: Conjunctivae normal.  Neck:     Comments: No midline spinal tenderness.   Tenderness to the right pericervical musculature.  Decreased ROM secondary to pain Cardiovascular:     Rate and Rhythm: Normal rate and regular rhythm.  Pulmonary:     Effort: Pulmonary effort is normal.  Abdominal:     General: Abdomen is flat.  Musculoskeletal:     Comments: 5-5 bicep strength, tricep strength, normal sensation in hands.  2+ radial pulses  Skin:    General: Skin is warm.     Capillary Refill: Capillary refill takes less than 2 seconds.  Neurological:     Mental Status: She is alert and oriented to person, place, and time.  Psychiatric:        Mood and Affect: Mood normal.        Behavior: Behavior normal.     (all labs ordered are listed, but only abnormal results are displayed) Labs Reviewed - No data to display  EKG: None  Radiology: No results found.   Procedures   Medications Ordered in the ED  lidocaine  (LIDODERM ) 5 % 2 patch (2 patches Transdermal Patch Applied 05/11/24 0540)  methocarbamol  (ROBAXIN ) tablet 500 mg (500 mg Oral Given 05/11/24 0857)  traMADol  (ULTRAM ) tablet 50 mg (50 mg Oral Given 05/11/24 0857)                                    Medical Decision Making 78 year old female presenting emergency department with neck pain.  She is afebrile nontachycardic, slightly hypertensive.  Physical exam reassuring with out any evidence of neurodeficits or acute cord compression.  She has no fever or tachycardia and no midline spinal tenderness.  Low suspicion for spinal epidural abscess.  Given home dose of her tramadol  as well as Robaxin .  Lidocaine  patch.  Given Tylenol  by EMS.  She has follow-up later this week with her primary doctor who is treating her lumbar back pain.  Will discharge in stable condition with short course of muscle relaxer.  Risk Prescription drug management.      Final diagnoses:  None    ED Discharge Orders     None          Neysa Caron PARAS, [place] 05/11/24 [phone]

## 2024-05-20 ENCOUNTER — Telehealth: Payer: Self-pay

## 2024-05-20 NOTE — Telephone Encounter (Signed)
 Stress Test Results reviewed with pt as per Dr. Karry note.  Pt verbalized understanding and had no additional questions. Routed to PCP

## 2024-06-04 ENCOUNTER — Other Ambulatory Visit: Payer: Self-pay | Admitting: Family Medicine

## 2024-06-04 DIAGNOSIS — G6289 Other specified polyneuropathies: Secondary | ICD-10-CM

## 2024-06-17 ENCOUNTER — Other Ambulatory Visit: Payer: Self-pay | Admitting: Neurological Surgery

## 2024-07-01 ENCOUNTER — Ambulatory Visit: Admitting: Cardiology

## 2024-07-14 DIAGNOSIS — Z23 Encounter for immunization: Secondary | ICD-10-CM | POA: Diagnosis not present

## 2024-07-18 ENCOUNTER — Ambulatory Visit
Admission: RE | Admit: 2024-07-18 | Discharge: 2024-07-18 | Disposition: A | Discharge status: Home / Self Care | Attending: Internal Medicine | Admitting: Internal Medicine

## 2024-07-18 VITALS — BP 127/83 | HR 106 | Temp 98.1°F | Resp 17

## 2024-07-18 DIAGNOSIS — R3915 Urgency of urination: Secondary | ICD-10-CM

## 2024-07-18 DIAGNOSIS — R350 Frequency of micturition: Secondary | ICD-10-CM

## 2024-07-18 LAB — POCT URINE DIPSTICK
Bilirubin, UA: NEGATIVE
Blood, UA: NEGATIVE
Glucose, UA: NEGATIVE mg/dL
Ketones, POC UA: NEGATIVE mg/dL
Leukocytes, UA: NEGATIVE
Nitrite, UA: POSITIVE — AB
POC PROTEIN,UA: 30 — AB
Spec Grav, UA: >=1.03 — AB (ref 1.010–1.025)
Urobilinogen, UA: 0.2 U/dL
pH, UA: 5.5 (ref 5.0–8.0)

## 2024-07-18 MED ORDER — CEPHALEXIN 500 MG PO CAPS: 500.0000 mg | ORAL_CAPSULE | Freq: Two times a day (BID) | ORAL | 0 refills | Status: AC

## 2024-07-18 NOTE — ED Triage Notes (Signed)
 Pt c/o urinary frequency since last night. Hx of UTIs.

## 2024-07-18 NOTE — Discharge Instructions (Signed)
 I have prescribed you an antibiotic given concern of urinary tract infection.  Urine culture is pending.  We will call with the results.  Follow-up if any symptoms persist or worsen.
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
## 2024-07-21 ENCOUNTER — Ambulatory Visit: Attending: Cardiology | Admitting: Cardiology

## 2024-07-21 ENCOUNTER — Ambulatory Visit (HOSPITAL_COMMUNITY): Payer: Self-pay

## 2024-07-21 LAB — URINE CULTURE: Culture: >=100000 — AB

## 2024-07-21 NOTE — Telephone Encounter (Signed)
 Keflex  will be fine.  F/u as needed for unresolved sx.

## 2024-08-22 ENCOUNTER — Inpatient Hospital Stay: Admit: 2024-08-22 | Admitting: Neurological Surgery

## 2024-08-22 SURGERY — POSTERIOR LUMBAR FUSION 2 LEVEL
Anesthesia: General | Site: Back

## 2024-08-25 ENCOUNTER — Telehealth: Payer: Self-pay | Admitting: Family Medicine

## 2024-08-25 DIAGNOSIS — G6289 Other specified polyneuropathies: Secondary | ICD-10-CM

## 2024-08-28 MED ORDER — TRAMADOL HCL 50 MG PO TABS: 50.0000 mg | ORAL_TABLET | Freq: Three times a day (TID) | ORAL | 1 refills | Status: DC | PRN

## 2024-08-28 NOTE — Addendum Note (Signed)
 Addended by: [name] on: 08/28/2024 06:53 PM   Modules accepted: Orders

## 2024-08-28 NOTE — Telephone Encounter (Signed)
 Copied from CRM #[id number]. Topic: Clinical - Prescription Issue >> Aug 28, 2024 12:24 PM Deleta RAMAN wrote: Reason for CRM: patient traMADol  (ULTRAM ) 50 MG tablet was refilled on 1/6 pharmacy states they do not have the prescription. Please follow up with the patient

## 2024-08-28 NOTE — Telephone Encounter (Signed)
 Can you re-send please
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
## 2024-09-03 ENCOUNTER — Ambulatory Visit: Admitting: Family Medicine

## 2024-09-03 DIAGNOSIS — E782 Mixed hyperlipidemia: Secondary | ICD-10-CM

## 2024-09-03 DIAGNOSIS — E039 Hypothyroidism, unspecified: Secondary | ICD-10-CM
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
## 2024-09-11 ENCOUNTER — Ambulatory Visit: Admitting: Family Medicine

## 2024-09-11 ENCOUNTER — Encounter: Payer: Self-pay | Admitting: Family Medicine

## 2024-09-11 VITALS — BP 128/82 | HR 90 | Ht 66.5 in | Wt 290.6 lb

## 2024-09-11 DIAGNOSIS — F341 Dysthymic disorder: Secondary | ICD-10-CM

## 2024-09-11 DIAGNOSIS — Z131 Encounter for screening for diabetes mellitus: Secondary | ICD-10-CM | POA: Diagnosis not present

## 2024-09-11 DIAGNOSIS — G6289 Other specified polyneuropathies: Secondary | ICD-10-CM | POA: Diagnosis not present

## 2024-09-11 DIAGNOSIS — E782 Mixed hyperlipidemia: Secondary | ICD-10-CM

## 2024-09-11 DIAGNOSIS — I10 Essential (primary) hypertension: Secondary | ICD-10-CM

## 2024-09-11 DIAGNOSIS — E039 Hypothyroidism, unspecified: Secondary | ICD-10-CM

## 2024-09-11 DIAGNOSIS — H04123 Dry eye syndrome of bilateral lacrimal glands: Secondary | ICD-10-CM

## 2024-09-11 DIAGNOSIS — Z130 Encounter for screening for diseases of the blood and blood-forming organs and certain disorders involving the immune mechanism: Secondary | ICD-10-CM

## 2024-09-11 DIAGNOSIS — G4710 Hypersomnia, unspecified: Secondary | ICD-10-CM | POA: Diagnosis not present

## 2024-09-11 LAB — CBC
HCT: 40.5 % (ref 36.0–46.0)
Hemoglobin: 13.5 g/dL (ref 12.0–15.0)
MCHC: 33.4 g/dL (ref 30.0–36.0)
MCV: 93 fl (ref 78.0–100.0)
Platelets: 238 K/uL (ref 150.0–400.0)
RBC: 4.35 Mil/uL (ref 3.87–5.11)
RDW: 13.4 % (ref 11.5–15.5)
WBC: 7 K/uL (ref 4.0–10.5)

## 2024-09-11 LAB — LIPID PANEL
Cholesterol: 146 mg/dL (ref 28–200)
HDL: 50 mg/dL
LDL Cholesterol: 62 mg/dL (ref 10–99)
NonHDL: 95.93
Total CHOL/HDL Ratio: 3
Triglycerides: 168 mg/dL — ABNORMAL HIGH (ref 10.0–149.0)
VLDL: 33.6 mg/dL (ref 0.0–40.0)

## 2024-09-11 LAB — COMPREHENSIVE METABOLIC PANEL WITH GFR
ALT: 22 U/L (ref 3–35)
AST: 29 U/L (ref 5–37)
Albumin: 4.2 g/dL (ref 3.5–5.2)
Alkaline Phosphatase: 131 U/L — ABNORMAL HIGH (ref 39–117)
BUN: 14 mg/dL (ref 6–23)
CO2: 38 meq/L — ABNORMAL HIGH (ref 19–32)
Calcium: 10 mg/dL (ref 8.4–10.5)
Chloride: 94 meq/L — ABNORMAL LOW (ref 96–112)
Creatinine, Ser: 0.85 mg/dL (ref 0.40–1.20)
GFR: 65.56 mL/min
Glucose, Bld: 116 mg/dL — ABNORMAL HIGH (ref 70–99)
Potassium: 3.9 meq/L (ref 3.5–5.1)
Sodium: 139 meq/L (ref 135–145)
Total Bilirubin: 0.4 mg/dL (ref 0.2–1.2)
Total Protein: 7 g/dL (ref 6.0–8.3)

## 2024-09-11 LAB — HEMOGLOBIN A1C: Hgb A1c MFr Bld: 6 % (ref 4.6–6.5)

## 2024-09-11 LAB — TSH: TSH: 8.41 u[IU]/mL — ABNORMAL HIGH (ref 0.35–5.50)

## 2024-09-11 MED ORDER — VENLAFAXINE HCL 50 MG PO TABS: 50.0000 mg | ORAL_TABLET | Freq: Two times a day (BID) | ORAL | 3 refills | Status: AC

## 2024-09-11 MED ORDER — MODAFINIL 100 MG PO TABS: 100.0000 mg | ORAL_TABLET | Freq: Every day | ORAL | 3 refills | Status: AC

## 2024-09-11 MED ORDER — TRAMADOL HCL 50 MG PO TABS: 50.0000 mg | ORAL_TABLET | Freq: Three times a day (TID) | ORAL | 1 refills | Status: AC | PRN

## 2024-09-11 NOTE — Patient Instructions (Signed)
 We will try adding modafanil for your mood and energy level- let me know how this works for you

## 2024-09-15 ENCOUNTER — Telehealth: Payer: Self-pay

## 2024-09-15 ENCOUNTER — Other Ambulatory Visit (HOSPITAL_COMMUNITY): Payer: Self-pay

## 2024-09-15 NOTE — Telephone Encounter (Signed)
 Pharmacy Patient Advocate Encounter   Received notification from Physician's Office that prior authorization for Modafinil  100MG  tablets  is required/requested.   Insurance verification completed.   The patient is insured through [employer].   Per test claim: PA required; PA submitted to above mentioned insurance via Latent Key/confirmation #/EOC [id number] Status is pending

## 2024-09-15 NOTE — Telephone Encounter (Signed)
 Needs PA for modafinil  please

## 2024-09-16 ENCOUNTER — Other Ambulatory Visit (HOSPITAL_COMMUNITY): Payer: Self-pay

## 2024-09-16 ENCOUNTER — Telehealth: Payer: Self-pay

## 2024-09-16 NOTE — Telephone Encounter (Signed)
 Copied from CRM #[id number]. Topic: General - Other >> Sep 16, 2024  2:56 PM Kevelyn M wrote: Reason for CRM: Patient called in regards to new medication. Read to patient Dr. Loetta message. Patient stated she did not get an after visit summary her last visit. Please call patient back so that she can relay how she's been taking her Thyroid  medication.   Call back #[phone]

## 2024-09-16 NOTE — Telephone Encounter (Signed)
 Pharmacy Patient Advocate Encounter  Received notification from [employer] that Prior Authorization for Modafinil  100MG  tablets  has been APPROVED from 08/16/2024 to 09/15/2025. Ran test claim, Copay is $10.13. This test claim was processed through [facility]- copay amounts may vary at other pharmacies due to pharmacy/plan contracts, or as the patient moves through the different stages of their insurance plan.   PA #/Case ID/Reference #: [id number]

## 2024-09-18 ENCOUNTER — Telehealth: Payer: Self-pay | Admitting: *Deleted

## 2024-09-18 DIAGNOSIS — E039 Hypothyroidism, unspecified: Secondary | ICD-10-CM

## 2024-09-18 MED ORDER — LEVOTHYROXINE SODIUM 175 MCG PO TABS: 175.0000 ug | ORAL_TABLET | Freq: Every day | ORAL | 0 refills | Status: AC

## 2024-09-18 NOTE — Telephone Encounter (Signed)
 I called patient and left detailed message on her machine.  I was waiting to hear back from her about her recent use of levothyroxine -making sure she had not run out or been missing doses.  I reached out to her on MyChart but looks like she did not read the message.  I asked her to please let me know if she does not use her MyChart account in which case I can turn it off to avoid confusion.  Otherwise assuming she has been using her levothyroxine  regularly I will send a prescription for 175 mcg.  I asked her to please schedule lab visit only in about 6 weeks to recheck TSH

## 2024-09-18 NOTE — Telephone Encounter (Signed)
 Copied from CRM #[id number]. Topic: Clinical - Medication Refill >> Sep 18, 2024  3:12 PM Berwyn MATSU wrote: Medication: levothyroxine  (SYNTHROID ) 150 MCG tablet (patient states the dosage should of been change but no new prescription has been sent over. )   Has the patient contacted their pharmacy? Yes (Agent: If no, request that the patient contact the pharmacy for the refill. If patient does not wish to contact the pharmacy document the reason why and proceed with request.) (Agent: If yes, when and what did the pharmacy advise?)  This is the patient's preferred pharmacy:  [id number] - [place], [place] - [address] [facility] [address] [place] [address] Phone: [phone] Fax: [phone]  Is this the correct pharmacy for this prescription? Yes If no, delete pharmacy and type the correct one.   Has the prescription been filled recently? Yes  Is the patient out of the medication? No  Has the patient been seen for an appointment in the last year OR does the patient have an upcoming appointment? Yes  Can we respond through MyChart? Yes  Agent: Please be advised that Rx refills may take up to 3 business days. We ask that you follow-up with your pharmacy.

## 2024-09-18 NOTE — Telephone Encounter (Signed)
 You were going to increase her dose but nothing was sent in.

## 2024-09-18 NOTE — Telephone Encounter (Signed)
 Called pt and left a VM asking pt to give the office a call back.

## 2024-09-19 NOTE — Telephone Encounter (Signed)
 Spoke with patient and she stated that she takes the 150mcg daily.  Advised her to pickup the 175mcg.  Also pt was scheduled for 10/27/24 for lab appointment for recheck.  Also she stated that her phone has been messing up and unable to get into her mychart.  She does not want to deactivate because she still gets text messages about her appointments.  Advised that she should get someone to look at her phone.

## 2024-09-19 NOTE — Telephone Encounter (Signed)
 Left message on machine to call back

## 2024-09-22 NOTE — Telephone Encounter (Signed)
 Called [facility] Was transferred to the local pharmacy twice and call was picked up and disconnected.  They stated that Levothyroxine  is ready but insurance issue with Modafinil , did confirm it was 30 pills. Need to contact pharmacy.  Copied from CRM #[id number]. Topic: Clinical - Prescription Issue >> Sep 22, 2024  1:10 PM Rea ORN wrote: Reason for CRM: Pt called to advise there is an issue with the following rx: modafinil  (PROVIGIL ) 100 MG levothyroxine  (SYNTHROID ) 175 MCG tablet  Pt stated the pharmacy has not dispensed either to her. They said the rx for modafinil  was written for one tablet and they told her they already dispensed it. They said the rx for levothyroxine  is too soon, even though the rx is for a dose increase from 150 mcg to 175 mcg.   Please call back to advise,  [phone]

## 2024-09-23 NOTE — Telephone Encounter (Signed)
 See other messages

## 2024-09-23 NOTE — Telephone Encounter (Signed)
 Spok with pharmacist and they just needed to order the levothyroxine  and run the modafinil  through insurance.  Called and notified patient of what's going on and advised to call me back if anything else is needed.

## 2024-10-27 ENCOUNTER — Other Ambulatory Visit

## 2024-10-31 ENCOUNTER — Ambulatory Visit

## 2024-11-27 ENCOUNTER — Ambulatory Visit: Payer: Self-pay | Admitting: Obstetrics and Gynecology
